# Patient Record
Sex: Female | Born: 1940 | Race: White | Hispanic: No | Marital: Married | Smoking: Current some day smoker
Health system: Southern US, Community
[De-identification: ages and names within clinical notes are randomized; demographics above are authoritative.]

## PROBLEM LIST (undated history)

## (undated) DIAGNOSIS — J309 Allergic rhinitis, unspecified: Secondary | ICD-10-CM

## (undated) DIAGNOSIS — T7840XA Allergy, unspecified, initial encounter: Secondary | ICD-10-CM

## (undated) DIAGNOSIS — K219 Gastro-esophageal reflux disease without esophagitis: Secondary | ICD-10-CM

## (undated) DIAGNOSIS — F419 Anxiety disorder, unspecified: Secondary | ICD-10-CM

## (undated) DIAGNOSIS — M1990 Unspecified osteoarthritis, unspecified site: Secondary | ICD-10-CM

## (undated) DIAGNOSIS — E785 Hyperlipidemia, unspecified: Secondary | ICD-10-CM

## (undated) DIAGNOSIS — B029 Zoster without complications: Secondary | ICD-10-CM

## (undated) DIAGNOSIS — I10 Essential (primary) hypertension: Secondary | ICD-10-CM

## (undated) HISTORY — DX: Hyperlipidemia, unspecified: E78.5

## (undated) HISTORY — PX: POLYPECTOMY: SHX149

## (undated) HISTORY — DX: Unspecified osteoarthritis, unspecified site: M19.90

## (undated) HISTORY — PX: CARDIAC CATHETERIZATION: SHX172

## (undated) HISTORY — DX: Zoster without complications: B02.9

## (undated) HISTORY — DX: Gastro-esophageal reflux disease without esophagitis: K21.9

## (undated) HISTORY — DX: Allergy, unspecified, initial encounter: T78.40XA

## (undated) HISTORY — DX: Essential (primary) hypertension: I10

## (undated) HISTORY — DX: Anxiety disorder, unspecified: F41.9

---

## 1985-10-26 HISTORY — PX: TOTAL ABDOMINAL HYSTERECTOMY: SHX209

## 1999-10-27 HISTORY — PX: BREAST LUMPECTOMY: SHX2

## 2004-10-26 HISTORY — PX: CORONARY ARTERY BYPASS GRAFT: SHX141

## 2005-07-22 ENCOUNTER — Emergency Department (HOSPITAL_COMMUNITY): Admission: EM | Admit: 2005-07-22 | Discharge: 2005-07-22 | Payer: Self-pay | Admitting: Emergency Medicine

## 2007-10-27 HISTORY — PX: BUNIONECTOMY: SHX129

## 2009-10-21 ENCOUNTER — Encounter: Admission: RE | Admit: 2009-10-21 | Discharge: 2009-10-21 | Payer: Self-pay | Admitting: General Surgery

## 2009-10-26 DIAGNOSIS — C801 Malignant (primary) neoplasm, unspecified: Secondary | ICD-10-CM

## 2009-10-26 HISTORY — DX: Malignant (primary) neoplasm, unspecified: C80.1

## 2009-10-30 ENCOUNTER — Encounter: Admission: RE | Admit: 2009-10-30 | Discharge: 2009-10-30 | Payer: Self-pay | Admitting: General Surgery

## 2009-11-07 ENCOUNTER — Encounter: Admission: RE | Admit: 2009-11-07 | Discharge: 2009-11-07 | Payer: Self-pay | Admitting: General Surgery

## 2009-11-07 ENCOUNTER — Ambulatory Visit (HOSPITAL_BASED_OUTPATIENT_CLINIC_OR_DEPARTMENT_OTHER): Admission: RE | Admit: 2009-11-07 | Discharge: 2009-11-07 | Payer: Self-pay | Admitting: General Surgery

## 2009-11-07 HISTORY — PX: BREAST LUMPECTOMY: SHX2

## 2009-11-19 ENCOUNTER — Ambulatory Visit: Payer: Self-pay | Admitting: Hematology and Oncology

## 2011-01-11 LAB — POCT I-STAT, CHEM 8
BUN: 16 mg/dL (ref 6–23)
Hemoglobin: 13.6 g/dL (ref 12.0–15.0)
TCO2: 25 mmol/L (ref 0–100)

## 2011-01-11 LAB — GLUCOSE, CAPILLARY: Glucose-Capillary: 86 mg/dL (ref 70–99)

## 2013-03-27 ENCOUNTER — Encounter: Payer: Self-pay | Admitting: Internal Medicine

## 2013-03-27 DIAGNOSIS — D0590 Unspecified type of carcinoma in situ of unspecified breast: Secondary | ICD-10-CM

## 2013-03-27 DIAGNOSIS — Z79811 Long term (current) use of aromatase inhibitors: Secondary | ICD-10-CM

## 2017-10-26 HISTORY — PX: COLONOSCOPY: SHX174

## 2019-02-08 ENCOUNTER — Telehealth: Payer: Self-pay | Admitting: Gastroenterology

## 2019-02-08 NOTE — Telephone Encounter (Signed)
Dr. Rush Landmark, pt requested you to perform her colonoscopy because her husband, Jenny Reichmann is also your patient.  Records from pt's previous colonoscopy in 2019 will be sent to you for review.

## 2019-02-09 NOTE — Telephone Encounter (Signed)
I have reviewed the patient's documentation/referral.  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  The cecum was identified by the appendiceal orifice and ileocecal valve.  The overall preparation of the colon was good.  The procedure was somewhat difficult due to sharp angulation in the sigmoid colon.  However with gentle pressure this area was able to be new initiated.  In the rectum there were internal hemorrhoids.  In addition to diminutive polyps removed with biopsy forceps were removed from the rectum.  2 similar polyps were removed from the distal sigmoid colon.  Slightly proximal to this area in the sigmoid colon at approximately 23 cm there was an area of irregularity and increased vascularity.  It was not clear if this was a polyp so the area proximal to this was injected with saline to raise the lesion.  It was still not clear that this was a polyp so biopsies were taken.  The descending/transverse/a sending colon were normal.  The cecum was normal.  12/18 Cologuard positive test  Based on the patient's findings it is not clear to me what the size of this area is however at 23 cm where the patient had biopsies obtained there was both hyperplastic and adenomatous polyp.  As such I would like to reperform a colonoscopy on this patient in the next 3 to 6 months to evaluate the region and potentially proceed with polypectomy.  Is not clear to me that she would need to have an advanced polyp resection in the hospital but we will plan to proceed with an [place] colonoscopy in this time.  For a 60-minute slot.  If it is determined that the patient needs to have a larger resection then I will schedule as an EMR for a 90-minute slot in the hospital thereafter but we will proceed  with her being done in the [facility].

## 2019-04-27 NOTE — Telephone Encounter (Signed)
Called pt twice to attempt to schedule--pt reported that her husband just had surgery and requested to schedule later in the year.  We will call pt back in August.

## 2019-05-26 NOTE — Telephone Encounter (Signed)
Patient does not wish to schedule at the moment.

## 2020-04-09 ENCOUNTER — Encounter: Payer: Self-pay | Admitting: Gastroenterology

## 2020-06-04 ENCOUNTER — Encounter: Payer: Self-pay | Admitting: Gastroenterology

## 2020-06-04 ENCOUNTER — Ambulatory Visit (AMBULATORY_SURGERY_CENTER): Payer: Self-pay

## 2020-06-04 ENCOUNTER — Other Ambulatory Visit: Payer: Self-pay

## 2020-06-04 VITALS — Ht 62.0 in | Wt 152.0 lb

## 2020-06-04 DIAGNOSIS — Z86010 Personal history of colonic polyps: Secondary | ICD-10-CM

## 2020-06-04 NOTE — Progress Notes (Signed)
No egg or soy allergy known to patient  No issues with past sedation with any surgeries or procedures no intubation problems in the past  No FH of Malignant Hyperthermia No diet pills per patient No home 02 use per patient  No blood thinners per patient  Pt denies issues with constipation  No A fib or A flutter  EMMI video to pt or via [place] 19 guidelines implemented in PV today with Pt and RN   Pt is vaccinated for covid.  Pt is a smoker, instructed to not smoke for 3 hours before procedure.    Pt has numerous questions about drinking "that much" liquid, strategies given, instructed pt to start clear liquid diet at 5 pm on 8/22 will also help.  Due to the COVID-19 pandemic we are asking patients to follow these guidelines. Please only bring one care partner. Please be aware that your care partner may wait in the car in the parking lot or if they feel like they will be too hot to wait in the car, they may wait in the lobby on the 4th floor. All care partners are required to wear a mask the entire time (we do not have any that we can provide them), they need to practice social distancing, and we will do a Covid check for all patient's and care partners when you arrive. Also we will check their temperature and your temperature. If the care partner waits in their car they need to stay in the parking lot the entire time and we will call them on their cell phone when the patient is ready for discharge so they can bring the car to the front of the building. Also all patient's will need to wear a mask into building.

## 2020-06-17 ENCOUNTER — Telehealth: Payer: Self-pay | Admitting: Gastroenterology

## 2020-06-17 NOTE — Telephone Encounter (Signed)
Patient having trouble drinking water post Miralax prep.  Recommended she take a break, re-attempt to sip it down in an hour.  Patient agreed.

## 2020-06-17 NOTE — Telephone Encounter (Signed)
Pt is requesting a call back from a nurse to discuss some complications regarding her prep prior to her colonoscopy scheduled for tomorrow am.

## 2020-06-18 ENCOUNTER — Encounter: Payer: Self-pay | Admitting: Gastroenterology

## 2020-06-18 ENCOUNTER — Other Ambulatory Visit: Payer: Self-pay

## 2020-06-18 ENCOUNTER — Ambulatory Visit (AMBULATORY_SURGERY_CENTER): Payer: Medicare Other | Admitting: Gastroenterology

## 2020-06-18 VITALS — BP 128/71 | HR 71 | Temp 96.6°F | Resp 15 | Ht 62.0 in | Wt 152.0 lb

## 2020-06-18 DIAGNOSIS — Z86010 Personal history of colonic polyps: Secondary | ICD-10-CM

## 2020-06-18 DIAGNOSIS — K635 Polyp of colon: Secondary | ICD-10-CM

## 2020-06-18 DIAGNOSIS — K621 Rectal polyp: Secondary | ICD-10-CM

## 2020-06-18 DIAGNOSIS — D128 Benign neoplasm of rectum: Secondary | ICD-10-CM

## 2020-06-18 DIAGNOSIS — D127 Benign neoplasm of rectosigmoid junction: Secondary | ICD-10-CM

## 2020-06-18 DIAGNOSIS — D124 Benign neoplasm of descending colon: Secondary | ICD-10-CM

## 2020-06-18 MED ORDER — SODIUM CHLORIDE 0.9 % IV SOLN: 500.0000 mL | Freq: Once | INTRAVENOUS | Status: DC

## 2020-06-18 NOTE — Progress Notes (Signed)
Called to room to assist during endoscopic procedure.  Patient ID and intended procedure confirmed with present staff. Received instructions for my participation in the procedure from the performing physician.  

## 2020-06-18 NOTE — Progress Notes (Signed)
Pt's states no medical or surgical changes since previsit or office visit. 

## 2020-06-18 NOTE — Op Note (Signed)
[place] Patient Name: Brandi Mcmahon Procedure Date: 06/18/2020 8:46 AM MRN: [id number] Endoscopist: Justice Britain , MD Age: 79 Referring MD:  Date of Birth: May 27, 1941 Gender: Female Account #: [id number] Procedure:                Colonoscopy Indications:              High risk colon cancer surveillance: Personal                            history of colonic polyps Medicines:                Monitored Anesthesia Care Procedure:                Pre-Anesthesia Assessment:                           - Prior to the procedure, a History and Physical                            was performed, and patient medications and                            allergies were reviewed. The patient's tolerance of                            previous anesthesia was also reviewed. The risks                            and benefits of the procedure and the sedation                            options and risks were discussed with the patient.                            All questions were answered, and informed consent                            was obtained. Prior Anticoagulants: The patient has                            taken no previous anticoagulant or antiplatelet                            agents except for aspirin. ASA Grade Assessment:                            III - A patient with severe systemic disease. After                            reviewing the risks and benefits, the patient was                            deemed in satisfactory condition to undergo the  procedure.                           After obtaining informed consent, the colonoscope                            was passed under direct vision. Throughout the                            procedure, the patient's blood pressure, pulse, and                            oxygen saturations were monitored continuously. The                            Colonoscope was introduced through the anus and                             advanced to the 5 cm into the ileum. The                            colonoscopy was performed without difficulty. The                            patient tolerated the procedure. The quality of the                            bowel preparation was adequate. The terminal ileum,                            ileocecal valve, appendiceal orifice, and rectum                            were photographed. Scope In: 9:05:54 AM Scope Out: 9:37:08 AM Scope Withdrawal Time: 0 hours 25 minutes 28 seconds  Total Procedure Duration: 0 hours 31 minutes 14 seconds  Findings:                 The digital rectal exam findings include                            hemorrhoids. Pertinent negatives include no                            palpable rectal lesions.                           The terminal ileum and ileocecal valve appeared                            normal.                           Two sessile polyps were found in the descending  colon. The polyps were 3 to 4 mm in size. These                            polyps were removed with a cold snare. Resection                            and retrieval were complete.                           Many sessile polyps were found in the rectum and                            recto-sigmoid colon - consistent with likely                            hyperplastic polyps. The polyps were 2 to 5 mm in                            size. Five of these polyps were removed with a cold                            snare for sampling purposes. Resection and                            retrieval were complete.                           A 30 mm polyp was found in the recto-sigmoid colon                            (approximately 20 cm from anal canal). The polyp                            was semi-sessile. Resection not attempted due to                            need for hospital-based EMR techniques/tools. Area                            distal to the  polyp was tattooed with an injection                            of Spot (carbon black) for demarcation purposes.                           Non-bleeding non-thrombosed internal hemorrhoids                            were found during retroflexion, during perianal                            exam and during digital exam. The hemorrhoids were  Grade II (internal hemorrhoids that prolapse but                            reduce spontaneously). Complications:            No immediate complications. Estimated Blood Loss:     Estimated blood loss was minimal. Impression:               - Hemorrhoids found on digital rectal exam.                           - The examined portion of the ileum was normal.                           - Two 3 to 4 mm polyps in the descending colon,                            removed with a cold snare. Resected and retrieved.                           - Multiple hyperplastic appearing, 2 to 5 mm polyps                            in the rectum and at the recto-sigmoid colon; five                            were removed with a cold snare. Resected and                            retrieved.                           - One 30 mm polyp at the recto-sigmoid colon needs                            hospital-based EMR. Tattooed distally.                           - Non-bleeding non-thrombosed internal hemorrhoids. Recommendation:           - The patient will be observed post-procedure,                            until all discharge criteria are met.                           - Discharge patient to home.                           - Patient has a contact number available for                            emergencies. The signs and symptoms of potential  delayed complications were discussed with the                            patient. Return to normal activities tomorrow.                            Written discharge instructions were provided  to the                            patient.                           - High fiber diet.                           - Continue present medications.                           - Await pathology results.                           - Proceed with scheduling Flexible sigmoidoscopy                            with EMR attempt in next 2-3 months in                            hospital-based setting for resection attempt.                           - The findings and recommendations were discussed                            with the patient.                           - The findings and recommendations were discussed                            with the patient's family. Justice Britain, MD 06/18/2020 9:49:35 AM

## 2020-06-18 NOTE — Progress Notes (Signed)
To PACU, VSS. Report to [email or web address] 

## 2020-06-18 NOTE — Patient Instructions (Signed)
Please read handout provided. Continue present medications. Await pathology results. High fiber diet.     YOU HAD AN ENDOSCOPIC PROCEDURE TODAY AT [facility]:   Refer to the procedure report that was given to you for any specific questions about what was found during the examination.  If the procedure report does not answer your questions, please call your gastroenterologist to clarify.  If you requested that your care partner not be given the details of your procedure findings, then the procedure report has been included in a sealed envelope for you to review at your convenience later.  YOU SHOULD EXPECT: Some feelings of bloating in the abdomen. Passage of more gas than usual.  Walking can help get rid of the air that was put into your GI tract during the procedure and reduce the bloating. If you had a lower endoscopy (such as a colonoscopy or flexible sigmoidoscopy) you may notice spotting of blood in your stool or on the toilet paper. If you underwent a bowel prep for your procedure, you may not have a normal bowel movement for a few days.  Please Note:  You might notice some irritation and congestion in your nose or some drainage.  This is from the oxygen used during your procedure.  There is no need for concern and it should clear up in a day or so.  SYMPTOMS TO REPORT IMMEDIATELY:   Following lower endoscopy (colonoscopy or flexible sigmoidoscopy):  Excessive amounts of blood in the stool  Significant tenderness or worsening of abdominal pains  Swelling of the abdomen that is new, acute  Fever of 100F or higher   For urgent or emergent issues, a gastroenterologist can be reached at any hour by calling [phone]. Do not use MyChart messaging for urgent concerns.    DIET:  We do recommend a small meal at first, but then you may proceed to your regular diet.  Drink plenty of fluids but you should avoid alcoholic beverages for 24 hours.  ACTIVITY:  You should  plan to take it easy for the rest of today and you should NOT DRIVE or use heavy machinery until tomorrow (because of the sedation medicines used during the test).    FOLLOW UP: Our staff will call the number listed on your records 48-72 hours following your procedure to check on you and address any questions or concerns that you may have regarding the information given to you following your procedure. If we do not reach you, we will leave a message.  We will attempt to reach you two times.  During this call, we will ask if you have developed any symptoms of COVID 19. If you develop any symptoms (ie: fever, flu-like symptoms, shortness of breath, cough etc.) before then, please call [phone].  If you test positive for Covid 19 in the 2 weeks post procedure, please call and report this information to [place].    If any biopsies were taken you will be contacted by phone or by letter within the next 1-3 weeks.  Please call us at [phone] if you have not heard about the biopsies in 3 weeks.    SIGNATURES/CONFIDENTIALITY: You and/or your care partner have signed paperwork which will be entered into your electronic medical record.  These signatures attest to the fact that that the information above on your After Visit Summary has been reviewed and is understood.  Full responsibility of the confidentiality of this discharge information lies with you and/or your care-partner.

## 2020-06-20 ENCOUNTER — Telehealth: Payer: Self-pay

## 2020-06-20 NOTE — Telephone Encounter (Signed)
  Follow up Call-  Call back number 06/18/2020  Post procedure Call Back phone  # [phone]  Permission to leave phone message Yes  Some recent data might be hidden     Left message

## 2020-06-20 NOTE — Telephone Encounter (Signed)
  Follow up Call-  Call back number 06/18/2020  Post procedure Call Back phone  # [phone]  Permission to leave phone message Yes  Some recent data might be hidden     Left Message

## 2020-07-04 ENCOUNTER — Encounter: Payer: Self-pay | Admitting: Gastroenterology

## 2020-09-03 ENCOUNTER — Telehealth: Payer: Self-pay

## 2020-09-03 NOTE — Telephone Encounter (Signed)
Per recall the pt needs to have Colon at the hospital for colon screen polyps with Dr Rush Landmark.  Let Dr Rush Landmark know the date.

## 2020-09-04 ENCOUNTER — Other Ambulatory Visit: Payer: Self-pay

## 2020-09-04 DIAGNOSIS — Z86010 Personal history of colonic polyps: Secondary | ICD-10-CM

## 2020-09-04 NOTE — Telephone Encounter (Signed)
Colon scheduled, pt instructed and medications reviewed.  Patient instructions mailed to home.  Patient to call with any questions or concerns.  

## 2020-09-04 NOTE — Telephone Encounter (Signed)
Left message on machine to call back  

## 2020-09-04 NOTE — Telephone Encounter (Signed)
Pt returning call, please call to sch procedure for hospital

## 2020-09-04 NOTE — Telephone Encounter (Signed)
Colon scheduled for 10/23/20 at 10 am at [facility] with Dr Rush Landmark  COVID test on 12/27

## 2020-10-14 ENCOUNTER — Other Ambulatory Visit: Payer: Self-pay

## 2020-10-14 ENCOUNTER — Encounter (HOSPITAL_COMMUNITY): Payer: Self-pay | Admitting: Gastroenterology

## 2020-10-21 ENCOUNTER — Other Ambulatory Visit (HOSPITAL_COMMUNITY)
Admission: RE | Admit: 2020-10-21 | Discharge: 2020-10-21 | Disposition: A | Discharge status: Home / Self Care | Payer: Medicare Other | Source: Ambulatory Visit | Attending: Gastroenterology | Admitting: Gastroenterology

## 2020-10-21 DIAGNOSIS — Z01812 Encounter for preprocedural laboratory examination: Secondary | ICD-10-CM | POA: Diagnosis present

## 2020-10-21 DIAGNOSIS — Z20822 Contact with and (suspected) exposure to covid-19: Secondary | ICD-10-CM | POA: Diagnosis not present

## 2020-10-21 LAB — SARS CORONAVIRUS 2 (TAT 6-24 HRS): SARS Coronavirus 2: NEGATIVE

## 2020-10-23 ENCOUNTER — Ambulatory Visit (HOSPITAL_COMMUNITY)
Admission: RE | Admit: 2020-10-23 | Discharge: 2020-10-23 | Disposition: A | Discharge status: Home / Self Care | Payer: Medicare Other | Attending: Gastroenterology | Admitting: Gastroenterology

## 2020-10-23 ENCOUNTER — Ambulatory Visit (HOSPITAL_COMMUNITY): Payer: Medicare Other | Admitting: Anesthesiology

## 2020-10-23 ENCOUNTER — Other Ambulatory Visit: Payer: Self-pay

## 2020-10-23 ENCOUNTER — Encounter (HOSPITAL_COMMUNITY)
Admission: RE | Disposition: A | Discharge status: Home / Self Care | Payer: Self-pay | Source: Home / Self Care | Attending: Gastroenterology

## 2020-10-23 ENCOUNTER — Encounter (HOSPITAL_COMMUNITY): Payer: Self-pay | Admitting: Gastroenterology

## 2020-10-23 DIAGNOSIS — Z86010 Personal history of colonic polyps: Secondary | ICD-10-CM

## 2020-10-23 DIAGNOSIS — K635 Polyp of colon: Secondary | ICD-10-CM | POA: Insufficient documentation

## 2020-10-23 DIAGNOSIS — Z853 Personal history of malignant neoplasm of breast: Secondary | ICD-10-CM | POA: Diagnosis not present

## 2020-10-23 DIAGNOSIS — Z79899 Other long term (current) drug therapy: Secondary | ICD-10-CM | POA: Diagnosis not present

## 2020-10-23 DIAGNOSIS — Q438 Other specified congenital malformations of intestine: Secondary | ICD-10-CM | POA: Diagnosis not present

## 2020-10-23 DIAGNOSIS — D127 Benign neoplasm of rectosigmoid junction: Secondary | ICD-10-CM | POA: Diagnosis not present

## 2020-10-23 DIAGNOSIS — K621 Rectal polyp: Secondary | ICD-10-CM | POA: Diagnosis not present

## 2020-10-23 DIAGNOSIS — K5730 Diverticulosis of large intestine without perforation or abscess without bleeding: Secondary | ICD-10-CM | POA: Diagnosis not present

## 2020-10-23 DIAGNOSIS — Z951 Presence of aortocoronary bypass graft: Secondary | ICD-10-CM | POA: Diagnosis not present

## 2020-10-23 DIAGNOSIS — K644 Residual hemorrhoidal skin tags: Secondary | ICD-10-CM | POA: Insufficient documentation

## 2020-10-23 DIAGNOSIS — F17210 Nicotine dependence, cigarettes, uncomplicated: Secondary | ICD-10-CM | POA: Insufficient documentation

## 2020-10-23 DIAGNOSIS — K641 Second degree hemorrhoids: Secondary | ICD-10-CM | POA: Diagnosis not present

## 2020-10-23 HISTORY — PX: SUBMUCOSAL LIFTING INJECTION: SHX6855

## 2020-10-23 HISTORY — PX: POLYPECTOMY: SHX5525

## 2020-10-23 HISTORY — PX: COLONOSCOPY WITH PROPOFOL: SHX5780

## 2020-10-23 HISTORY — PX: SUBMUCOSAL TATTOO INJECTION: SHX6856

## 2020-10-23 HISTORY — PX: HEMOSTASIS CLIP PLACEMENT: SHX6857

## 2020-10-23 SURGERY — COLONOSCOPY WITH PROPOFOL
Anesthesia: Monitor Anesthesia Care

## 2020-10-23 MED ORDER — SODIUM CHLORIDE 0.9 % IV SOLN: INTRAVENOUS | Status: DC

## 2020-10-23 MED ORDER — LACTATED RINGERS IV SOLN: INTRAVENOUS | Status: DC

## 2020-10-23 MED ORDER — SPOT INK MARKER SYRINGE KIT
PACK | SUBMUCOSAL | Status: DC | PRN
Start: 2020-10-23 — End: 2020-10-23
  Administered 2020-10-23: 2 mL via SUBMUCOSAL

## 2020-10-23 MED ORDER — PROPOFOL 500 MG/50ML IV EMUL
INTRAVENOUS | Status: DC | PRN
  Administered 2020-10-23: 50 ug/kg/min via INTRAVENOUS
  Administered 2020-10-23: 50 mg via INTRAVENOUS

## 2020-10-23 MED ORDER — SPOT INK MARKER SYRINGE KIT
PACK | SUBMUCOSAL | Status: AC
  Filled 2020-10-23: qty 5

## 2020-10-23 SURGICAL SUPPLY — 22 items

## 2020-10-23 NOTE — Anesthesia Preprocedure Evaluation (Addendum)
Anesthesia Evaluation  Patient identified by MRN, date of birth, ID band Patient awake    Reviewed: Allergy & Precautions, H&P , NPO status , Patient's Chart, lab work & pertinent test results, reviewed documented beta blocker date and time   Airway Mallampati: I  TM Distance: >3 FB Neck ROM: full    Dental no notable dental hx. (+) Edentulous Upper, Edentulous Lower, Upper Dentures   Pulmonary COPD,  COPD inhaler, Current SmokerPatient did not abstain from smoking.,    Pulmonary exam normal breath sounds clear to auscultation       Cardiovascular Exercise Tolerance: Good hypertension, Pt. on medications negative cardio ROS   Rhythm:regular Rate:Normal     Neuro/Psych PSYCHIATRIC DISORDERS Anxiety negative neurological ROS     GI/Hepatic Neg liver ROS, GERD  Medicated,  Endo/Other  negative endocrine ROS  Renal/GU negative Renal ROS  negative genitourinary   Musculoskeletal  (+) Arthritis , Osteoarthritis,    Abdominal   Peds  Hematology negative hematology ROS (+)   Anesthesia Other Findings   Reproductive/Obstetrics negative OB ROS                            Anesthesia Physical Anesthesia Plan  ASA: III  Anesthesia Plan: MAC   Post-op Pain Management:    Induction:   PONV Risk Score and Plan: 2 and Treatment may vary due to age or medical condition  Airway Management Planned: Nasal Cannula, Natural Airway, Simple Face Mask and Mask  Additional Equipment:   Intra-op Plan:   Post-operative Plan:   Informed Consent: I have reviewed the patients History and Physical, chart, labs and discussed the procedure including the risks, benefits and alternatives for the proposed anesthesia with the patient or authorized representative who has indicated his/her understanding and acceptance.     Dental Advisory Given  Plan Discussed with: CRNA and Anesthesiologist  Anesthesia Plan  Comments:         Anesthesia Quick Evaluation

## 2020-10-23 NOTE — H&P (Signed)
GASTROENTEROLOGY PROCEDURE H&P NOTE   Primary Care Physician: Joya Salm  HPI: Brandi Mcmahon is a 79 y.o. female who presents for Colonoscopy with attempt at polyp resection/mucosal resection.  Past Medical History:  Diagnosis Date  . Allergy    seasonal and environmental  . Anxiety   . Arthritis   . 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 GERD (gastroesophageal reflux disease)   . Hyperlipidemia   . Hypertension    Past Surgical History:  Procedure Laterality Date  . BREAST LUMPECTOMY Left 2001  . BUNIONECTOMY  2009  . CARDIAC CATHETERIZATION    . COLONOSCOPY  2019  . CORONARY ARTERY BYPASS GRAFT  2006  . POLYPECTOMY    . TOTAL ABDOMINAL HYSTERECTOMY  1987   Current Facility-Administered Medications  Medication Dose Route Frequency Provider Last Rate Last Admin  . 0.9 %  sodium chloride infusion   Intravenous Continuous Mansouraty, Netty Starring., MD       Allergies  Allergen Reactions  . Amoxicillin-Pot Clavulanate Other (See Comments)    Unknown ABDONIMAL PAIN   . Aspirin Other (See Comments)    Unknown Other Reaction: Other reaction   . Clavulanic Acid Hives  . Codeine Hives and Other (See Comments)    Unknown   . Alprazolam Other (See Comments)    Unknown OVER SEDATES   . Bee Venom Hives  . Moxifloxacin Hcl In Nacl Rash  . Sulfa Antibiotics Other (See Comments) and Rash    Unknown   . Hydrocodone-Acetaminophen Anxiety and Other (See Comments)    Unknown    Family History  Problem Relation Age of Onset  . Colon cancer Neg Hx   . Colon polyps Neg Hx   . Esophageal cancer Neg Hx   . Rectal cancer Neg Hx   . Stomach cancer Neg Hx    Social History   Socioeconomic History  . Marital status: Married    Spouse name: Not on file  . Number of children: Not on file  . Years of education: Not on file  . Highest education level: Not on file  Occupational History  . Not on file  Tobacco Use  . Smoking  status: Current Some Day Smoker    Types: Cigarettes  . Smokeless tobacco: Never Used  . Tobacco comment: 1-2 per day, trying to quit  Vaping Use  . Vaping Use: Never used  Substance and Sexual Activity  . Alcohol use: Not Currently  . Drug use: Never  . Sexual activity: Not on file  Other Topics Concern  . Not on file  Social History Narrative  . Not on file   Social Determinants of Health   Financial Resource Strain: Not on file  Food Insecurity: Not on file  Transportation Needs: Not on file  Physical Activity: Not on file  Stress: Not on file  Social Connections: Not on file  Intimate Partner Violence: Not on file    Physical Exam: Vital signs in last 24 hours: Temp:  [97.9 F (36.6 C)] 97.9 F (36.6 C) (12/29 0738) Pulse Rate:  [75] 75 (12/29 0738) Resp:  [20] 20 (12/29 0738) BP: (121)/(79) 121/79 (12/29 0738) SpO2:  [98 %] 98 % (12/29 0738) Weight:  [68.9 kg] 68.9 kg (12/29 0738)   GEN: NAD EYE: Sclerae anicteric ENT: MMM CV: Non-tachycardic GI: Soft, NT/ND NEURO:  Alert & Oriented x 3  Lab Results: No results for input(s): WBC, HGB, HCT, PLT in the last 72  hours. BMET No results for input(s): NA, K, CL, CO2, GLUCOSE, BUN, CREATININE, CALCIUM in the last 72 hours. LFT No results for input(s): PROT, ALBUMIN, AST, ALT, ALKPHOS, BILITOT, BILIDIR, IBILI in the last 72 hours. PT/INR No results for input(s): LABPROT, INR in the last 72 hours.   Impression / Plan: This is a 79 y.o.female  who presents for Colonoscopy with attempt at polyp resection/mucosal resection.  Based upon the description and endoscopic pictures I do feel that it is reasonable to pursue an Advanced Polypectomy attempt of the polyp/lesion.  We discussed some of the techniques of advanced polypectomy which include Endoscopic Mucosal Resection, OVESCO Full-Thickness Resection, Endorotor Morcellation, and Tissue Ablation via Fulguration.  We also reviewed images of typical techniques as noted  above.  The risks and benefits of endoscopic evaluation were discussed with the patient; these include but are not limited to the risk of perforation, infection, bleeding, missed lesions, lack of diagnosis, severe illness requiring hospitalization, as well as anesthesia and sedation related illnesses.  During attempts at advanced resection, the risks of bleeding and perforation/leak are increased as opposed to diagnostic and screening procedures, and that was discussed with the patient as well.   In addition, I explained that with the possible need for piecemeal resection, subsequent short-interval endoscopic evaluation for follow up and potential retreatment of the lesion/area may be necessary.  I did offer, a referral to surgery in order for patient to have opportunity to discuss surgical management/intervention prior to finalizing decision for attempt at endoscopic removal, however, the patient deferred on this.  If, after attempt at removal of the polyp/lesion, it is found that the patient has a complication or that an invasive lesion or malignant lesion is found, or that the polyp/lesion continues to recur, the patient is aware and understands that surgery may still be indicated/required.  All patient questions were answered, to the best of my ability, and the patient agrees to the aforementioned plan of action with follow-up as indicated.  The risks and benefits of endoscopic evaluation were discussed with the patient; these include but are not limited to the risk of perforation, infection, bleeding, missed lesions, lack of diagnosis, severe illness requiring hospitalization, as well as anesthesia and sedation related illnesses.  The patient is agreeable to proceed.    Justice Britain, MD [facility] Advanced Endoscopy Office # [phone]

## 2020-10-23 NOTE — Op Note (Signed)
[facility] Patient Name: Brandi Mcmahon Procedure Date: 10/23/2020 MRN: [id number] Attending MD: Justice Britain , MD Date of Birth: [birth year] CSN: [id number] Age: 79 Admit Type: Inpatient Procedure:                Colonoscopy Indications:              Excision of colonic polyp Providers:                Justice Britain, MD, Jeanella Cara, RN,                            Ladona Ridgel, Technician, Eliberto Ivory CRNA Referring MD:             Elyn Aquas MD, MD Medicines:                Monitored Anesthesia Care Complications:            No immediate complications. Estimated Blood Loss:     Estimated blood loss was minimal. Procedure:                Pre-Anesthesia Assessment:                           - Prior to the procedure, a History and Physical                            was performed, and patient medications and                            allergies were reviewed. The patient's tolerance of                            previous anesthesia was also reviewed. The risks                            and benefits of the procedure and the sedation                            options and risks were discussed with the patient.                            All questions were answered, and informed consent                            was obtained. Prior Anticoagulants: The patient has                            taken no previous anticoagulant or antiplatelet                            agents except for aspirin. ASA Grade Assessment:                            III - A patient with severe systemic disease. After  reviewing the risks and benefits, the patient was                            deemed in satisfactory condition to undergo the                            procedure.                           After obtaining informed consent, the colonoscope                            was passed under direct vision. Throughout the                             procedure, the patient's blood pressure, pulse, and                            oxygen saturations were monitored continuously. The                            PCF-H190DL ([id number] Olympus pediatric colonscope                            was introduced through the anus and advanced to the                            5 cm into the ileum. The colonoscopy was somewhat                            difficult due to a tortuous colon. Successful                            completion of the procedure was aided by changing                            the patient's position, using manual pressure,                            withdrawing and reinserting the scope,                            straightening and shortening the scope to obtain                            bowel loop reduction and using scope torsion. The                            patient tolerated the procedure. The quality of the                            bowel preparation was adequate. The terminal ileum,  ileocecal valve, appendiceal orifice, and rectum                            were photographed. Scope In: 9:13:31 AM Scope Out: 9:49:16 AM Scope Withdrawal Time: 0 hours 29 minutes 7 seconds  Total Procedure Duration: 0 hours 35 minutes 45 seconds  Findings:      The digital rectal exam findings include hemorrhoids. Pertinent       negatives include no palpable rectal lesions.      The terminal ileum and ileocecal valve appeared normal.      Multiple sessile polyps were found in the rectum, recto-sigmoid colon,       sigmoid colon and descending colon - consistent with hyperplastic polyps       endoscopically. The polyps were 2 to 5 mm in size. A few of these polyps       were removed with a cold snare for sampling purposes.      A 28 mm polyp was found in the recto-sigmoid colon (between 15-20 cm       from anal verge). The polyp was semi-sessile. Preparations were made for       mucosal resection. NBI imaging  and White-light endoscopy was done to       demarcate the borders of the lesion. Orise gel was injected to raise the       lesion. Piecemeal mucosal resection using a cold snare was performed.       Resection and retrieval were complete. To prevent bleeding after mucosal       resection, five hemostatic clips were successfully placed (MR       conditional). There was no bleeding at the end of the procedure. I could       not visualize the previously placed tattoo, so for marking purposes, the       area just distal to resection was tattooed with an injection of Spot       (carbon black).      Normal mucosa was found in the entire colon otherwise.      Multiple small-mouthed diverticula were found in the recto-sigmoid       colon, sigmoid colon and descending colon.      The right colon was significantly tortuous as a result of diverticular       disease.      Non-bleeding non-thrombosed external and internal hemorrhoids were found       during retroflexion, during perianal exam and during digital exam. The       hemorrhoids were Grade II (internal hemorrhoids that prolapse but reduce       spontaneously). Impression:               - Hemorrhoids found on digital rectal exam.                           - The examined portion of the ileum was normal.                           - Multiple 2 to 5 mm polyps in the rectum, at the                            recto-sigmoid colon, in the sigmoid colon and in  the descending colon - consistent with hyperplastic                            polyps endoscopically, a few were sampled with a                            cold snare.                           - One 28 mm polyp at the recto-sigmoid colon,                            removed with piecemeal cold mucosal resection.                            Resected and retrieved. Clips (MR conditional) were                            placed. Tattooed distally.                           -  Normal mucosa in the entire examined colon                            otherwise.                           - Diverticulosis in the recto-sigmoid colon, in the                            sigmoid colon and in the descending colon leading                            to tortuosity in the right colon.                           - Non-bleeding non-thrombosed external and internal                            hemorrhoids. Moderate Sedation:      Not Applicable - Patient had care per Anesthesia. Recommendation:           - The patient will be observed post-procedure,                            until all discharge criteria are met.                           - Discharge patient to home.                           - Patient has a contact number available for                            emergencies. The signs and symptoms of potential  delayed complications were discussed with the                            patient. Return to normal activities tomorrow.                            Written discharge instructions were provided to the                            patient.                           - High fiber diet.                           - Use FiberCon 1-2 tablets PO daily.                           - No aspirin, ibuprofen, naproxen, or other                            non-steroidal anti-inflammatory drugs for 2 weeks                            after polyp removal to decrease risk of                            post-interventional bleeding.                           - Await pathology results.                           - Repeat colonoscopy in 1 year for surveillance                            after piecemeal polypectomy. If this polyp returns                            as hyperplastic, then we can have discussion as to                            whether repeat evaluation is required (when I                            sampled this polyp the first time it returned as a                             hyperplastic polyp though pit pattern was more                            suggestive of an adenomatous polyp).                           - The findings and recommendations were discussed  with the patient.                           - The findings and recommendations were discussed                            with the patient's family. Procedure Code(s):        --- Professional ---                           [phone], Colonoscopy, flexible; with endoscopic                            mucosal resection                           45385, 26, Colonoscopy, flexible; with removal of                            tumor(s), polyp(s), or other lesion(s) by snare                            technique Diagnosis Code(s):        --- Professional ---                           K64.1, Second degree hemorrhoids                           K62.1, Rectal polyp                           K63.5, Polyp of colon                           K57.30, Diverticulosis of large intestine without                            perforation or abscess without bleeding                           Q43.8, Other specified congenital malformations of                            intestine CPT copyright 2019 [employer]. All rights reserved. The codes documented in this report are preliminary and upon coder review may  be revised to meet current compliance requirements. Justice Britain, MD 10/23/2020 10:09:05 AM Number of Addenda: 0

## 2020-10-23 NOTE — Transfer of Care (Signed)
Immediate Anesthesia Transfer of Care Note  Patient: Brandi Mcmahon  Procedure(s) Performed: Procedure(s): COLONOSCOPY WITH PROPOFOL (N/A) POLYPECTOMY SUBMUCOSAL LIFTING INJECTION HEMOSTASIS CLIP PLACEMENT SUBMUCOSAL TATTOO INJECTION  Patient Location: PACU and Endoscopy Unit  Anesthesia Type:MAC  Level of Consciousness: awake, alert  and oriented  Airway & Oxygen Therapy: Patient Spontanous Breathing and Patient connected to nasal cannula oxygen  Post-op Assessment: Report given to RN and Post -op Vital signs reviewed and stable  Post vital signs: Reviewed and stable  Last Vitals:  Vitals:   10/23/20 0738  BP: 121/79  Pulse: 75  Resp: 20  Temp: 36.6 C  SpO2: 05%    Complications: No apparent anesthesia complications

## 2020-10-23 NOTE — Discharge Instructions (Signed)
YOU HAD AN ENDOSCOPIC PROCEDURE TODAY: Refer to the procedure report and other information in the discharge instructions given to you for any specific questions about what was found during the examination. If this information does not answer your questions, please call [place] office at [phone] to clarify.  ° °YOU SHOULD EXPECT: Some feelings of bloating in the abdomen. Passage of more gas than usual. Walking can help get rid of the air that was put into your GI tract during the procedure and reduce the bloating. If you had a lower endoscopy (such as a colonoscopy or flexible sigmoidoscopy) you may notice spotting of blood in your stool or on the toilet paper. Some abdominal soreness may be present for a day or two, also. ° °DIET: Your first meal following the procedure should be a light meal and then it is ok to progress to your normal diet. A half-sandwich or bowl of soup is an example of a good first meal. Heavy or fried foods are harder to digest and may make you feel nauseous or bloated. Drink plenty of fluids but you should avoid alcoholic beverages for 24 hours. If you had a esophageal dilation, please see attached instructions for diet.   ° °ACTIVITY: Your care partner should take you home directly after the procedure. You should plan to take it easy, moving slowly for the rest of the day. You can resume normal activity the day after the procedure however YOU SHOULD NOT DRIVE, use power tools, machinery or perform tasks that involve climbing or major physical exertion for 24 hours (because of the sedation medicines used during the test).  ° °SYMPTOMS TO REPORT IMMEDIATELY: °A gastroenterologist can be reached at any hour. Please call [phone]  for any of the following symptoms:  °Following lower endoscopy (colonoscopy, flexible sigmoidoscopy) °Excessive amounts of blood in the stool  °Significant tenderness, worsening of abdominal pains  °Swelling of the abdomen that is new, acute  °Fever of 100° or  higher  ° °Black, tarry-looking or red, bloody stools ° °FOLLOW UP:  °If any biopsies were taken you will be contacted by phone or by letter within the next 1-3 weeks. Call [phone]  if you have not heard about the biopsies in 3 weeks.  °Please also call with any specific questions about appointments or follow up tests.  °

## 2020-10-23 NOTE — Anesthesia Postprocedure Evaluation (Signed)
Anesthesia Post Note  Patient: Brandi Mcmahon  Procedure(s) Performed: COLONOSCOPY WITH PROPOFOL (N/A ) POLYPECTOMY SUBMUCOSAL LIFTING INJECTION HEMOSTASIS CLIP PLACEMENT SUBMUCOSAL TATTOO INJECTION     Patient location during evaluation: PACU Anesthesia Type: MAC Level of consciousness: awake and alert Pain management: pain level controlled Vital Signs Assessment: post-procedure vital signs reviewed and stable Respiratory status: spontaneous breathing, nonlabored ventilation, respiratory function stable and patient connected to nasal cannula oxygen Cardiovascular status: stable and blood pressure returned to baseline Postop Assessment: no apparent nausea or vomiting Anesthetic complications: no   No complications documented.  Last Vitals:  Vitals:   10/23/20 0957 10/23/20 1000  BP: (!) 133/45 (!) 128/50  Pulse: 75 72  Resp: 19 20  Temp: 36.4 C   SpO2: 98% 99%    Last Pain:  Vitals:   10/23/20 1000  TempSrc:   PainSc: 0-No pain                 [name]

## 2020-10-24 LAB — SURGICAL PATHOLOGY

## 2020-10-25 ENCOUNTER — Encounter (HOSPITAL_COMMUNITY): Payer: Self-pay | Admitting: Gastroenterology

## 2020-10-27 ENCOUNTER — Encounter: Payer: Self-pay | Admitting: Gastroenterology

## 2021-05-16 ENCOUNTER — Other Ambulatory Visit: Payer: Self-pay | Admitting: Neurosurgery

## 2021-06-05 ENCOUNTER — Other Ambulatory Visit (HOSPITAL_COMMUNITY): Payer: Medicare Other

## 2021-06-12 ENCOUNTER — Other Ambulatory Visit: Payer: Self-pay | Admitting: Neurosurgery

## 2021-06-12 ENCOUNTER — Encounter (HOSPITAL_COMMUNITY): Payer: Self-pay

## 2021-06-12 ENCOUNTER — Encounter (HOSPITAL_COMMUNITY)
Admission: RE | Admit: 2021-06-12 | Discharge: 2021-06-12 | Disposition: A | Discharge status: Home / Self Care | Payer: Medicare Other | Source: Ambulatory Visit | Attending: Neurosurgery | Admitting: Neurosurgery

## 2021-06-12 ENCOUNTER — Other Ambulatory Visit: Payer: Self-pay

## 2021-06-12 DIAGNOSIS — Z01818 Encounter for other preprocedural examination: Secondary | ICD-10-CM | POA: Diagnosis not present

## 2021-06-12 HISTORY — DX: Allergic rhinitis, unspecified: J30.9

## 2021-06-12 LAB — BASIC METABOLIC PANEL
Anion gap: 11 (ref 5–15)
BUN: 21 mg/dL (ref 8–23)
CO2: 23 mmol/L (ref 22–32)
Calcium: 9.7 mg/dL (ref 8.9–10.3)
Chloride: 100 mmol/L (ref 98–111)
Creatinine, Ser: 0.84 mg/dL (ref 0.44–1.00)
GFR, Estimated: >60 mL/min (ref >60)
Glucose, Bld: 97 mg/dL (ref 70–99)
Potassium: 4.5 mmol/L (ref 3.5–5.1)
Sodium: 134 mmol/L — ABNORMAL LOW (ref 135–145)

## 2021-06-12 LAB — SURGICAL PCR SCREEN
MRSA, PCR: NEGATIVE
Staphylococcus aureus: NEGATIVE

## 2021-06-12 LAB — CBC
HCT: 43.2 % (ref 36.0–46.0)
Hemoglobin: 14.6 g/dL (ref 12.0–15.0)
MCH: 31.9 pg (ref 26.0–34.0)
MCHC: 33.8 g/dL (ref 30.0–36.0)
MCV: 94.3 fL (ref 80.0–100.0)
Platelets: 236 10*3/uL (ref 150–400)
RBC: 4.58 MIL/uL (ref 3.87–5.11)
RDW: 13.8 % (ref 11.5–15.5)
WBC: 9.7 10*3/uL (ref 4.0–10.5)
nRBC: 0 % (ref 0.0–0.2)

## 2021-06-12 NOTE — Progress Notes (Signed)
PCP - Dr. Julieta Gutting Cardiologist - denies  PPM/ICD - n/a Device Orders - n/a Rep Notified - n/a  Chest x-ray -  EKG - 06/12/21 Stress Test - denies ECHO - denies Cardiac Cath - 2004 per patient at [place] - denies CPAP - n/a  Fasting Blood Sugar - n/a Checks Blood Sugar n/a times a day  Blood Thinner Instructions: n/a Aspirin Instructions: n/a  ERAS Protcol - No PRE-SURGERY Ensure or G2- n/a  COVID TEST- Day of surgery. Ok per L. Granville Lewis, Therapist, sports. Patient states she was told to go for a COVID test on 06/12/21 prior to her PAT appointment. Patient made aware that test needs to be 4 days prior to surgery.    Anesthesia review: Yes. Records Requested from PCP Dr. Salli Real  Patient denies shortness of breath, fever, cough and chest pain at PAT appointment   All instructions explained to the patient, with a verbal understanding of the material. Patient agrees to go over the instructions while at home for a better understanding. Patient also instructed to self quarantine after being tested for COVID-19. The opportunity to ask questions was provided.

## 2021-06-12 NOTE — Pre-Procedure Instructions (Addendum)
Surgical Instructions    Your procedure is scheduled on Wednesday 06/18/21.   Report to [facility] Main Entrance "A" at 07:35 A.M., then check in with the Admitting office.  Call this number if you have problems the morning of surgery:  [phone]   If you have any questions prior to your surgery date call [phone]: Open Monday-Friday 8am-4pm    Remember:  Do not eat or drink after midnight the night before your surgery     Take these medicines the morning of surgery with A SIP OF WATER   mometasone (NASONEX)   pantoprazole (PROTONIX)  MYRBETRIQ     Take these medicines if needed:   acetaminophen (TYLENOL)  albuterol (VENTOLIN HFA) 108 (90 Base)  carboxymethylcellulose (REFRESH PLUS)   diazepam (VALIUM)  methocarbamol (ROBAXIN)     As of today, STOP taking any Aspirin (unless otherwise instructed by your surgeon) Aleve, Naproxen, Ibuprofen, Motrin, Advil, Goody's, BC's, all herbal medications, fish oil, and all vitamins.                     Do NOT Smoke (Tobacco/Vaping) or drink Alcohol 24 hours prior to your procedure.  If you use a CPAP at night, you may bring all equipment for your overnight stay.   Contacts, glasses, piercing's, hearing aid's, dentures or partials may not be worn into surgery, please bring cases for these belongings.    For patients admitted to the hospital, discharge time will be determined by your treatment team.   Patients discharged the day of surgery will not be allowed to drive home, and someone needs to stay with them for 24 hours.  ONLY 1 SUPPORT PERSON MAY BE PRESENT WHILE YOU ARE IN SURGERY. IF YOU ARE TO BE ADMITTED ONCE YOU ARE IN YOUR ROOM YOU WILL BE ALLOWED TWO (2) VISITORS.  Minor children may have two parents present. Special consideration for safety and communication needs will be reviewed on a case by case basis.   Special instructions:   - Preparing For Surgery  Before surgery, you can play an important role.  Because skin is not sterile, your skin needs to be as free of germs as possible. You can reduce the number of germs on your skin by washing with CHG (chlorahexidine gluconate) Soap before surgery.  CHG is an antiseptic cleaner which kills germs and bonds with the skin to continue killing germs even after washing.    Oral Hygiene is also important to reduce your risk of infection.  Remember - BRUSH YOUR TEETH THE MORNING OF SURGERY WITH YOUR REGULAR TOOTHPASTE  Please do not use if you have an allergy to CHG or antibacterial soaps. If your skin becomes reddened/irritated stop using the CHG.  Do not shave (including legs and underarms) for at least 48 hours prior to first CHG shower. It is OK to shave your face.  Please follow these instructions carefully.   Shower the NIGHT BEFORE SURGERY and the MORNING OF SURGERY  If you chose to wash your hair, wash your hair first as usual with your normal shampoo.  After you shampoo, rinse your hair and body thoroughly to remove the shampoo.  Use CHG Soap as you would any other liquid soap. You can apply CHG directly to the skin and wash gently with a scrungie or a clean washcloth.   Apply the CHG Soap to your body ONLY FROM THE NECK DOWN.  Do not use on open wounds or open sores. Avoid contact with your  eyes, ears, mouth and genitals (private parts). Wash Face and genitals (private parts)  with your normal soap.   Wash thoroughly, paying special attention to the area where your surgery will be performed.  Thoroughly rinse your body with warm water from the neck down.  DO NOT shower/wash with your normal soap after using and rinsing off the CHG Soap.  Pat yourself dry with a CLEAN TOWEL.  Wear CLEAN PAJAMAS to bed the night before surgery  Place CLEAN SHEETS on your bed the night before your surgery  DO NOT SLEEP WITH PETS.   Day of Surgery: Shower with CHG soap. Do not wear jewelry, make up, nail polish, gel polish, artificial nails, or any  other type of covering on natural nails including finger and toenails. If patients have artificial nails, gel coating, etc. that need to be removed by a nail salon please have this removed prior to surgery. Surgery may need to be canceled/delayed if the surgeon/ anesthesia feels like the patient is unable to be adequately monitored. Do not wear lotions, powders, perfumes/colognes, or deodorant. Do not shave 48 hours prior to surgery.  Men may shave face and neck. Do not bring valuables to the hospital. [facility] is not responsible for any belongings or valuables. Wear Clean/Comfortable clothing the morning of surgery Remember to brush your teeth WITH YOUR REGULAR TOOTHPASTE.   Please read over the following fact sheets that you were given.

## 2021-06-13 ENCOUNTER — Encounter (HOSPITAL_COMMUNITY): Payer: Self-pay

## 2021-06-13 LAB — SARS CORONAVIRUS 2 (TAT 6-24 HRS): SARS Coronavirus 2: NEGATIVE

## 2021-06-13 NOTE — Anesthesia Preprocedure Evaluation (Addendum)
Anesthesia Evaluation  Patient identified by MRN, date of birth, ID band Patient awake    Reviewed: Allergy & Precautions, NPO status , Patient's Chart, lab work & pertinent test results  History of Anesthesia Complications Negative for: history of anesthetic complications  Airway Mallampati: I  TM Distance: >3 FB Neck ROM: Full    Dental  (+) Edentulous Upper, Edentulous Lower   Pulmonary COPD,  COPD inhaler, Current Smoker and Patient abstained from smoking.,  06/16/2021 SARS coronavirus NEG   breath sounds clear to auscultation       Cardiovascular hypertension, Pt. on medications (-) angina Rhythm:Regular Rate:Normal     Neuro/Psych Anxiety Chronic back pain : narcotics    GI/Hepatic Neg liver ROS, GERD  Medicated and Controlled,  Endo/Other  negative endocrine ROS  Renal/GU negative Renal ROS     Musculoskeletal  (+) Arthritis ,   Abdominal   Peds  Hematology negative hematology ROS (+)   Anesthesia Other Findings Breast cancer  Reproductive/Obstetrics                           Anesthesia Physical Anesthesia Plan  ASA: 3  Anesthesia Plan: General   Post-op Pain Management:    Induction: Intravenous  PONV Risk Score and Plan: 2 and Dexamethasone and Ondansetron  Airway Management Planned: Oral ETT  Additional Equipment: None  Intra-op Plan:   Post-operative Plan: Extubation in OR  Informed Consent: I have reviewed the patients History and Physical, chart, labs and discussed the procedure including the risks, benefits and alternatives for the proposed anesthesia with the patient or authorized representative who has indicated his/her understanding and acceptance.     Dental advisory given  Plan Discussed with: CRNA and Surgeon  Anesthesia Plan Comments: (PAT note written 06/13/2021 by Myra Gianotti, PA-C. )      Anesthesia Quick Evaluation
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
## 2021-06-16 ENCOUNTER — Other Ambulatory Visit: Payer: Self-pay | Admitting: Neurosurgery

## 2021-06-16 NOTE — Progress Notes (Signed)
Pt called and stated she had her preop COVID test done today so she will be arriving at 0830 on Wednesday for surgery instead of 0730.

## 2021-06-17 LAB — SARS CORONAVIRUS 2 (TAT 6-24 HRS): SARS Coronavirus 2: NEGATIVE

## 2021-06-18 ENCOUNTER — Ambulatory Visit (HOSPITAL_COMMUNITY): Payer: Medicare Other | Admitting: Vascular Surgery

## 2021-06-18 ENCOUNTER — Other Ambulatory Visit: Payer: Self-pay

## 2021-06-18 ENCOUNTER — Ambulatory Visit (HOSPITAL_COMMUNITY): Payer: Medicare Other

## 2021-06-18 ENCOUNTER — Ambulatory Visit (HOSPITAL_COMMUNITY): Payer: Medicare Other | Admitting: Certified Registered Nurse Anesthetist

## 2021-06-18 ENCOUNTER — Encounter (HOSPITAL_COMMUNITY)
Admission: RE | Disposition: A | Discharge status: Home / Self Care | Payer: Self-pay | Source: Home / Self Care | Attending: Neurosurgery

## 2021-06-18 ENCOUNTER — Encounter (HOSPITAL_COMMUNITY): Payer: Self-pay | Admitting: Neurosurgery

## 2021-06-18 ENCOUNTER — Ambulatory Visit (HOSPITAL_COMMUNITY)
Admission: RE | Admit: 2021-06-18 | Discharge: 2021-06-19 | Disposition: A | Discharge status: Home / Self Care | Payer: Medicare Other | Attending: Neurosurgery | Admitting: Neurosurgery

## 2021-06-18 DIAGNOSIS — Z886 Allergy status to analgesic agent status: Secondary | ICD-10-CM | POA: Insufficient documentation

## 2021-06-18 DIAGNOSIS — Z853 Personal history of malignant neoplasm of breast: Secondary | ICD-10-CM | POA: Diagnosis not present

## 2021-06-18 DIAGNOSIS — F17210 Nicotine dependence, cigarettes, uncomplicated: Secondary | ICD-10-CM | POA: Diagnosis not present

## 2021-06-18 DIAGNOSIS — Z885 Allergy status to narcotic agent status: Secondary | ICD-10-CM | POA: Insufficient documentation

## 2021-06-18 DIAGNOSIS — Z880 Allergy status to penicillin: Secondary | ICD-10-CM | POA: Diagnosis not present

## 2021-06-18 DIAGNOSIS — M7138 Other bursal cyst, other site: Secondary | ICD-10-CM | POA: Diagnosis present

## 2021-06-18 DIAGNOSIS — Z801 Family history of malignant neoplasm of trachea, bronchus and lung: Secondary | ICD-10-CM | POA: Insufficient documentation

## 2021-06-18 DIAGNOSIS — Z803 Family history of malignant neoplasm of breast: Secondary | ICD-10-CM | POA: Diagnosis not present

## 2021-06-18 DIAGNOSIS — E785 Hyperlipidemia, unspecified: Secondary | ICD-10-CM | POA: Diagnosis not present

## 2021-06-18 DIAGNOSIS — I10 Essential (primary) hypertension: Secondary | ICD-10-CM | POA: Insufficient documentation

## 2021-06-18 DIAGNOSIS — Z90710 Acquired absence of both cervix and uterus: Secondary | ICD-10-CM | POA: Diagnosis not present

## 2021-06-18 DIAGNOSIS — Z419 Encounter for procedure for purposes other than remedying health state, unspecified: Secondary | ICD-10-CM

## 2021-06-18 DIAGNOSIS — Z888 Allergy status to other drugs, medicaments and biological substances status: Secondary | ICD-10-CM | POA: Diagnosis not present

## 2021-06-18 DIAGNOSIS — Z79899 Other long term (current) drug therapy: Secondary | ICD-10-CM | POA: Diagnosis not present

## 2021-06-18 DIAGNOSIS — Z882 Allergy status to sulfonamides status: Secondary | ICD-10-CM | POA: Insufficient documentation

## 2021-06-18 HISTORY — PX: LUMBAR LAMINECTOMY/DECOMPRESSION MICRODISCECTOMY: SHX5026

## 2021-06-18 SURGERY — LUMBAR LAMINECTOMY/DECOMPRESSION MICRODISCECTOMY 1 LEVEL
Anesthesia: General | Site: Back | Laterality: Right

## 2021-06-18 MED ORDER — LIDOCAINE-EPINEPHRINE 1 %-1:100000 IJ SOLN
INTRAMUSCULAR | Status: DC | PRN
  Administered 2021-06-18: 5 mL

## 2021-06-18 MED ORDER — MENTHOL 3 MG MT LOZG: 1.0000 | LOZENGE | OROMUCOSAL | Status: DC | PRN

## 2021-06-18 MED ORDER — PANTOPRAZOLE SODIUM 20 MG PO TBEC: 20.0000 mg | DELAYED_RELEASE_TABLET | Freq: Every day | ORAL | Status: DC

## 2021-06-18 MED ORDER — BUPIVACAINE HCL (PF) 0.25 % IJ SOLN
INTRAMUSCULAR | Status: AC
  Filled 2021-06-18: qty 30

## 2021-06-18 MED ORDER — MEPERIDINE HCL 25 MG/ML IJ SOLN: 6.2500 mg | INTRAMUSCULAR | Status: DC | PRN

## 2021-06-18 MED ORDER — ALUM & MAG HYDROXIDE-SIMETH 200-200-20 MG/5ML PO SUSP: 30.0000 mL | Freq: Four times a day (QID) | ORAL | Status: DC | PRN

## 2021-06-18 MED ORDER — ONDANSETRON HCL 4 MG/2ML IJ SOLN
INTRAMUSCULAR | Status: AC
  Filled 2021-06-18: qty 2

## 2021-06-18 MED ORDER — PHENOL 1.4 % MT LIQD: 1.0000 | OROMUCOSAL | Status: DC | PRN

## 2021-06-18 MED ORDER — CAPSAICIN-MENTHOL 0.025-1.25 % EX PTCH: MEDICATED_PATCH | Freq: Every day | CUTANEOUS | Status: DC | PRN

## 2021-06-18 MED ORDER — LORATADINE 10 MG PO TABS
10.0000 mg | ORAL_TABLET | Freq: Every day | ORAL | Status: DC
  Administered 2021-06-18 – 2021-06-19 (×2): 10 mg via ORAL
  Filled 2021-06-18 (×2): qty 1

## 2021-06-18 MED ORDER — ONDANSETRON HCL 4 MG/2ML IJ SOLN: 4.0000 mg | Freq: Four times a day (QID) | INTRAMUSCULAR | Status: DC | PRN

## 2021-06-18 MED ORDER — OXYCODONE HCL 5 MG PO TABS
10.0000 mg | ORAL_TABLET | ORAL | Status: DC | PRN
  Administered 2021-06-18 – 2021-06-19 (×2): 10 mg via ORAL
  Filled 2021-06-18 (×4): qty 2

## 2021-06-18 MED ORDER — FISH OIL ULTRA 1400 MG PO CAPS: 1400.0000 mg | ORAL_CAPSULE | Freq: Every day | ORAL | Status: DC

## 2021-06-18 MED ORDER — LACTATED RINGERS IV SOLN
INTRAVENOUS | Status: DC
Start: 2021-06-18 — End: 2021-06-18

## 2021-06-18 MED ORDER — HEMOSTATIC AGENTS (NO CHARGE) OPTIME
TOPICAL | Status: DC | PRN
  Administered 2021-06-18: 1 via TOPICAL

## 2021-06-18 MED ORDER — PROPOFOL 10 MG/ML IV BOLUS
INTRAVENOUS | Status: AC
  Filled 2021-06-18: qty 20

## 2021-06-18 MED ORDER — ONDANSETRON HCL 4 MG PO TABS: 4.0000 mg | ORAL_TABLET | Freq: Four times a day (QID) | ORAL | Status: DC | PRN

## 2021-06-18 MED ORDER — CHLORHEXIDINE GLUCONATE CLOTH 2 % EX PADS: 6.0000 | MEDICATED_PAD | Freq: Once | CUTANEOUS | Status: DC

## 2021-06-18 MED ORDER — VANCOMYCIN HCL IN DEXTROSE 1-5 GM/200ML-% IV SOLN: 1000.0000 mg | INTRAVENOUS | Status: AC

## 2021-06-18 MED ORDER — POLYVINYL ALCOHOL 1.4 % OP SOLN
1.0000 [drp] | Freq: Two times a day (BID) | OPHTHALMIC | Status: DC | PRN
  Filled 2021-06-18: qty 15

## 2021-06-18 MED ORDER — CARBOXYMETHYLCELLULOSE SODIUM 0.5 % OP SOLN: 1.0000 [drp] | Freq: Two times a day (BID) | OPHTHALMIC | Status: DC | PRN

## 2021-06-18 MED ORDER — SUGAMMADEX SODIUM 200 MG/2ML IV SOLN
INTRAVENOUS | Status: DC | PRN
  Administered 2021-06-18: 200 mg via INTRAVENOUS

## 2021-06-18 MED ORDER — OXYCODONE HCL 5 MG/5ML PO SOLN: 5.0000 mg | Freq: Once | ORAL | Status: DC | PRN

## 2021-06-18 MED ORDER — MIDAZOLAM HCL 2 MG/2ML IJ SOLN: 0.5000 mg | Freq: Once | INTRAMUSCULAR | Status: DC | PRN

## 2021-06-18 MED ORDER — ROCURONIUM BROMIDE 10 MG/ML (PF) SYRINGE
PREFILLED_SYRINGE | INTRAVENOUS | Status: DC | PRN
  Administered 2021-06-18: 50 mg via INTRAVENOUS

## 2021-06-18 MED ORDER — FLUTICASONE PROPIONATE 50 MCG/ACT NA SUSP
1.0000 | Freq: Every day | NASAL | Status: DC
  Filled 2021-06-18: qty 16

## 2021-06-18 MED ORDER — PROMETHAZINE HCL 25 MG/ML IJ SOLN: 6.2500 mg | INTRAMUSCULAR | Status: DC | PRN

## 2021-06-18 MED ORDER — CYCLOBENZAPRINE HCL 10 MG PO TABS
ORAL_TABLET | ORAL | Status: AC
  Filled 2021-06-18: qty 1

## 2021-06-18 MED ORDER — SIMVASTATIN 20 MG PO TABS
20.0000 mg | ORAL_TABLET | Freq: Every day | ORAL | Status: DC
  Administered 2021-06-18: 20 mg via ORAL
  Filled 2021-06-18: qty 1

## 2021-06-18 MED ORDER — CHLORHEXIDINE GLUCONATE 0.12 % MT SOLN
OROMUCOSAL | Status: AC
  Administered 2021-06-18: 15 mL via OROMUCOSAL
  Filled 2021-06-18: qty 15

## 2021-06-18 MED ORDER — METHOCARBAMOL 500 MG PO TABS: 250.0000 mg | ORAL_TABLET | ORAL | Status: DC | PRN

## 2021-06-18 MED ORDER — FENTANYL CITRATE (PF) 250 MCG/5ML IJ SOLN
INTRAMUSCULAR | Status: DC | PRN
  Administered 2021-06-18: 50 ug via INTRAVENOUS
  Administered 2021-06-18 (×2): 100 ug via INTRAVENOUS

## 2021-06-18 MED ORDER — LIDOCAINE 2% (20 MG/ML) 5 ML SYRINGE
INTRAMUSCULAR | Status: DC | PRN
  Administered 2021-06-18: 20 mg via INTRAVENOUS

## 2021-06-18 MED ORDER — VITAMIN D 25 MCG (1000 UNIT) PO TABS
1000.0000 [IU] | ORAL_TABLET | Freq: Every day | ORAL | Status: DC
  Administered 2021-06-18 – 2021-06-19 (×2): 1000 [IU] via ORAL
  Filled 2021-06-18 (×2): qty 1

## 2021-06-18 MED ORDER — ALBUTEROL SULFATE HFA 108 (90 BASE) MCG/ACT IN AERS: 1.0000 | INHALATION_SPRAY | Freq: Four times a day (QID) | RESPIRATORY_TRACT | Status: DC | PRN

## 2021-06-18 MED ORDER — LABETALOL HCL 5 MG/ML IV SOLN
INTRAVENOUS | Status: AC
  Administered 2021-06-18: 5 mg
  Filled 2021-06-18: qty 4

## 2021-06-18 MED ORDER — LIDOCAINE 2% (20 MG/ML) 5 ML SYRINGE
INTRAMUSCULAR | Status: AC
  Filled 2021-06-18: qty 5

## 2021-06-18 MED ORDER — DIAZEPAM 5 MG PO TABS
5.0000 mg | ORAL_TABLET | Freq: Three times a day (TID) | ORAL | Status: DC | PRN
  Administered 2021-06-19: 5 mg via ORAL
  Filled 2021-06-18: qty 1

## 2021-06-18 MED ORDER — ACETAMINOPHEN 650 MG RE SUPP: 650.0000 mg | RECTAL | Status: DC | PRN

## 2021-06-18 MED ORDER — SINUS RINSE NA PACK: PACK | Freq: Every day | NASAL | Status: DC | PRN

## 2021-06-18 MED ORDER — SODIUM CHLORIDE 0.9% FLUSH: 3.0000 mL | INTRAVENOUS | Status: DC | PRN

## 2021-06-18 MED ORDER — 0.9 % SODIUM CHLORIDE (POUR BTL) OPTIME
TOPICAL | Status: DC | PRN
  Administered 2021-06-18: 1000 mL

## 2021-06-18 MED ORDER — MENTHOL (TOPICAL ANALGESIC) 4 % EX GEL: Freq: Every day | CUTANEOUS | Status: DC | PRN

## 2021-06-18 MED ORDER — ORAL CARE MOUTH RINSE: 15.0000 mL | Freq: Once | OROMUCOSAL | Status: AC

## 2021-06-18 MED ORDER — DEXAMETHASONE SODIUM PHOSPHATE 10 MG/ML IJ SOLN
10.0000 mg | Freq: Once | INTRAMUSCULAR | Status: AC
  Administered 2021-06-18: 10 mg via INTRAVENOUS

## 2021-06-18 MED ORDER — ROCURONIUM BROMIDE 10 MG/ML (PF) SYRINGE
PREFILLED_SYRINGE | INTRAVENOUS | Status: AC
  Filled 2021-06-18: qty 10

## 2021-06-18 MED ORDER — CYCLOBENZAPRINE HCL 10 MG PO TABS
10.0000 mg | ORAL_TABLET | Freq: Three times a day (TID) | ORAL | Status: DC | PRN
  Administered 2021-06-18 (×2): 10 mg via ORAL
  Filled 2021-06-18: qty 1

## 2021-06-18 MED ORDER — ACETAMINOPHEN 500 MG PO TABS: 1000.0000 mg | ORAL_TABLET | Freq: Once | ORAL | Status: DC

## 2021-06-18 MED ORDER — MENTHOL (TOPICAL ANALGESIC) 4 % EX GEL
Freq: Every day | CUTANEOUS | Status: DC | PRN
Start: 2021-06-18 — End: 2021-06-19

## 2021-06-18 MED ORDER — ALBUTEROL SULFATE (2.5 MG/3ML) 0.083% IN NEBU: 2.5000 mg | INHALATION_SOLUTION | Freq: Four times a day (QID) | RESPIRATORY_TRACT | Status: DC | PRN

## 2021-06-18 MED ORDER — HYDROMORPHONE HCL 1 MG/ML IJ SOLN: 0.5000 mg | INTRAMUSCULAR | Status: DC | PRN

## 2021-06-18 MED ORDER — SODIUM CHLORIDE 0.9% FLUSH: 3.0000 mL | Freq: Two times a day (BID) | INTRAVENOUS | Status: DC

## 2021-06-18 MED ORDER — SODIUM CHLORIDE 0.9 % IV SOLN: 250.0000 mL | INTRAVENOUS | Status: DC

## 2021-06-18 MED ORDER — SALINE SPRAY 0.65 % NA SOLN
1.0000 | Freq: Every day | NASAL | Status: DC | PRN
  Filled 2021-06-18: qty 44

## 2021-06-18 MED ORDER — BUPIVACAINE HCL (PF) 0.25 % IJ SOLN
INTRAMUSCULAR | Status: DC | PRN
  Administered 2021-06-18: 10 mL
  Administered 2021-06-18: 5 mL

## 2021-06-18 MED ORDER — FENTANYL CITRATE (PF) 250 MCG/5ML IJ SOLN
INTRAMUSCULAR | Status: AC
  Filled 2021-06-18: qty 5

## 2021-06-18 MED ORDER — THROMBIN 5000 UNITS EX SOLR
CUTANEOUS | Status: AC
  Filled 2021-06-18: qty 10000

## 2021-06-18 MED ORDER — PHENYLEPHRINE HCL-NACL 20-0.9 MG/250ML-% IV SOLN
INTRAVENOUS | Status: DC | PRN
  Administered 2021-06-18: 25 ug/min via INTRAVENOUS

## 2021-06-18 MED ORDER — BIOTIN 2.5 MG PO TABS: ORAL_TABLET | Freq: Every day | ORAL | Status: DC

## 2021-06-18 MED ORDER — MIRABEGRON ER 50 MG PO TB24
50.0000 mg | ORAL_TABLET | Freq: Every day | ORAL | Status: DC
  Administered 2021-06-19: 50 mg via ORAL
  Filled 2021-06-18: qty 1

## 2021-06-18 MED ORDER — CAPSAICIN 0.025 % EX CREA
1.0000 "application " | TOPICAL_CREAM | Freq: Every day | CUTANEOUS | Status: DC | PRN
  Filled 2021-06-18: qty 60

## 2021-06-18 MED ORDER — HYDROMORPHONE HCL 1 MG/ML IJ SOLN: 0.2500 mg | INTRAMUSCULAR | Status: DC | PRN

## 2021-06-18 MED ORDER — VANCOMYCIN HCL IN DEXTROSE 1-5 GM/200ML-% IV SOLN
INTRAVENOUS | Status: AC
  Administered 2021-06-18: 1000 mg via INTRAVENOUS
  Filled 2021-06-18: qty 200

## 2021-06-18 MED ORDER — PROPOFOL 10 MG/ML IV BOLUS
INTRAVENOUS | Status: DC | PRN
  Administered 2021-06-18: 100 mg via INTRAVENOUS

## 2021-06-18 MED ORDER — CHLORHEXIDINE GLUCONATE 0.12 % MT SOLN: 15.0000 mL | Freq: Once | OROMUCOSAL | Status: AC

## 2021-06-18 MED ORDER — CALCIUM POLYCARBOPHIL 625 MG PO TABS
625.0000 mg | ORAL_TABLET | Freq: Two times a day (BID) | ORAL | Status: DC
Start: 2021-06-18 — End: 2021-06-19
  Administered 2021-06-19: 625 mg via ORAL
  Filled 2021-06-18 (×3): qty 1

## 2021-06-18 MED ORDER — MONTELUKAST SODIUM 10 MG PO TABS
10.0000 mg | ORAL_TABLET | Freq: Every day | ORAL | Status: DC
  Administered 2021-06-18: 10 mg via ORAL
  Filled 2021-06-18: qty 1

## 2021-06-18 MED ORDER — LABETALOL HCL 5 MG/ML IV SOLN
5.0000 mg | INTRAVENOUS | Status: AC | PRN
  Administered 2021-06-18 (×2): 5 mg via INTRAVENOUS

## 2021-06-18 MED ORDER — PANTOPRAZOLE SODIUM 40 MG IV SOLR
40.0000 mg | Freq: Every day | INTRAVENOUS | Status: DC
  Administered 2021-06-18: 40 mg via INTRAVENOUS
  Filled 2021-06-18: qty 40

## 2021-06-18 MED ORDER — LIDOCAINE-EPINEPHRINE 1 %-1:100000 IJ SOLN
INTRAMUSCULAR | Status: AC
  Filled 2021-06-18: qty 1

## 2021-06-18 MED ORDER — CEFAZOLIN SODIUM-DEXTROSE 2-4 GM/100ML-% IV SOLN
2.0000 g | Freq: Three times a day (TID) | INTRAVENOUS | Status: AC
  Administered 2021-06-18 – 2021-06-19 (×2): 2 g via INTRAVENOUS
  Filled 2021-06-18 (×2): qty 100

## 2021-06-18 MED ORDER — ONDANSETRON HCL 4 MG/2ML IJ SOLN
INTRAMUSCULAR | Status: DC | PRN
Start: 2021-06-18 — End: 2021-06-18
  Administered 2021-06-18: 4 mg via INTRAVENOUS

## 2021-06-18 MED ORDER — ASCORBIC ACID 500 MG PO TABS
1000.0000 mg | ORAL_TABLET | Freq: Every day | ORAL | Status: DC
  Administered 2021-06-18 – 2021-06-19 (×2): 1000 mg via ORAL
  Filled 2021-06-18 (×2): qty 2

## 2021-06-18 MED ORDER — ACETAMINOPHEN 500 MG PO TABS: 1000.0000 mg | ORAL_TABLET | Freq: Four times a day (QID) | ORAL | Status: DC | PRN

## 2021-06-18 MED ORDER — THROMBIN 5000 UNITS EX SOLR
CUTANEOUS | Status: DC | PRN
  Administered 2021-06-18 (×2): 5000 [IU] via TOPICAL

## 2021-06-18 MED ORDER — ADULT MULTIVITAMIN W/MINERALS CH
1.0000 | ORAL_TABLET | Freq: Every day | ORAL | Status: DC
  Administered 2021-06-18: 1 via ORAL
  Filled 2021-06-18: qty 1

## 2021-06-18 MED ORDER — OXYCODONE HCL 5 MG PO TABS: 5.0000 mg | ORAL_TABLET | Freq: Once | ORAL | Status: DC | PRN

## 2021-06-18 MED ORDER — ACETAMINOPHEN 325 MG PO TABS
650.0000 mg | ORAL_TABLET | ORAL | Status: DC | PRN
  Administered 2021-06-18 – 2021-06-19 (×3): 650 mg via ORAL
  Filled 2021-06-18 (×3): qty 2

## 2021-06-18 MED ORDER — OMEGA-3-ACID ETHYL ESTERS 1 G PO CAPS
1.0000 g | ORAL_CAPSULE | Freq: Every day | ORAL | Status: DC
  Administered 2021-06-18 – 2021-06-19 (×2): 1 g via ORAL
  Filled 2021-06-18 (×2): qty 1

## 2021-06-18 MED ORDER — DEXAMETHASONE SODIUM PHOSPHATE 10 MG/ML IJ SOLN
INTRAMUSCULAR | Status: AC
  Filled 2021-06-18: qty 1

## 2021-06-18 MED ORDER — LISINOPRIL 20 MG PO TABS
20.0000 mg | ORAL_TABLET | Freq: Every day | ORAL | Status: DC
  Administered 2021-06-19: 20 mg via ORAL
  Filled 2021-06-18: qty 1

## 2021-06-18 SURGICAL SUPPLY — 58 items
ADH SKN CLS APL DERMABOND .7 (GAUZE/BANDAGES/DRESSINGS) ×1
APL SKNCLS STERI-STRIP NONHPOA (GAUZE/BANDAGES/DRESSINGS) ×1
BAG COUNTER SPONGE SURGICOUNT (BAG) ×3 IMPLANT
BAG SPNG CNTER NS LX DISP (BAG) ×2
BAG SURGICOUNT SPONGE COUNTING (BAG) ×2
BAND INSRT 18 STRL LF DISP RB (MISCELLANEOUS) ×2
BAND RUBBER #18 3X1/16 STRL (MISCELLANEOUS) ×6 IMPLANT
BENZOIN TINCTURE PRP APPL 2/3 (GAUZE/BANDAGES/DRESSINGS) ×3 IMPLANT
BLADE CLIPPER SURG (BLADE) IMPLANT
BLADE SURG 11 STRL SS (BLADE) ×3 IMPLANT
BUR CUTTER 7.0 ROUND (BURR) ×3 IMPLANT
BUR MATCHSTICK NEURO 3.0 LAGG (BURR) ×3 IMPLANT
CANISTER SUCT 3000ML PPV (MISCELLANEOUS) ×3 IMPLANT
CARTRIDGE OIL MAESTRO DRILL (MISCELLANEOUS) ×1 IMPLANT
CLOSURE WOUND 1/2 X4 (GAUZE/BANDAGES/DRESSINGS) ×1
DECANTER SPIKE VIAL GLASS SM (MISCELLANEOUS) ×3 IMPLANT
DERMABOND ADVANCED (GAUZE/BANDAGES/DRESSINGS) ×2
DERMABOND ADVANCED .7 DNX12 (GAUZE/BANDAGES/DRESSINGS) ×1 IMPLANT
DIFFUSER DRILL AIR PNEUMATIC (MISCELLANEOUS) ×3 IMPLANT
DRAPE HALF SHEET 40X57 (DRAPES) IMPLANT
DRAPE LAPAROTOMY 100X72X124 (DRAPES) ×3 IMPLANT
DRAPE MICROSCOPE LEICA (MISCELLANEOUS) ×3 IMPLANT
DRAPE SURG 17X23 STRL (DRAPES) ×3 IMPLANT
DRSG OPSITE POSTOP 4X6 (GAUZE/BANDAGES/DRESSINGS) ×3 IMPLANT
DURAPREP 26ML APPLICATOR (WOUND CARE) ×3 IMPLANT
ELECT REM PT RETURN 9FT ADLT (ELECTROSURGICAL) ×3
ELECTRODE REM PT RTRN 9FT ADLT (ELECTROSURGICAL) ×1 IMPLANT
GAUZE 4X4 16PLY ~~LOC~~+RFID DBL (SPONGE) ×2 IMPLANT
GAUZE SPONGE 4X4 12PLY STRL (GAUZE/BANDAGES/DRESSINGS) ×3 IMPLANT
GLOVE EXAM NITRILE XL STR (GLOVE) IMPLANT
GLOVE SURG ENC MOIS LTX SZ7 (GLOVE) ×2 IMPLANT
GLOVE SURG ENC MOIS LTX SZ8 (GLOVE) ×3 IMPLANT
GLOVE SURG POLYISO LF SZ7 (GLOVE) ×4 IMPLANT
GLOVE SURG UNDER LTX SZ8.5 (GLOVE) ×3 IMPLANT
GLOVE SURG UNDER POLY LF SZ7 (GLOVE) ×2 IMPLANT
GOWN STRL REUS W/ TWL LRG LVL3 (GOWN DISPOSABLE) ×1 IMPLANT
GOWN STRL REUS W/ TWL XL LVL3 (GOWN DISPOSABLE) ×2 IMPLANT
GOWN STRL REUS W/TWL 2XL LVL3 (GOWN DISPOSABLE) IMPLANT
GOWN STRL REUS W/TWL LRG LVL3 (GOWN DISPOSABLE) ×6
GOWN STRL REUS W/TWL XL LVL3 (GOWN DISPOSABLE) ×6
KIT BASIN OR (CUSTOM PROCEDURE TRAY) ×3 IMPLANT
KIT TURNOVER KIT B (KITS) ×3 IMPLANT
NDL SPNL 22GX3.5 QUINCKE BK (NEEDLE) ×1 IMPLANT
NEEDLE HYPO 22GX1.5 SAFETY (NEEDLE) ×3 IMPLANT
NEEDLE SPNL 22GX3.5 QUINCKE BK (NEEDLE) ×3 IMPLANT
NS IRRIG 1000ML POUR BTL (IV SOLUTION) ×3 IMPLANT
OIL CARTRIDGE MAESTRO DRILL (MISCELLANEOUS) ×3
PACK LAMINECTOMY NEURO (CUSTOM PROCEDURE TRAY) ×3 IMPLANT
SPONGE SURGIFOAM ABS GEL SZ50 (HEMOSTASIS) ×3 IMPLANT
SPONGE T-LAP 4X18 ~~LOC~~+RFID (SPONGE) ×2 IMPLANT
STRIP CLOSURE SKIN 1/2X4 (GAUZE/BANDAGES/DRESSINGS) ×2 IMPLANT
SUT VIC AB 0 CT1 18XCR BRD8 (SUTURE) ×1 IMPLANT
SUT VIC AB 0 CT1 8-18 (SUTURE) ×3
SUT VIC AB 2-0 CT1 18 (SUTURE) ×3 IMPLANT
SUT VICRYL 4-0 PS2 18IN ABS (SUTURE) ×3 IMPLANT
TOWEL GREEN STERILE (TOWEL DISPOSABLE) ×3 IMPLANT
TOWEL GREEN STERILE FF (TOWEL DISPOSABLE) ×3 IMPLANT
WATER STERILE IRR 1000ML POUR (IV SOLUTION) ×3 IMPLANT

## 2021-06-18 NOTE — Evaluation (Signed)
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  Pt with hx of arthritis, L breast CA, HTN  Clinical Impression  Pt admitted with above diagnosis. At baseline pt is independent.  She lives with spouse who is physically disabled but will have children/grandchildren/friends to assist at d/c.  At evaluation, pt was able to ambulate 65' but needed standing rest breaks and reports legs feel weak , so did not attempt stairs. Pt educated on back precautions and able to recall with increased time but needed frequent cues not to twist with activities.  Will benefit from further acute PT. Pt currently with functional limitations due to the deficits listed below (see PT Problem List). Pt will benefit from skilled PT to increase their independence and safety with mobility to allow discharge to the venue listed below.       Follow Up Recommendations No PT follow up;Supervision/Assistance - 24 hour    Equipment Recommendations  Rolling walker with 5" wheels    Recommendations for Other Services       Precautions / Restrictions Precautions Precautions: Fall;Back Precaution Booklet Issued: Yes (comment) (back handout) Precaution Comments: No brace required Restrictions Weight Bearing Restrictions: No      Mobility  Bed Mobility Overal bed mobility: Needs Assistance Bed Mobility: Rolling;Sidelying to Sit;Sit to Sidelying Rolling: Min guard Sidelying to sit: Min guard     Sit to sidelying: Min guard General bed mobility comments: Cues for log roll with min guard to facilitate log roll    Transfers Overall transfer level: Needs  assistance Equipment used: Rolling walker (2 wheeled) Transfers: Sit to/from Stand Sit to Stand: Min guard         General transfer comment: from bed and toilet  Ambulation/Gait Ambulation/Gait assistance: Min guard Gait Distance (Feet): 80 Feet Assistive device: Rolling walker (2 wheeled) Gait Pattern/deviations: Step-through pattern;Decreased stride length Gait velocity: decreased   General Gait Details: Pt required 2 standing rest breaks reporting that legs feel weak.  Min cues for RW use with turns  Financial trader Rankin (Stroke Patients Only)       Balance Overall balance assessment: Needs assistance Sitting-balance support: No upper extremity supported Sitting balance-Leahy Scale: Good     Standing balance support: No upper extremity supported Standing balance-Leahy Scale: Fair Standing balance comment: Could take a few steps in bathroom and performed ADLs without UE support but otherwise using RW                             Pertinent Vitals/Pain Pain Assessment: 0-10 Pain Score: 5  Pain Location: back (no radiating pain like before sx) Pain Descriptors / Indicators: Discomfort Pain Intervention(s): Limited activity within patient's tolerance;Monitored during session    Home Living Family/patient expects to be discharged to:: Private residence Living Arrangements: Spouse/significant other Available Help at Discharge: Family;Friend(s);Available 24 hours/day (spouse is physically unable to assist but has children and grandchildren that will assist) Type of Home: House Home Access: Stairs to enter Entrance Stairs-Rails: Right;Left (cannot reach both) Entrance Stairs-Number of Steps: 4  Home Layout: One level Home Equipment: Cane - single point;Bedside commode;Shower seat;Grab bars - tub/shower      Prior Function Level of Independence: Independent         Comments: Independent with ADLs, IADLs,  driving, and community ambulation     Hand Dominance        Extremity/Trunk Assessment   Upper Extremity Assessment Upper Extremity Assessment: Overall WFL for tasks assessed    Lower Extremity Assessment Lower Extremity Assessment: Overall WFL for tasks assessed (At least 3/5 but did not MMT due to back sx)    Cervical / Trunk Assessment Cervical / Trunk Assessment: Normal  Communication   Communication: No difficulties  Cognition Arousal/Alertness: Awake/alert Behavior During Therapy: WFL for tasks assessed/performed Overall Cognitive Status: Within Functional Limits for tasks assessed                                        General Comments General comments (skin integrity, edema, etc.): Spent increased time educating and providing teach back of back precautions and how to perform ADLS/IADLs safely.  Recommended 24 hr support for at least first 1-2 weeks.  Family present.    Exercises     Assessment/Plan    PT Assessment Patient needs continued PT services  PT Problem List Decreased strength;Decreased mobility;Decreased range of motion;Decreased balance;Decreased knowledge of use of DME;Decreased knowledge of precautions;Decreased activity tolerance       PT Treatment Interventions DME instruction;Therapeutic activities;Modalities;Gait training;Therapeutic exercise;Patient/family education;Stair training;Balance training;Functional mobility training    PT Goals (Current goals can be found in the Care Plan section)  Acute Rehab PT Goals Patient Stated Goal: return home tomorrow PT Goal Formulation: With patient/family Time For Goal Achievement: 07/02/21 Potential to Achieve Goals: Good    Frequency Min 5X/week   Barriers to discharge        Co-evaluation               AM-PAC PT "6 Clicks" Mobility  Outcome Measure Help needed turning from your back to your side while in a flat bed without using bedrails?: A Little Help needed moving  from lying on your back to sitting on the side of a flat bed without using bedrails?: A Little Help needed moving to and from a bed to a chair (including a wheelchair)?: A Little Help needed standing up from a chair using your arms (e.g., wheelchair or bedside chair)?: A Little Help needed to walk in hospital room?: A Little Help needed climbing 3-5 steps with a railing? : A Little 6 Click Score: 18    End of Session Equipment Utilized During Treatment: Gait belt Activity Tolerance: Patient tolerated treatment well Patient left: in bed;with call bell/phone within reach;with bed alarm set Nurse Communication: Mobility status PT Visit Diagnosis: Other abnormalities of gait and mobility (R26.89)    Time: [id number] PT Time Calculation (min) (ACUTE ONLY): 35 min   Charges:   PT Evaluation $PT Eval Low Complexity: 1 Low PT Treatments $Gait Training: 8-22 mins        Abran Richard, PT Acute Rehab Services Pager [phone] [facility] [place] 06/18/2021, 5:47 PM

## 2021-06-18 NOTE — Anesthesia Procedure Notes (Signed)
Procedure Name: Intubation Date/Time: 06/18/2021 10:49 AM Performed by: Lowella Dell, CRNA Pre-anesthesia Checklist: Patient identified, Emergency Drugs available, Suction available and Patient being monitored Patient Re-evaluated:Patient Re-evaluated prior to induction Oxygen Delivery Method: Circle System Utilized Preoxygenation: Pre-oxygenation with 100% oxygen Induction Type: IV induction Ventilation: Mask ventilation without difficulty and Oral airway inserted - appropriate to patient size Laryngoscope Size: Mac and 3 Grade View: Grade I Tube type: Oral Number of attempts: 1 Airway Equipment and Method: Stylet Placement Confirmation: ETT inserted through vocal cords under direct vision, positive ETCO2 and breath sounds checked- equal and bilateral Secured at: 22 cm Tube secured with: Tape Dental Injury: Teeth and Oropharynx as per pre-operative assessment

## 2021-06-18 NOTE — Op Note (Signed)
Operative diagnosis: Right-sided synovial cyst L4-5  Postoperative diagnosis: Same  Procedure: Right-sided decompressive lumbar laminectomy L4-5 for resection of synovial cyst with microdissection and microscopic foraminotomy the right L5 nerve root and microscopic resection of synovial cyst  Surgeon: Dominica Severin Jay Kempe  Assistant: Nash Shearer  Anesthesia: General  EBL: Minimal  HPI: 80 year old female presents with progressive worsening back and right leg radiculopathy rating down L5 nerve root pattern work-up revealed a large synovial cyst at L4-5 on the right.  Due to patient's progression of clinical syndrome imaging findings and failed conservative treatment I recommended decompressive laminectomy on the right for resection of synovial cyst.  I extensively went over the risks and benefits of the operation with her as well as perioperative course expectations of outcome and alternatives of surgery and she understood and agreed to proceed forward.  Operative procedure: Patient brought into the OR was [place] general esthesia positioned prone the Wilson frame her back was prepped and draped in routine sterile fashion.  Preoperative x-ray localized the appropriate level.  After infiltration of 10 cc lidocaine with epi midline incision was made and Bovie electrocautery was used take down subcutaneous tissue and subperiosteal dissection was carried out on the lamina of L4 and L5 on the right.  Intraoperative x-ray confirmed defecation appropriate level so then the inferior two thirds of the lamina for medial facet complex and superior one third of L5 was drilled out with a high-speed drill laminotomy was begun with a 3 mm Kerrison punch ligament there is immediately identified and removed in piecemeal fashion synovial cyst was immediately visualized I extended laminotomy superiorly laterally and inferiorly to get around it and then utilizing 4 Penfield a nerve hook I dissected the cyst off of the thecal sac  for the most part.  There was a membrane that was densely stuck to the disc space under microscopic lamination further under bit the medial facet complex to get around the membrane of the synovial cyst and I tracked it down to the level of the L5 pedicle dissected the L5 nerve off the pedicle and then working superiorly resected as much of the lateral membrane as I could there was a partial component of the medial membrane that I could not remove in the thecal sac for risk of creating a dural tear.  But extended superiorly removed all the superior cyst and ensured adequate decompression both the L4 and L5 nerve roots on the right.  At the end the cystectomy there is no further stenosis on thecal sac or either foramen the wound was copiously irrigated meticulous hemostasis was maintained Gelfoam was ON top of the dura and the muscle fascia approximate layers with active Vicryl skin was closed with a running 4 subcuticular Dermabond benzoin Steri-Strips and a sterile dressing was applied patient recovery in stable condition.  At the end the case all needle counts and sponge counts were correct.

## 2021-06-18 NOTE — H&P (Signed)
Brandi Mcmahon is an 80 y.o. female.   Chief Complaint: Back and right hip and leg pain HPI: 80 year old female with longstanding back and right leg pain gotten acutely worse over the last several weeks to months.  Straight reports the pain radiates to her hip down the side of her leg and the top of the foot.  Work-up has revealed a large synovial cyst on the right at L4-5 causing severe thecal sac compression and right L4 and L5 nerve root compression.  Due to patient's progression of clinical syndrome imaging findings and failed conservative treatment I recommended decompressive laminectomy on the right and resection of right-sided synovial cyst at L4-5.  I have extensively gone over the risks and benefits of the operation with her as well as perioperative course expectations of outcome and alternatives of surgery and she understands and agrees to proceed forward.  Past Medical History:  Diagnosis Date   Allergic rhinitis    Allergy    seasonal and environmental   Anxiety    Arthritis    Cancer ([place]) 2011   Left breast for DCIS   GERD (gastroesophageal reflux disease)    Hyperlipidemia    Hypertension     Past Surgical History:  Procedure Laterality Date   BREAST LUMPECTOMY Left 11/07/2009   for left DCIS   BUNIONECTOMY  2009   CARDIAC CATHETERIZATION     ~ 2004, reported results unremarkable and shoulder symptoms due to bursitis   COLONOSCOPY  2019   COLONOSCOPY WITH PROPOFOL N/A 10/23/2020   Procedure: COLONOSCOPY WITH PROPOFOL;  Surgeon: Irving Copas., MD;  Location: [facility];  Service: Gastroenterology;  Laterality: N/A;   HEMOSTASIS CLIP PLACEMENT  10/23/2020   Procedure: HEMOSTASIS CLIP PLACEMENT;  Surgeon: Irving Copas., MD;  Location: [facility];  Service: Gastroenterology;;   POLYPECTOMY     POLYPECTOMY  10/23/2020   Procedure: POLYPECTOMY;  Surgeon: Irving Copas., MD;  Location: [facility];  Service: Gastroenterology;;   SUBMUCOSAL  LIFTING INJECTION  10/23/2020   Procedure: SUBMUCOSAL LIFTING INJECTION;  Surgeon: Irving Copas., MD;  Location: [facility];  Service: Gastroenterology;;   SUBMUCOSAL TATTOO INJECTION  10/23/2020   Procedure: SUBMUCOSAL TATTOO INJECTION;  Surgeon: Irving Copas., MD;  Location: [facility];  Service: Gastroenterology;;   TOTAL ABDOMINAL HYSTERECTOMY  1987    Family History  Problem Relation Age of Onset   Breast cancer Mother    Lung cancer Father    Colon cancer Neg Hx    Colon polyps Neg Hx    Esophageal cancer Neg Hx    Rectal cancer Neg Hx    Stomach cancer Neg Hx    Social History:  reports that she has been smoking cigarettes. She has been smoking an average of .25 packs per day. She has never used smokeless tobacco. She reports that she does not currently use alcohol. She reports that she does not use drugs.  Allergies:  Allergies  Allergen Reactions   Amoxicillin-Pot Clavulanate Other (See Comments)    ABDONIMAL PAIN    Aspirin Swelling        Clavulanic Acid Hives   Codeine Hives and Other (See Comments)   Alprazolam Other (See Comments)    OVER SEDATES    Bee Venom Hives   Moxifloxacin Hcl In Nacl Rash   Sulfa Antibiotics Rash and Other (See Comments)   Tramadol     Insomnia    Hydrocodone-Acetaminophen Other (See Comments)    Insomnia     Medications  Prior to Admission  Medication Sig Dispense Refill   acetaminophen (TYLENOL) 500 MG tablet Take 1,000 mg by mouth every 6 (six) hours as needed for moderate pain.     albuterol (VENTOLIN HFA) 108 (90 Base) MCG/ACT inhaler Inhale 1-2 puffs into the lungs 4 (four) times daily as needed for shortness of breath or wheezing.     Ascorbic Acid (VITAMIN C) 1000 MG tablet Take 1,000 mg by mouth daily.     BIOTIN PO Take 1 tablet by mouth daily.     Calcium Polycarbophil (FIBERCON PO) Take 1 capsule by mouth 2 (two) times daily.     Capsaicin-Menthol (SALONPAS GEL-PATCH HOT EX) Place 1 patch onto  the skin daily as needed (pain.).     carboxymethylcellulose (REFRESH PLUS) 0.5 % SOLN Place 1 drop into both eyes 2 (two) times daily as needed (dry/irritated eyes.).     cetirizine (ZYRTEC) 10 MG tablet Take 10 mg by mouth at bedtime.     diazepam (VALIUM) 5 MG tablet Take 5 mg by mouth 3 (three) times daily as needed for anxiety.     Hypertonic Nasal Wash (SINUS RINSE NA) Place 1 packet into the nose daily as needed (allergies/sinus issues.).     lisinopril (ZESTRIL) 20 MG tablet Take 20 mg by mouth daily.     Menthol, Topical Analgesic, (BIOFREEZE EX) Apply 1 application topically daily as needed (pain).     methocarbamol (ROBAXIN) 500 MG tablet Take 250 mg by mouth every 2 (two) hours as needed for muscle spasms.     mometasone (NASONEX) 50 MCG/ACT nasal spray Place 2 sprays into the nose daily.     montelukast (SINGULAIR) 10 MG tablet Take 10 mg by mouth at bedtime.     Multiple Vitamin (MULTIVITAMIN WITH MINERALS) TABS tablet Take 1 tablet by mouth at bedtime.     MYRBETRIQ 50 MG TB24 tablet Take 50 mg by mouth daily.     Omega-3 Fatty Acids (FISH OIL ULTRA) 1400 MG CAPS Take 1,400 mg by mouth daily.     pantoprazole (PROTONIX) 20 MG tablet Take 20 mg by mouth daily before breakfast.     simvastatin (ZOCOR) 20 MG tablet Take 20 mg by mouth at bedtime.     VITAMIN D PO Take 1 capsule by mouth daily.      No results found for this or any previous visit (from the past 48 hour(s)). No results found.  Review of Systems  Musculoskeletal:  Positive for back pain.  Neurological:  Positive for numbness.   Blood pressure (!) 167/65, pulse 78, temperature 97.8 F (36.6 C), temperature source Oral, resp. rate 20, height '5\' 2"'$  (1.575 m), weight 66.7 kg, SpO2 100 %. Physical Exam HENT:     Head: Normocephalic.     Right Ear: Tympanic membrane normal.     Nose: Nose normal.     Mouth/Throat:     Mouth: Mucous membranes are moist.  Eyes:     Pupils: Pupils are equal, round, and reactive to  light.  Cardiovascular:     Rate and Rhythm: Normal rate.  Pulmonary:     Effort: Pulmonary effort is normal.  Abdominal:     General: Abdomen is flat.  Musculoskeletal:        General: Normal range of motion.     Cervical back: Normal range of motion.  Skin:    General: Skin is warm.  Neurological:     Mental Status: She is alert.     Comments: This  5-5 iliopsoas, quads, hamstrings, gastroc, tibialis, and EHL.     Assessment/Plan 80 year old presents for right-sided laminectomy decompressive for resection of synovial cyst  Elaina Hoops, MD 06/18/2021, 10:10 AM

## 2021-06-18 NOTE — Progress Notes (Signed)

## 2021-06-18 NOTE — Anesthesia Postprocedure Evaluation (Signed)
Anesthesia Post Note  Patient: Brandi Mcmahon  Procedure(s) Performed: Laminectomy and Foraminotomy - right - Lumbar four-Lumbar five for resection of synovial cyst (Right: Back)     Patient location during evaluation: PACU Anesthesia Type: General Level of consciousness: awake and alert, patient cooperative and oriented Pain management: pain level controlled Vital Signs Assessment: post-procedure vital signs reviewed and stable Respiratory status: spontaneous breathing, nonlabored ventilation and respiratory function stable Cardiovascular status: blood pressure returned to baseline and stable Postop Assessment: no apparent nausea or vomiting Anesthetic complications: no   No notable events documented.  Last Vitals:  Vitals:   06/18/21 1255 06/18/21 1258  BP: (!) 190/103 (!) 206/78  Pulse: 72 73  Resp: 12 14  Temp:    SpO2: 100% 100%    Last Pain:  Vitals:   06/18/21 1240  TempSrc:   PainSc: 0-No pain                 Maleaha Hughett,E. Kharma Sampsel

## 2021-06-18 NOTE — Transfer of Care (Signed)
Immediate Anesthesia Transfer of Care Note  Patient: Brandi Mcmahon  Procedure(s) Performed: Laminectomy and Foraminotomy - right - Lumbar four-Lumbar five for resection of synovial cyst (Right: Back)  Patient Location: PACU  Anesthesia Type:General  Level of Consciousness: awake, alert  and patient cooperative  Airway & Oxygen Therapy: Patient Spontanous Breathing and Patient connected to nasal cannula oxygen  Post-op Assessment: Report given to RN, Post -op Vital signs reviewed and stable and Patient moving all extremities  Post vital signs: Reviewed and stable  Last Vitals:  Vitals Value Taken Time  BP 157/57 06/18/21 1241  Temp    Pulse 75 06/18/21 1242  Resp 15 06/18/21 1242  SpO2 100 % 06/18/21 1242  Vitals shown include unvalidated device data.  Last Pain:  Vitals:   06/18/21 0921  TempSrc:   PainSc: 0-No pain         Complications: No notable events documented.

## 2021-06-19 ENCOUNTER — Encounter (HOSPITAL_COMMUNITY): Payer: Self-pay | Admitting: Neurosurgery

## 2021-06-19 DIAGNOSIS — M7138 Other bursal cyst, other site: Secondary | ICD-10-CM | POA: Diagnosis not present

## 2021-06-19 MED ORDER — OXYCODONE-ACETAMINOPHEN 5-325 MG PO TABS: 1.0000 | ORAL_TABLET | ORAL | 0 refills | Status: DC | PRN

## 2021-06-19 MED ORDER — METHOCARBAMOL 500 MG PO TABS: 500.0000 mg | ORAL_TABLET | Freq: Four times a day (QID) | ORAL | 0 refills | Status: DC

## 2021-06-19 NOTE — TOC Transition Note (Signed)
Transition of Care [facility]) - CM/SW Discharge Note   Patient Details  Name: BRANDICE HUGIE MRN: [id number] Date of Birth: 08/11/1941  [facility]) CM/SW Contact:  Joanne Chars, LCSW Phone Number: 06/19/2021, 10:41 AM   Clinical Narrative:   Pt DC with [facility].  See previous note.        Barriers to Discharge: No Barriers Identified   Patient Goals and CMS Choice Patient states their goals for this hospitalization and ongoing recovery are:: "back to normal" CMS Medicare.gov Compare Post Acute Care list provided to::  (see note)    Discharge Placement                       Discharge Plan and Services     Post Acute Care Choice: Home Health          DME Arranged: N/A (DME through [facility] staff)         [place] Arranged: PT [place]: [place] Date [place]: 06/19/21 Time [place]: 1025 Representative spoke with at [place]: [place] ([place]) Interventions     Readmission Risk Interventions No flowsheet data found.

## 2021-06-19 NOTE — TOC Initial Note (Signed)
Transition of Care [facility]) - Initial/Assessment Note    Patient Details  Name: Brandi Mcmahon MRN: [id number] Date of Birth: 1941/07/27  [facility]) CM/SW Contact:    Joanne Chars, LCSW Phone Number: 06/19/2021, 10:37 AM  Clinical Narrative: CSW met with pt and daughter regarding recommendation for [facility] services.  Permission given to speak with daughter present.  Pt reports her husband is currently receiving [place] services through [facility] in [place], [phone].  She would like to also receive services through this agency.  CSW called [facility], they are providing DME to pt husband but they are not providing HH.  HH is handled by a different company.  CSW called wife back and updated her.  She then said she did not have any preference and would be willing to work with any [facility] agency who could provide services.  Cory at [place] accepts referral.  Pt informed and agreeable to this plan.                    Expected Discharge Plan: [place] Barriers to Discharge: No Barriers Identified   Patient Goals and CMS Choice Patient states their goals for this hospitalization and ongoing recovery are:: "back to normal" CMS Medicare.gov Compare Post Acute Care list provided to::  (see note)    Expected Discharge Plan and Services Expected Discharge Plan: [place] Choice: [place] arrangements for the past 2 months: Single Family Home Expected Discharge Date: 06/19/21               DME Arranged: N/A (DME through [facility] staff)         [place] Arranged: PT [place]: [place] Date [place]: 06/19/21 Time [place]: 61 Representative spoke with at [place]: Tommi Rumps  Prior Living Arrangements/Services Living arrangements for the past 2 months: [place] Lives with:: Spouse Patient language and need for interpreter reviewed:: Yes Do you feel safe going back to  the place where you live?: Yes      Need for Family Participation in Patient Care: No (Comment) Care giver support system in place?: Yes (comment) Current home services: Other (comment) (none) Criminal Activity/Legal Involvement Pertinent to Current Situation/Hospitalization: No - Comment as needed  Activities of Daily Living      Permission Sought/Granted Permission sought to share information with : Family Supports Permission granted to share information with : Yes, Verbal Permission Granted  Share Information with NAME: daughter           Emotional Assessment Appearance:: Appears stated age Attitude/Demeanor/Rapport: Engaged Affect (typically observed): Appropriate, Pleasant Orientation: : Oriented to Self, Oriented to Place, Oriented to  Time, Oriented to Situation Alcohol / Substance Use: Not Applicable Psych Involvement: No (comment)  Admission diagnosis:  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

## 2021-06-19 NOTE — Progress Notes (Signed)
Physical Therapy Treatment Patient Details Name: Brandi Mcmahon MRN: [id number] DOB: November 28, 1940 Today's Date: 06/19/2021    History of Present Illness Pt is 80 yo female admitted with R sided synovial cyst L4-5 and is s/p decompressive lumbar laminectomy L4-5 for resection of synovial cyst with microdissection and microscopic foraminotomy the right L5 nerve root and microscopic resection of synovial cyst on 06/18/21.  Pt with hx of arthritis, L breast CA, HTN    PT Comments    Pt progressing well with post-op mobility. She was able to demonstrate transfers and ambulation with gross supervision for safety and RW for support. Pt was educated on precautions, positioning recommendations, appropriate activity progression, and car transfer. Will continue to follow.      Follow Up Recommendations  Home health PT;Supervision/Assistance - 24 hour     Equipment Recommendations  Rolling walker with 5" wheels    Recommendations for Other Services       Precautions / Restrictions Precautions Precautions: Fall;Back Precaution Booklet Issued: Yes (comment) Precaution Comments: No brace required; issued by PT and reviewed. Restrictions Weight Bearing Restrictions: No    Mobility  Bed Mobility Overal bed mobility: Modified Independent Bed Mobility: Rolling;Sidelying to Sit Rolling: Modified independent (Device/Increase time) Sidelying to sit: Modified independent (Device/Increase time)       General bed mobility comments: up in recliner    Transfers Overall transfer level: Needs assistance Equipment used: Rolling walker (2 wheeled) Transfers: Sit to/from [facility] Sit to Stand: Modified independent (Device/Increase time) Stand pivot transfers: Modified independent (Device/Increase time)       General transfer comment: Pt demonstrated proper hand placement on seated surface for safety. No assist required.  Ambulation/Gait Ambulation/Gait assistance:  Supervision Gait Distance (Feet): 250 Feet Assistive device: Rolling walker (2 wheeled) Gait Pattern/deviations: Step-through pattern;Decreased stride length Gait velocity: decreased Gait velocity interpretation: 1.31 - 2.62 ft/sec, indicative of limited community ambulator General Gait Details: No assist required. Pt required a few standing rest breaks due to fatigue and mild SOB.   Stairs Stairs: Yes Stairs assistance: Min guard Stair Management: One rail Left;Step to pattern;Forwards Number of Stairs: 4 General stair comments: VC's for sequencing and general safety.   Wheelchair Mobility    Modified Rankin (Stroke Patients Only)       Balance Overall balance assessment: Needs assistance Sitting-balance support: No upper extremity supported Sitting balance-Leahy Scale: Normal     Standing balance support: No upper extremity supported Standing balance-Leahy Scale: Fair Standing balance comment: not using RW in the room                            Cognition Arousal/Alertness: Awake/alert Behavior During Therapy: WFL for tasks assessed/performed Overall Cognitive Status: Within Functional Limits for tasks assessed                                        Exercises      General Comments        Pertinent Vitals/Pain Pain Assessment: Faces Faces Pain Scale: Hurts little more Pain Location: back (no radiating pain like before sx) Pain Descriptors / Indicators: Discomfort;Tender Pain Intervention(s): Monitored during session    Home Living Family/patient expects to be discharged to:: Private residence Living Arrangements: Spouse/significant other Available Help at Discharge: Family;Friend(s);Available 24 hours/day Type of Home: House Home Access: Stairs to enter Entrance Stairs-Rails: Right;Left Home Layout:  One level Home Equipment: Cane - single point;Bedside commode;Shower seat;Grab bars - tub/shower      Prior Function Level of  Independence: Independent      Comments: Per patient:  Independent with ADLs, IADLs, driving, and community ambulation.   PT Goals (current goals can now be found in the care plan section) Acute Rehab PT Goals Patient Stated Goal: return home to recover PT Goal Formulation: With patient/family Time For Goal Achievement: 07/02/21 Potential to Achieve Goals: Good    Frequency    Min 5X/week      PT Plan      Co-evaluation              AM-PAC PT "6 Clicks" Mobility   Outcome Measure  Help needed turning from your back to your side while in a flat bed without using bedrails?: A Little Help needed moving from lying on your back to sitting on the side of a flat bed without using bedrails?: A Little Help needed moving to and from a bed to a chair (including a wheelchair)?: A Little Help needed standing up from a chair using your arms (e.g., wheelchair or bedside chair)?: A Little Help needed to walk in hospital room?: A Little Help needed climbing 3-5 steps with a railing? : A Little 6 Click Score: 18    End of Session Equipment Utilized During Treatment: Gait belt Activity Tolerance: Patient tolerated treatment well Patient left: in bed;with call bell/phone within reach;with bed alarm set Nurse Communication: Mobility status PT Visit Diagnosis: Other abnormalities of gait and mobility (R26.89)     Time: [id number] PT Time Calculation (min) (ACUTE ONLY): 26 min  Charges:  $Gait Training: 23-37 mins                     Rolinda Roan, PT, DPT Acute Rehabilitation Services Pager: [phone] Office: [phone]    Thelma Comp 06/19/2021, 9:29 AM

## 2021-06-19 NOTE — Discharge Summary (Signed)
Physician Discharge Summary  Patient ID: Brandi Mcmahon MRN: [id number] DOB/AGE: 1941/07/15 80 y.o.  Admit date: 06/18/2021 Discharge date: 06/19/2021  Admission Diagnoses:  Right-sided synovial cyst L4-5    Discharge Diagnoses: same   Discharged Condition: good  Hospital Course: The patient was admitted on 06/18/2021 and taken to the operating room where the patient underwent right laminectomy for resection of synovial cyst L4-5. The patient tolerated the procedure well and was taken to the recovery room and then to the floor in stable condition. The hospital course was routine. There were no complications. The wound remained clean dry and intact. Pt had appropriate back soreness. No complaints of leg pain or new N/T/W. The patient remained afebrile with stable vital signs, and tolerated a regular diet. The patient continued to increase activities, and pain was well controlled with oral pain medications.   Consults: None  Significant Diagnostic Studies:  Results for orders placed or performed in visit on 06/16/21  SARS Coronavirus 2 (TAT 6-24 hrs)  Result Value Ref Range   SARS Coronavirus 2 RESULT: NEGATIVE     DG Lumbar Spine 2-3 Views  Result Date: 06/18/2021 CLINICAL DATA:  Intraoperative localization EXAM: LUMBAR SPINE - 2-3 VIEW COMPARISON:  07/22/2005 FINDINGS: Posterior needle is overlying the L4 spinous process, directed toward the L4-5 disc space. IMPRESSION: Intraoperative localization as above. Electronically Signed   By: Rolm Baptise M.D.   On: 06/18/2021 15:13    Antibiotics:  Anti-infectives (From admission, onward)    Start     Dose/Rate Route Frequency Ordered Stop   06/18/21 1700  ceFAZolin (ANCEF) IVPB 2g/100 mL premix        2 g 200 mL/hr over 30 Minutes Intravenous Every 8 hours 06/18/21 1507 06/19/21 0103   06/18/21 0845  vancomycin (VANCOCIN) IVPB 1000 mg/200 mL premix        1,000 mg 200 mL/hr over 60 Minutes Intravenous On call to O.R. 06/18/21 0834  06/18/21 1037       Discharge Exam: Blood pressure (!) 147/53, pulse 90, temperature 97.7 F (36.5 C), temperature source Oral, resp. rate 16, height '5\' 2"'$  (1.575 m), weight 66.7 kg, SpO2 93 %. Neurologic: Grossly normal Ambulating and voiding well, incision cdi   Discharge Medications:   Allergies as of 06/19/2021       Reactions   Amoxicillin-pot Clavulanate Other (See Comments)   ABDONIMAL PAIN   Aspirin Swelling      Clavulanic Acid Hives   Codeine Hives, Other (See Comments)   Alprazolam Other (See Comments)   OVER SEDATES   Bee Venom Hives   Moxifloxacin Hcl In Nacl Rash   Sulfa Antibiotics Rash, Other (See Comments)   Tramadol    Insomnia    Hydrocodone-acetaminophen Other (See Comments)   Insomnia         Medication List     TAKE these medications    acetaminophen 500 MG tablet Commonly known as: TYLENOL Take 1,000 mg by mouth every 6 (six) hours as needed for moderate pain.   albuterol 108 (90 Base) MCG/ACT inhaler Commonly known as: VENTOLIN HFA Inhale 1-2 puffs into the lungs 4 (four) times daily as needed for shortness of breath or wheezing.   BIOFREEZE EX Apply 1 application topically daily as needed (pain).   BIOTIN PO Take 1 tablet by mouth daily.   carboxymethylcellulose 0.5 % Soln Commonly known as: REFRESH PLUS Place 1 drop into both eyes 2 (two) times daily as needed (dry/irritated eyes.).   cetirizine 10 MG  tablet Commonly known as: ZYRTEC Take 10 mg by mouth at bedtime.   diazepam 5 MG tablet Commonly known as: VALIUM Take 5 mg by mouth 3 (three) times daily as needed for anxiety.   FIBERCON PO Take 1 capsule by mouth 2 (two) times daily.   Fish Oil Ultra 1400 MG Caps Take 1,400 mg by mouth daily.   lisinopril 20 MG tablet Commonly known as: ZESTRIL Take 20 mg by mouth daily.   methocarbamol 500 MG tablet Commonly known as: ROBAXIN Take 250 mg by mouth every 2 (two) hours as needed for muscle spasms. What changed:  Another medication with the same name was added. Make sure you understand how and when to take each.   methocarbamol 500 MG tablet Commonly known as: Robaxin Take 1 tablet (500 mg total) by mouth 4 (four) times daily. What changed: You were already taking a medication with the same name, and this prescription was added. Make sure you understand how and when to take each.   mometasone 50 MCG/ACT nasal spray Commonly known as: NASONEX Place 2 sprays into the nose daily.   montelukast 10 MG tablet Commonly known as: SINGULAIR Take 10 mg by mouth at bedtime.   multivitamin with minerals Tabs tablet Take 1 tablet by mouth at bedtime.   Myrbetriq 50 MG Tb24 tablet Generic drug: mirabegron ER Take 50 mg by mouth daily.   oxyCODONE-acetaminophen 5-325 MG tablet Commonly known as: Percocet Take 1 tablet by mouth every 4 (four) hours as needed for severe pain.   pantoprazole 20 MG tablet Commonly known as: PROTONIX Take 20 mg by mouth daily before breakfast.   SALONPAS GEL-PATCH HOT EX Place 1 patch onto the skin daily as needed (pain.).   simvastatin 20 MG tablet Commonly known as: ZOCOR Take 20 mg by mouth at bedtime.   SINUS RINSE NA Place 1 packet into the nose daily as needed (allergies/sinus issues.).   vitamin C 1000 MG tablet Take 1,000 mg by mouth daily.   VITAMIN D PO Take 1 capsule by mouth daily.        Disposition: home   Final Dx: right laminectomy L4-5 for resection of synovial cyst.  Discharge Instructions     Call MD for:  difficulty breathing, headache or visual disturbances   Complete by: As directed    Call MD for:  hives   Complete by: As directed    Call MD for:  persistant dizziness or light-headedness   Complete by: As directed    Call MD for:  persistant nausea and vomiting   Complete by: As directed    Call MD for:  redness, tenderness, or signs of infection (pain, swelling, redness, odor or green/yellow discharge around incision site)    Complete by: As directed    Call MD for:  severe uncontrolled pain   Complete by: As directed    Call MD for:  temperature >100.4   Complete by: As directed    Diet - low sodium heart healthy   Complete by: As directed    Driving Restrictions   Complete by: As directed    No driving for 2 weeks, no riding in the car for 1 week   Increase activity slowly   Complete by: As directed    Lifting restrictions   Complete by: As directed    No lifting more than 8 lbs   Remove dressing in 48 hours   Complete by: As directed  Signed: Ocie Cornfield Damari Suastegui 06/19/2021, 7:52 AM

## 2021-06-19 NOTE — Evaluation (Signed)
Occupational Therapy Evaluation Patient Details Name: Brandi Mcmahon MRN: [id number] DOB: 1941-05-22 Today's Date: 06/19/2021    History of Present Illness Pt is 80 yo female admitted with R sided synovial cyst L4-5 and is s/p decompressive lumbar laminectomy L4-5 for resection of synovial cyst with microdissection and microscopic foraminotomy the right L5 nerve root and microscopic resection of synovial cyst on 06/18/21.  Pt with hx of arthritis, L breast CA, HTN   Clinical Impression   Patient admitted for the above diagnosis and procedure.  Patient is very close to her baseline, she continues to have residual back discomfort post surgery.  Good understanding of precautions, hip kit usage.  All questions answered, and no further OT needs identified.      Follow Up Recommendations  No OT follow up    Equipment Recommendations  Other (comment) (Reacher and long handled sponge)    Recommendations for Other Services       Precautions / Restrictions Precautions Precautions: Fall;Back Precaution Comments: No brace required; issued by PT and reviewed. Restrictions Weight Bearing Restrictions: No      Mobility Bed Mobility               General bed mobility comments: up in recliner Patient Response: Cooperative  Transfers Overall transfer level: Needs assistance Equipment used: Rolling walker (2 wheeled) Transfers: Sit to/from [facility] Sit to Stand: Modified independent (Device/Increase time) Stand pivot transfers: Modified independent (Device/Increase time)            Balance Overall balance assessment: Needs assistance Sitting-balance support: No upper extremity supported Sitting balance-Leahy Scale: Normal     Standing balance support: No upper extremity supported Standing balance-Leahy Scale: Fair Standing balance comment: not using RW in the room                           ADL either performed or assessed with clinical  judgement   ADL Overall ADL's : At baseline                                       General ADL Comments: able to complete ADL with good follow through of back precautions - sit stand level.     Vision Baseline Vision/History: 1 Wears glasses Ability to See in Adequate Light: 0 Adequate Patient Visual Report: No change from baseline       Perception     Praxis      Pertinent Vitals/Pain Pain Assessment: Faces Faces Pain Scale: Hurts little more Pain Descriptors / Indicators: Discomfort;Tender Pain Intervention(s): Monitored during session     Hand Dominance Right   Extremity/Trunk Assessment Upper Extremity Assessment Upper Extremity Assessment: Overall WFL for tasks assessed   Lower Extremity Assessment Lower Extremity Assessment: Defer to PT evaluation   Cervical / Trunk Assessment Cervical / Trunk Assessment: Other exceptions Cervical / Trunk Exceptions: decompressive surgery   Communication Communication Communication: No difficulties   Cognition Arousal/Alertness: Awake/alert Behavior During Therapy: WFL for tasks assessed/performed Overall Cognitive Status: Within Functional Limits for tasks assessed                                                      Home Living Family/patient expects to  be discharged to:: Private residence Living Arrangements: Spouse/significant other Available Help at Discharge: Family;Friend(s);Available 24 hours/day Type of Home: House Home Access: Stairs to enter [employer] of Steps: 4 Entrance Stairs-Rails: Right;Left Home Layout: One level     Bathroom Shower/Tub: Occupational psychologist: Handicapped height Bathroom Accessibility: Yes   Home Equipment: [place] - single point;Bedside commode;Shower seat;Grab bars - tub/shower          Prior Functioning/Environment Level of Independence: Independent        Comments: Per patient:  Independent with ADLs, IADLs,  driving, and community ambulation.        OT Problem List: Pain      OT Treatment/Interventions:      OT Goals(Current goals can be found in the care plan section) Acute Rehab OT Goals Patient Stated Goal: return home to recover OT Goal Formulation: With patient Time For Goal Achievement: 06/19/21 Potential to Achieve Goals: Good  OT Frequency:     Barriers to D/C:  None noted          Co-evaluation              AM-PAC OT "6 Clicks" Daily Activity     Outcome Measure Help from another person eating meals?: None Help from another person taking care of personal grooming?: None Help from another person toileting, which includes using toliet, bedpan, or urinal?: None Help from another person bathing (including washing, rinsing, drying)?: None Help from another person to put on and taking off regular upper body clothing?: None Help from another person to put on and taking off regular lower body clothing?: None 6 Click Score: 24   End of Session    Activity Tolerance: Patient tolerated treatment well Patient left: in chair;with call bell/phone within reach  OT Visit Diagnosis: Pain                Time: 0012-3935 OT Time Calculation (min): 16 min Charges:  OT General Charges $OT Visit: 1 Visit OT Evaluation $OT Eval Moderate Complexity: 1 Mod  06/19/2021  RP, OTR/L  Acute Rehabilitation Services  Office:  [phone]   Metta Clines 06/19/2021, 9:25 AM

## 2021-06-19 NOTE — Plan of Care (Signed)
Pt doing well. Pt given D/C instructions with verbal understanding. Pt's incision is clean and dry with no sign of infection. Pt's IV was removed prior to D/C. Home Health was arranged by [facility] per MD order. Pt received RW and 3-n-1 per MD order. Pt D/C'd home via wheelchair per MD order. Pt is stable @ D/C and has no other needs at this time. Holli Humbles, RN

## 2021-08-22 ENCOUNTER — Other Ambulatory Visit: Payer: Self-pay | Admitting: Neurosurgery

## 2021-09-10 NOTE — Pre-Procedure Instructions (Signed)
Surgical Instructions    Your procedure is scheduled on Monday 09/15/21.   Report to [facility] Main Entrance "A" at 05:30 A.M., then check in with the Admitting office.  Call this number if you have problems the morning of surgery:  [phone]   If you have any questions prior to your surgery date call [phone]: Open Monday-Friday 8am-4pm    Remember:  Do not eat or drink after midnight the night before your surgery    Take these medicines the morning of surgery with A SIP OF WATER  pantoprazole (PROTONIX)   MYRBETRIQ  mometasone (NASONEX)   Take these medicines if needed:    acetaminophen (TYLENOL)   albuterol (VENTOLIN HFA)   carboxymethylcellulose (REFRESH PLUS)  diazepam (VALIUM)   Hypertonic Nasal Wash   As of today, STOP taking any Aspirin (unless otherwise instructed by your surgeon) Aleve, Naproxen, Ibuprofen, Motrin, Advil, Goody's, BC's, all herbal medications, fish oil, and all vitamins.     After your COVID test   You are not required to quarantine however you are required to wear a well-fitting mask when you are out and around people not in your household.  If your mask becomes wet or soiled, replace with a new one.  Wash your hands often with soap and water for 20 seconds or clean your hands with an alcohol-based hand sanitizer that contains at least 60% alcohol.  Do not share personal items.  Notify your provider: if you are in close contact with someone who has COVID  or if you develop a fever of 100.4 or greater, sneezing, cough, sore throat, shortness of breath or body aches.             Do not wear jewelry or makeup Do not wear lotions, powders, perfumes/colognes, or deodorant. Do not shave 48 hours prior to surgery.  Men may shave face and neck. Do not bring valuables to the hospital. DO Not wear nail polish, gel polish, artificial nails, or any other type of covering on natural nails including finger and toenails. If patients have  artificial nails, gel coating, etc. that need to be removed by a nail salon, please have this removed prior to surgery or surgery may need to be canceled/delayed if the surgeon/ anesthesia feels like the patient is unable to be adequately monitored.             [facility] is not responsible for any belongings or valuables.  Do NOT Smoke (Tobacco/Vaping)  24 hours prior to your procedure  If you use a CPAP at night, you may bring your mask for your overnight stay.   Contacts, glasses, hearing aids, dentures or partials may not be worn into surgery, please bring cases for these belongings   For patients admitted to the hospital, discharge time will be determined by your treatment team.   Patients discharged the day of surgery will not be allowed to drive home, and someone needs to stay with them for 24 hours.  NO VISITORS WILL BE ALLOWED IN PRE-OP WHERE PATIENTS ARE PREPPED FOR SURGERY.  ONLY 1 SUPPORT PERSON MAY BE PRESENT IN THE WAITING ROOM WHILE YOU ARE IN SURGERY.  IF YOU ARE TO BE ADMITTED, ONCE YOU ARE IN YOUR ROOM YOU WILL BE ALLOWED TWO (2) VISITORS. 1 (ONE) VISITOR MAY STAY OVERNIGHT BUT MUST ARRIVE TO THE ROOM BY 8pm.  Minor children may have two parents present. Special consideration for safety and communication needs will be reviewed on a case by case basis.  Special instructions:    Oral Hygiene is also important to reduce your risk of infection.  Remember - BRUSH YOUR TEETH THE MORNING OF SURGERY WITH YOUR REGULAR TOOTHPASTE   [place]- Preparing For Surgery  Before surgery, you can play an important role. Because skin is not sterile, your skin needs to be as free of germs as possible. You can reduce the number of germs on your skin by washing with CHG (chlorahexidine gluconate) Soap before surgery.  CHG is an antiseptic cleaner which kills germs and bonds with the skin to continue killing germs even after washing.     Please do not use if you have an allergy to CHG or  antibacterial soaps. If your skin becomes reddened/irritated stop using the CHG.  Do not shave (including legs and underarms) for at least 48 hours prior to first CHG shower. It is OK to shave your face.  Please follow these instructions carefully.     Shower the NIGHT BEFORE SURGERY and the MORNING OF SURGERY with CHG Soap.   If you chose to wash your hair, wash your hair first as usual with your normal shampoo. After you shampoo, rinse your hair and body thoroughly to remove the shampoo.  Then [employer] and genitals (private parts) with your normal soap and rinse thoroughly to remove soap.  After that Use CHG Soap as you would any other liquid soap. You can apply CHG directly to the skin and wash gently with a scrungie or a clean washcloth.   Apply the CHG Soap to your body ONLY FROM THE NECK DOWN.  Do not use on open wounds or open sores. Avoid contact with your eyes, ears, mouth and genitals (private parts). Wash Face and genitals (private parts)  with your normal soap.   Wash thoroughly, paying special attention to the area where your surgery will be performed.  Thoroughly rinse your body with warm water from the neck down.  DO NOT shower/wash with your normal soap after using and rinsing off the CHG Soap.  Pat yourself dry with a CLEAN TOWEL.  Wear CLEAN PAJAMAS to bed the night before surgery  Place CLEAN SHEETS on your bed the night before your surgery  DO NOT SLEEP WITH PETS.   Day of Surgery:  Take a shower with CHG soap. Wear Clean/Comfortable clothing the morning of surgery Do not apply any deodorants/lotions.   Remember to brush your teeth WITH YOUR REGULAR TOOTHPASTE.   Please read over the following fact sheets that you were given.

## 2021-09-11 ENCOUNTER — Encounter (HOSPITAL_COMMUNITY)
Admission: RE | Admit: 2021-09-11 | Discharge: 2021-09-11 | Disposition: A | Discharge status: Home / Self Care | Payer: Medicare Other | Source: Ambulatory Visit | Attending: Neurosurgery | Admitting: Neurosurgery

## 2021-09-11 ENCOUNTER — Other Ambulatory Visit: Payer: Self-pay

## 2021-09-11 ENCOUNTER — Encounter (HOSPITAL_COMMUNITY): Payer: Self-pay

## 2021-09-11 VITALS — BP 140/66 | HR 81 | Temp 97.6°F | Resp 17 | Ht 62.0 in | Wt 140.0 lb

## 2021-09-11 DIAGNOSIS — I10 Essential (primary) hypertension: Secondary | ICD-10-CM | POA: Insufficient documentation

## 2021-09-11 DIAGNOSIS — E785 Hyperlipidemia, unspecified: Secondary | ICD-10-CM | POA: Insufficient documentation

## 2021-09-11 DIAGNOSIS — Z01818 Encounter for other preprocedural examination: Secondary | ICD-10-CM

## 2021-09-11 DIAGNOSIS — F17210 Nicotine dependence, cigarettes, uncomplicated: Secondary | ICD-10-CM | POA: Diagnosis not present

## 2021-09-11 DIAGNOSIS — K219 Gastro-esophageal reflux disease without esophagitis: Secondary | ICD-10-CM | POA: Insufficient documentation

## 2021-09-11 DIAGNOSIS — E871 Hypo-osmolality and hyponatremia: Secondary | ICD-10-CM | POA: Diagnosis not present

## 2021-09-11 DIAGNOSIS — Z01812 Encounter for preprocedural laboratory examination: Secondary | ICD-10-CM | POA: Insufficient documentation

## 2021-09-11 DIAGNOSIS — J309 Allergic rhinitis, unspecified: Secondary | ICD-10-CM | POA: Diagnosis not present

## 2021-09-11 DIAGNOSIS — Z20822 Contact with and (suspected) exposure to covid-19: Secondary | ICD-10-CM | POA: Insufficient documentation

## 2021-09-11 DIAGNOSIS — Z853 Personal history of malignant neoplasm of breast: Secondary | ICD-10-CM | POA: Diagnosis not present

## 2021-09-11 DIAGNOSIS — M48061 Spinal stenosis, lumbar region without neurogenic claudication: Secondary | ICD-10-CM | POA: Insufficient documentation

## 2021-09-11 DIAGNOSIS — M5416 Radiculopathy, lumbar region: Secondary | ICD-10-CM | POA: Insufficient documentation

## 2021-09-11 LAB — BASIC METABOLIC PANEL
Anion gap: 10 (ref 5–15)
BUN: 15 mg/dL (ref 8–23)
CO2: 24 mmol/L (ref 22–32)
Calcium: 9.4 mg/dL (ref 8.9–10.3)
Chloride: 95 mmol/L — ABNORMAL LOW (ref 98–111)
Creatinine, Ser: 1 mg/dL (ref 0.44–1.00)
GFR, Estimated: 57 mL/min — ABNORMAL LOW (ref >60)
Glucose, Bld: 97 mg/dL (ref 70–99)
Potassium: 4.3 mmol/L (ref 3.5–5.1)
Sodium: 129 mmol/L — ABNORMAL LOW (ref 135–145)

## 2021-09-11 LAB — TYPE AND SCREEN
ABO/RH(D): O POS
Antibody Screen: NEGATIVE

## 2021-09-11 LAB — CBC
HCT: 42.9 % (ref 36.0–46.0)
Hemoglobin: 14.4 g/dL (ref 12.0–15.0)
MCH: 32.2 pg (ref 26.0–34.0)
MCHC: 33.6 g/dL (ref 30.0–36.0)
MCV: 96 fL (ref 80.0–100.0)
Platelets: 231 10*3/uL (ref 150–400)
RBC: 4.47 MIL/uL (ref 3.87–5.11)
RDW: 13.2 % (ref 11.5–15.5)
WBC: 8.6 10*3/uL (ref 4.0–10.5)
nRBC: 0 % (ref 0.0–0.2)

## 2021-09-11 LAB — SARS CORONAVIRUS 2 (TAT 6-24 HRS): SARS Coronavirus 2: NEGATIVE

## 2021-09-11 LAB — SURGICAL PCR SCREEN
MRSA, PCR: NEGATIVE
Staphylococcus aureus: NEGATIVE

## 2021-09-11 NOTE — Progress Notes (Signed)
PCP - Dr. Elyn Aquas [place], [place]) Cardiologist - denies  PPM/ICD - denies   Chest x-ray - 11/07/09 EKG - 06/12/21 Stress Test - pt states she had one over 10 years ago, no abnormalities per pt ECHO - denies Cardiac Cath - 2004  Sleep Study - denies   DM- denies  Blood Thinner Instructions: n/a Aspirin Instructions: pt states she hasn't taken ASA in 3 weeks  ERAS Protcol - no, NPO   COVID TEST- 09/11/21 at PAT   Anesthesia review: no  Patient denies shortness of breath, fever, cough and chest pain at PAT appointment   All instructions explained to the patient, with a verbal understanding of the material. Patient agrees to go over the instructions while at home for a better understanding. Patient also instructed to wear a mask in public after being tested for COVID-19. The opportunity to ask questions was provided.

## 2021-09-12 NOTE — Anesthesia Preprocedure Evaluation (Addendum)
Anesthesia Evaluation  Patient identified by MRN, date of birth, ID band Patient awake    Reviewed: Allergy & Precautions, H&P , NPO status , Patient's Chart, lab work & pertinent test results  Airway Mallampati: II  TM Distance: >3 FB Neck ROM: Full    Dental no notable dental hx. (+) Edentulous Upper, Edentulous Lower, Dental Advisory Given   Pulmonary Current Smoker and Patient abstained from smoking.,    Pulmonary exam normal breath sounds clear to auscultation       Cardiovascular Exercise Tolerance: Good hypertension, Pt. on medications  Rhythm:Regular Rate:Normal     Neuro/Psych Anxiety negative neurological ROS     GI/Hepatic Neg liver ROS, GERD  Medicated,  Endo/Other  negative endocrine ROS  Renal/GU negative Renal ROS  negative genitourinary   Musculoskeletal  (+) Arthritis , Osteoarthritis,    Abdominal   Peds  Hematology negative hematology ROS (+)   Anesthesia Other Findings   Reproductive/Obstetrics negative OB ROS                           Anesthesia Physical Anesthesia Plan  ASA: 3  Anesthesia Plan: General   Post-op Pain Management: Tylenol PO (pre-op)   Induction: Intravenous  PONV Risk Score and Plan: 3 and Ondansetron, Dexamethasone and Treatment may vary due to age or medical condition  Airway Management Planned: Oral ETT  Additional Equipment:   Intra-op Plan:   Post-operative Plan: Extubation in OR  Informed Consent: I have reviewed the patients History and Physical, chart, labs and discussed the procedure including the risks, benefits and alternatives for the proposed anesthesia with the patient or authorized representative who has indicated his/her understanding and acceptance.     Dental advisory given  Plan Discussed with: CRNA  Anesthesia Plan Comments: (PAT note written 09/12/2021 by Myra Gianotti, PA-C. Na 129 09/11/21, ISTAT ordered for  day of surgery.   )      Anesthesia Quick Evaluation

## 2021-09-12 NOTE — Progress Notes (Signed)
Anesthesia Chart Review:  Case: [id number] Date/Time: 09/15/21 0715   Procedure: PLIF L45, L5S1 (Back)   Anesthesia type: General   Pre-op diagnosis: LUMBAR RADICULOPATHY   Location: [facility] OR ROOM 21 / [place] OR   Surgeons: Kary Kos, MD       DISCUSSION: Patient is an 80 year old female scheduled for the above procedure. She is s/p lumbar laminectomy 06/18/21.  She was discharged home the following day.  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   I spoke with her prior to 06/18/21 surgery to get more details about history of cardiac cath. "She reported that she was having shoulder pain many years ago (~ 2004 or 2006) and physician was concerned it was anginal equivalent. She said her heart cath results were unremarkable and found that her shoulder symptoms were related to bursitis." She had been physically active until ~ 03/2021 when she began to have worsening, severe BLE pains. She does suffer from allergic rhinitis and reportedly allergic to most trees. She said her nose is "stopped up" most of the time. She takes Zyrtec, Nasonex nasal spray, Singulair, and as needed hypertonic nasal washes. At one point she was followed by an allergist or pulmonologist on allergy injections, but since he left town her allergies are managed by primary care.   Preoperative labs showed sodium 129, previously 134 on 06/12/21. Cr 1.00. CBC normal. Glucose 97. CBC and BMET results routed to Dr. Saintclair Halsted for review. Will order ISTAT for day of surgery to evaluate for any acute worsening for hyponatremia. Marland Kitchen   09/11/2021 presurgical COVID-19 test negative.  Anesthesia team to evaluate on the day of surgery.    VS: BP 140/66   Pulse 81   Temp 36.4 C   Resp 17   Ht 5\' 2"  (1.575 m)   Wt 63.5 kg   SpO2 97%   BMI 25.61 kg/m    PROVIDERS: Elyn Aquas, MD is PCP is [place], [place]    LABS: Preoperative labs noted. See DISCUSSION.  (all labs ordered are listed, but only abnormal results are displayed)  Labs Reviewed  BASIC METABOLIC PANEL - Abnormal; Notable for the following components:      Result Value   Sodium 129 (*)    Chloride 95 (*)    GFR, Estimated 57 (*)    All other components within normal limits  SARS CORONAVIRUS 2 (TAT 6-24 HRS)  SURGICAL PCR SCREEN  CBC  TYPE AND SCREEN    IMAGES: MRI L-spine 07/17/21 (Canopy/PACS): IMPRESSION: 1. Decompressed synovial cyst with significantly improved thecal sac patency at L4-5. 2. No adverse postoperative finding. 3. L5-S1 chronic moderate spinal stenosis with bilateral S1 impingement at the subarticular recesses.   EKG: 06/12/21: Normal sinus rhythm Possible Left atrial enlargement Borderline ECG No significant change since last tracing Confirmed by Quay Burow [phone]) on 06/13/2021 6:48:53 AM    CV: Heart cath ~ 2004-2006. She says it was done as a precaution as she was having shoulder pain. Reportedly results unremarkable and pain later attributed to bursitis.    Past Medical History:  Diagnosis Date   Allergic rhinitis    Allergy    seasonal and environmental   Anxiety    Arthritis    Cancer ([place]) 2011   Left breast for DCIS   GERD (gastroesophageal reflux disease)    Hyperlipidemia    Hypertension  Past Surgical History:  Procedure Laterality Date   BREAST LUMPECTOMY Left 11/07/2009   for left DCIS   BUNIONECTOMY  2009   CARDIAC CATHETERIZATION     ~ 2004, reported results unremarkable and shoulder symptoms due to bursitis   COLONOSCOPY  2019   COLONOSCOPY WITH PROPOFOL N/A 10/23/2020   Procedure: COLONOSCOPY WITH PROPOFOL;  Surgeon: Rush Landmark Telford Nab., MD;  Location: [facility];  Service: Gastroenterology;  Laterality: N/A;   HEMOSTASIS CLIP PLACEMENT  10/23/2020   Procedure: HEMOSTASIS CLIP PLACEMENT;  Surgeon: Irving Copas., MD;  Location: [facility];  Service: Gastroenterology;;   LUMBAR LAMINECTOMY/DECOMPRESSION MICRODISCECTOMY Right 06/18/2021   Procedure: Laminectomy and Foraminotomy - right - Lumbar four-Lumbar five for resection of synovial cyst;  Surgeon: Kary Kos, MD;  Location: [facility];  Service: Neurosurgery;  Laterality: Right;   POLYPECTOMY     POLYPECTOMY  10/23/2020   Procedure: POLYPECTOMY;  Surgeon: Mansouraty, Telford Nab., MD;  Location: [facility];  Service: Gastroenterology;;   Lia [place] LIFTING INJECTION  10/23/2020   Procedure: SUBMUCOSAL LIFTING INJECTION;  Surgeon: Irving Copas., MD;  Location: [facility];  Service: Gastroenterology;;   SUBMUCOSAL TATTOO INJECTION  10/23/2020   Procedure: SUBMUCOSAL TATTOO INJECTION;  Surgeon: Irving Copas., MD;  Location: [facility];  Service: Gastroenterology;;   TOTAL ABDOMINAL HYSTERECTOMY  1987    MEDICATIONS:  acetaminophen (TYLENOL) 500 MG tablet   albuterol (VENTOLIN HFA) 108 (90 Base) MCG/ACT inhaler   Ascorbic Acid (VITAMIN C) 1000 MG tablet   aspirin EC 81 MG tablet   CALCIUM PO   Calcium Polycarbophil (FIBERCON PO)   carboxymethylcellulose (REFRESH PLUS) 0.5 % SOLN   cetirizine (ZYRTEC) 10 MG tablet   diazepam (VALIUM) 5 MG tablet   docusate sodium (COLACE) 100 MG capsule   gabapentin (NEURONTIN) 100 MG capsule   Glucosamine HCl-MSM (GLUCOSAMINE-MSM PO)   Hypertonic Nasal Wash (SINUS RINSE NA)   Ibuprofen-diphenhydrAMINE HCl (ADVIL PM) 200-25 MG CAPS   lisinopril (ZESTRIL) 20 MG tablet   melatonin 3 MG TABS tablet   methocarbamol (ROBAXIN) 500 MG tablet   mometasone (NASONEX) 50 MCG/ACT nasal spray   montelukast (SINGULAIR) 10 MG tablet   Multiple Vitamin (MULTIVITAMIN WITH MINERALS) TABS tablet   MYRBETRIQ 50 MG TB24 tablet   Omega-3 Fatty Acids (FISH OIL ULTRA) 1400 MG CAPS   oxyCODONE-acetaminophen (PERCOCET) 5-325 MG tablet   pantoprazole (PROTONIX) 20 MG tablet   simvastatin (ZOCOR) 20  MG tablet   VITAMIN D PO   No current facility-administered medications for this encounter.    Myra Gianotti, PA-C Surgical Short Stay/Anesthesiology [facility] Phone [phone] [facility] Phone [phone] 09/12/2021 11:01 AM

## 2021-09-15 ENCOUNTER — Encounter (HOSPITAL_COMMUNITY)
Admission: RE | Disposition: A | Discharge status: Home Health | Payer: Self-pay | Source: Home / Self Care | Attending: Neurosurgery

## 2021-09-15 ENCOUNTER — Inpatient Hospital Stay (HOSPITAL_COMMUNITY): Payer: Medicare Other | Admitting: Vascular Surgery

## 2021-09-15 ENCOUNTER — Encounter (HOSPITAL_COMMUNITY): Payer: Self-pay | Admitting: Neurosurgery

## 2021-09-15 ENCOUNTER — Inpatient Hospital Stay (HOSPITAL_COMMUNITY): Payer: Medicare Other

## 2021-09-15 ENCOUNTER — Inpatient Hospital Stay (HOSPITAL_COMMUNITY)
Admission: RE | Admit: 2021-09-15 | Discharge: 2021-09-16 | DRG: 460 | Disposition: A | Discharge status: Home Health | Payer: Medicare Other | Attending: Neurosurgery | Admitting: Neurosurgery

## 2021-09-15 ENCOUNTER — Other Ambulatory Visit: Payer: Self-pay

## 2021-09-15 DIAGNOSIS — M48061 Spinal stenosis, lumbar region without neurogenic claudication: Principal | ICD-10-CM | POA: Diagnosis present

## 2021-09-15 DIAGNOSIS — Z419 Encounter for procedure for purposes other than remedying health state, unspecified: Secondary | ICD-10-CM

## 2021-09-15 DIAGNOSIS — Z7982 Long term (current) use of aspirin: Secondary | ICD-10-CM

## 2021-09-15 DIAGNOSIS — Z801 Family history of malignant neoplasm of trachea, bronchus and lung: Secondary | ICD-10-CM

## 2021-09-15 DIAGNOSIS — Z803 Family history of malignant neoplasm of breast: Secondary | ICD-10-CM

## 2021-09-15 DIAGNOSIS — M7138 Other bursal cyst, other site: Secondary | ICD-10-CM

## 2021-09-15 DIAGNOSIS — J309 Allergic rhinitis, unspecified: Secondary | ICD-10-CM | POA: Diagnosis present

## 2021-09-15 DIAGNOSIS — F17210 Nicotine dependence, cigarettes, uncomplicated: Secondary | ICD-10-CM | POA: Diagnosis not present

## 2021-09-15 DIAGNOSIS — M4316 Spondylolisthesis, lumbar region: Secondary | ICD-10-CM | POA: Diagnosis not present

## 2021-09-15 DIAGNOSIS — M47816 Spondylosis without myelopathy or radiculopathy, lumbar region: Secondary | ICD-10-CM | POA: Diagnosis not present

## 2021-09-15 DIAGNOSIS — K219 Gastro-esophageal reflux disease without esophagitis: Secondary | ICD-10-CM | POA: Diagnosis not present

## 2021-09-15 DIAGNOSIS — Z79899 Other long term (current) drug therapy: Secondary | ICD-10-CM

## 2021-09-15 DIAGNOSIS — M4807 Spinal stenosis, lumbosacral region: Secondary | ICD-10-CM | POA: Diagnosis present

## 2021-09-15 DIAGNOSIS — M47817 Spondylosis without myelopathy or radiculopathy, lumbosacral region: Secondary | ICD-10-CM | POA: Diagnosis present

## 2021-09-15 DIAGNOSIS — I10 Essential (primary) hypertension: Secondary | ICD-10-CM | POA: Diagnosis present

## 2021-09-15 LAB — POCT I-STAT, CHEM 8
BUN: 23 mg/dL (ref 8–23)
BUN: 20 mg/dL (ref 8–23)
Calcium, Ion: 1.1 mmol/L — ABNORMAL LOW (ref 1.15–1.40)
Calcium, Ion: 1.16 mmol/L (ref 1.15–1.40)
Chloride: 102 mmol/L (ref 98–111)
Chloride: 101 mmol/L (ref 98–111)
Creatinine, Ser: 0.9 mg/dL (ref 0.44–1.00)
Creatinine, Ser: 0.9 mg/dL (ref 0.44–1.00)
Glucose, Bld: 97 mg/dL (ref 70–99)
Glucose, Bld: 103 mg/dL — ABNORMAL HIGH (ref 70–99)
HCT: 42 % (ref 36.0–46.0)
HCT: 42 % (ref 36.0–46.0)
Hemoglobin: 14.3 g/dL (ref 12.0–15.0)
Hemoglobin: 14.3 g/dL (ref 12.0–15.0)
Potassium: 5.5 mmol/L — ABNORMAL HIGH (ref 3.5–5.1)
Potassium: 4.2 mmol/L (ref 3.5–5.1)
Sodium: 134 mmol/L — ABNORMAL LOW (ref 135–145)
Sodium: 135 mmol/L (ref 135–145)
TCO2: 27 mmol/L (ref 22–32)
TCO2: 26 mmol/L (ref 22–32)

## 2021-09-15 LAB — ABO/RH: ABO/RH(D): O POS

## 2021-09-15 SURGERY — POSTERIOR LUMBAR FUSION 2 LEVEL
Anesthesia: General | Site: Back

## 2021-09-15 MED ORDER — MONTELUKAST SODIUM 10 MG PO TABS
10.0000 mg | ORAL_TABLET | Freq: Every day | ORAL | Status: DC
  Administered 2021-09-15: 10 mg via ORAL
  Filled 2021-09-15: qty 1

## 2021-09-15 MED ORDER — THROMBIN 20000 UNITS EX SOLR
CUTANEOUS | Status: AC
  Filled 2021-09-15: qty 20000

## 2021-09-15 MED ORDER — CHLORHEXIDINE GLUCONATE 0.12 % MT SOLN
OROMUCOSAL | Status: AC
  Administered 2021-09-15: 15 mL via OROMUCOSAL
  Filled 2021-09-15: qty 15

## 2021-09-15 MED ORDER — PANTOPRAZOLE SODIUM 40 MG IV SOLR: 40.0000 mg | Freq: Every day | INTRAVENOUS | Status: DC

## 2021-09-15 MED ORDER — ROCURONIUM BROMIDE 10 MG/ML (PF) SYRINGE
PREFILLED_SYRINGE | INTRAVENOUS | Status: DC | PRN
  Administered 2021-09-15: 40 mg via INTRAVENOUS
  Administered 2021-09-15: 60 mg via INTRAVENOUS

## 2021-09-15 MED ORDER — ONDANSETRON HCL 4 MG PO TABS: 4.0000 mg | ORAL_TABLET | Freq: Four times a day (QID) | ORAL | Status: DC | PRN

## 2021-09-15 MED ORDER — THROMBIN 20000 UNITS EX SOLR
CUTANEOUS | Status: DC | PRN
  Administered 2021-09-15: 20 mL via TOPICAL

## 2021-09-15 MED ORDER — ONDANSETRON HCL 4 MG/2ML IJ SOLN: 4.0000 mg | Freq: Four times a day (QID) | INTRAMUSCULAR | Status: DC | PRN

## 2021-09-15 MED ORDER — VITAMIN D 25 MCG (1000 UNIT) PO TABS
1000.0000 [IU] | ORAL_TABLET | Freq: Every day | ORAL | Status: DC
  Administered 2021-09-16: 1000 [IU] via ORAL
  Filled 2021-09-15: qty 1

## 2021-09-15 MED ORDER — LIDOCAINE-EPINEPHRINE 1 %-1:100000 IJ SOLN
INTRAMUSCULAR | Status: AC
  Filled 2021-09-15: qty 1

## 2021-09-15 MED ORDER — SUGAMMADEX SODIUM 200 MG/2ML IV SOLN
INTRAVENOUS | Status: DC | PRN
  Administered 2021-09-15: 200 mg via INTRAVENOUS

## 2021-09-15 MED ORDER — PHENYLEPHRINE 40 MCG/ML (10ML) SYRINGE FOR IV PUSH (FOR BLOOD PRESSURE SUPPORT)
PREFILLED_SYRINGE | INTRAVENOUS | Status: DC | PRN
  Administered 2021-09-15: 120 ug via INTRAVENOUS
  Administered 2021-09-15: 80 ug via INTRAVENOUS
  Administered 2021-09-15: 120 ug via INTRAVENOUS
  Administered 2021-09-15: 80 ug via INTRAVENOUS

## 2021-09-15 MED ORDER — GABAPENTIN 100 MG PO CAPS
100.0000 mg | ORAL_CAPSULE | Freq: Three times a day (TID) | ORAL | Status: DC
  Administered 2021-09-15 – 2021-09-16 (×3): 100 mg via ORAL
  Filled 2021-09-15 (×3): qty 1

## 2021-09-15 MED ORDER — FENTANYL CITRATE (PF) 250 MCG/5ML IJ SOLN
INTRAMUSCULAR | Status: DC | PRN
  Administered 2021-09-15 (×2): 50 ug via INTRAVENOUS
  Administered 2021-09-15 (×3): 25 ug via INTRAVENOUS
  Administered 2021-09-15: 50 ug via INTRAVENOUS

## 2021-09-15 MED ORDER — ACETAMINOPHEN 500 MG PO TABS
ORAL_TABLET | ORAL | Status: AC
  Administered 2021-09-15: 1000 mg via ORAL
  Filled 2021-09-15: qty 2

## 2021-09-15 MED ORDER — BUPIVACAINE LIPOSOME 1.3 % IJ SUSP
INTRAMUSCULAR | Status: DC | PRN
  Administered 2021-09-15: 20 mL

## 2021-09-15 MED ORDER — CALCIUM 500 MG PO TABS: ORAL_TABLET | Freq: Every day | ORAL | Status: DC

## 2021-09-15 MED ORDER — DIAZEPAM 5 MG PO TABS
5.0000 mg | ORAL_TABLET | Freq: Three times a day (TID) | ORAL | Status: DC | PRN
  Administered 2021-09-15 – 2021-09-16 (×3): 5 mg via ORAL
  Filled 2021-09-15 (×3): qty 1

## 2021-09-15 MED ORDER — DOCUSATE SODIUM 100 MG PO CAPS
200.0000 mg | ORAL_CAPSULE | Freq: Every evening | ORAL | Status: DC | PRN
Start: 2021-09-15 — End: 2021-09-16
  Administered 2021-09-15: 200 mg via ORAL
  Filled 2021-09-15: qty 2

## 2021-09-15 MED ORDER — GLUCOSAMINE-MSM 1500-500 MG/30ML PO LIQD: Freq: Every day | ORAL | Status: DC

## 2021-09-15 MED ORDER — SODIUM CHLORIDE 0.9 % IV SOLN
250.0000 mL | INTRAVENOUS | Status: DC
  Administered 2021-09-15: 250 mL via INTRAVENOUS

## 2021-09-15 MED ORDER — VANCOMYCIN HCL IN DEXTROSE 1-5 GM/200ML-% IV SOLN: 1000.0000 mg | INTRAVENOUS | Status: AC

## 2021-09-15 MED ORDER — ASPIRIN EC 81 MG PO TBEC
81.0000 mg | DELAYED_RELEASE_TABLET | Freq: Every day | ORAL | Status: DC
  Administered 2021-09-16: 81 mg via ORAL
  Filled 2021-09-15: qty 1

## 2021-09-15 MED ORDER — LACTATED RINGERS IV SOLN: INTRAVENOUS | Status: DC | PRN

## 2021-09-15 MED ORDER — ACETAMINOPHEN 650 MG RE SUPP: 650.0000 mg | RECTAL | Status: DC | PRN

## 2021-09-15 MED ORDER — FISH OIL ULTRA 1400 MG PO CAPS: 1400.0000 mg | ORAL_CAPSULE | Freq: Every day | ORAL | Status: DC

## 2021-09-15 MED ORDER — POLYVINYL ALCOHOL 1.4 % OP SOLN
1.0000 [drp] | Freq: Two times a day (BID) | OPHTHALMIC | Status: DC | PRN
  Filled 2021-09-15: qty 15

## 2021-09-15 MED ORDER — ACETAMINOPHEN 325 MG PO TABS
650.0000 mg | ORAL_TABLET | ORAL | Status: DC | PRN
  Administered 2021-09-16: 650 mg via ORAL
  Filled 2021-09-15: qty 2

## 2021-09-15 MED ORDER — OMEGA-3-ACID ETHYL ESTERS 1 G PO CAPS: 1.0000 g | ORAL_CAPSULE | Freq: Every day | ORAL | Status: DC

## 2021-09-15 MED ORDER — BUPIVACAINE HCL (PF) 0.25 % IJ SOLN
INTRAMUSCULAR | Status: AC
  Filled 2021-09-15: qty 30

## 2021-09-15 MED ORDER — CHLORHEXIDINE GLUCONATE CLOTH 2 % EX PADS: 6.0000 | MEDICATED_PAD | Freq: Once | CUTANEOUS | Status: DC

## 2021-09-15 MED ORDER — ASCORBIC ACID 500 MG PO TABS
1000.0000 mg | ORAL_TABLET | Freq: Every day | ORAL | Status: DC
  Administered 2021-09-16: 1000 mg via ORAL
  Filled 2021-09-15: qty 2

## 2021-09-15 MED ORDER — PROPOFOL 10 MG/ML IV BOLUS
INTRAVENOUS | Status: AC
  Filled 2021-09-15: qty 20

## 2021-09-15 MED ORDER — CEFAZOLIN SODIUM-DEXTROSE 2-4 GM/100ML-% IV SOLN
2.0000 g | Freq: Three times a day (TID) | INTRAVENOUS | Status: AC
  Administered 2021-09-15 (×2): 2 g via INTRAVENOUS
  Filled 2021-09-15 (×2): qty 100

## 2021-09-15 MED ORDER — ORAL CARE MOUTH RINSE: 15.0000 mL | Freq: Once | OROMUCOSAL | Status: AC

## 2021-09-15 MED ORDER — FENTANYL CITRATE (PF) 250 MCG/5ML IJ SOLN
INTRAMUSCULAR | Status: AC
  Filled 2021-09-15: qty 5

## 2021-09-15 MED ORDER — CALCIUM POLYCARBOPHIL 625 MG PO TABS
625.0000 mg | ORAL_TABLET | Freq: Two times a day (BID) | ORAL | Status: DC
  Administered 2021-09-15 – 2021-09-16 (×2): 625 mg via ORAL
  Filled 2021-09-15 (×3): qty 1

## 2021-09-15 MED ORDER — ALUM & MAG HYDROXIDE-SIMETH 200-200-20 MG/5ML PO SUSP: 30.0000 mL | Freq: Four times a day (QID) | ORAL | Status: DC | PRN

## 2021-09-15 MED ORDER — MIRABEGRON ER 50 MG PO TB24
50.0000 mg | ORAL_TABLET | Freq: Every day | ORAL | Status: DC
  Administered 2021-09-16: 50 mg via ORAL
  Filled 2021-09-15: qty 1

## 2021-09-15 MED ORDER — CEFAZOLIN SODIUM 1 G IJ SOLR
INTRAMUSCULAR | Status: AC
  Filled 2021-09-15: qty 20

## 2021-09-15 MED ORDER — VANCOMYCIN HCL IN DEXTROSE 1-5 GM/200ML-% IV SOLN
INTRAVENOUS | Status: AC
  Administered 2021-09-15: 1000 mg via INTRAVENOUS
  Filled 2021-09-15: qty 200

## 2021-09-15 MED ORDER — HYDROMORPHONE HCL 1 MG/ML IJ SOLN: 0.5000 mg | INTRAMUSCULAR | Status: DC | PRN

## 2021-09-15 MED ORDER — HYDROMORPHONE HCL 1 MG/ML IJ SOLN: 0.2500 mg | INTRAMUSCULAR | Status: DC | PRN

## 2021-09-15 MED ORDER — MELATONIN 3 MG PO TABS
3.0000 mg | ORAL_TABLET | Freq: Every evening | ORAL | Status: DC | PRN
Start: 2021-09-15 — End: 2021-09-16

## 2021-09-15 MED ORDER — CHLORHEXIDINE GLUCONATE 0.12 % MT SOLN: 15.0000 mL | Freq: Once | OROMUCOSAL | Status: AC

## 2021-09-15 MED ORDER — CEFAZOLIN SODIUM-DEXTROSE 2-3 GM-%(50ML) IV SOLR
INTRAVENOUS | Status: DC | PRN
  Administered 2021-09-15: 2 g via INTRAVENOUS

## 2021-09-15 MED ORDER — THROMBIN 5000 UNITS EX SOLR
CUTANEOUS | Status: AC
  Filled 2021-09-15: qty 5000

## 2021-09-15 MED ORDER — SODIUM CHLORIDE 0.9% FLUSH
3.0000 mL | Freq: Two times a day (BID) | INTRAVENOUS | Status: DC
  Administered 2021-09-15 (×2): 3 mL via INTRAVENOUS

## 2021-09-15 MED ORDER — ACETAMINOPHEN 500 MG PO TABS
500.0000 mg | ORAL_TABLET | Freq: Four times a day (QID) | ORAL | Status: DC | PRN
Start: 2021-09-15 — End: 2021-09-15

## 2021-09-15 MED ORDER — ONDANSETRON HCL 4 MG/2ML IJ SOLN
INTRAMUSCULAR | Status: DC | PRN
  Administered 2021-09-15: 4 mg via INTRAVENOUS

## 2021-09-15 MED ORDER — SIMVASTATIN 20 MG PO TABS
20.0000 mg | ORAL_TABLET | Freq: Every day | ORAL | Status: DC
  Administered 2021-09-15: 20 mg via ORAL
  Filled 2021-09-15: qty 1

## 2021-09-15 MED ORDER — TRAMADOL HCL 50 MG PO TABS
50.0000 mg | ORAL_TABLET | Freq: Four times a day (QID) | ORAL | Status: DC | PRN
  Administered 2021-09-15 – 2021-09-16 (×4): 50 mg via ORAL
  Filled 2021-09-15 (×4): qty 1

## 2021-09-15 MED ORDER — CARBOXYMETHYLCELLULOSE SODIUM 0.5 % OP SOLN: 1.0000 [drp] | Freq: Two times a day (BID) | OPHTHALMIC | Status: DC | PRN

## 2021-09-15 MED ORDER — PHENYLEPHRINE HCL-NACL 20-0.9 MG/250ML-% IV SOLN
INTRAVENOUS | Status: DC | PRN
  Administered 2021-09-15: 30 ug/min via INTRAVENOUS

## 2021-09-15 MED ORDER — DEXAMETHASONE SODIUM PHOSPHATE 10 MG/ML IJ SOLN
INTRAMUSCULAR | Status: DC | PRN
Start: 2021-09-15 — End: 2021-09-15
  Administered 2021-09-15: 10 mg via INTRAVENOUS

## 2021-09-15 MED ORDER — BUPIVACAINE LIPOSOME 1.3 % IJ SUSP
INTRAMUSCULAR | Status: AC
  Filled 2021-09-15: qty 20

## 2021-09-15 MED ORDER — CYCLOBENZAPRINE HCL 10 MG PO TABS: 10.0000 mg | ORAL_TABLET | Freq: Three times a day (TID) | ORAL | Status: DC | PRN

## 2021-09-15 MED ORDER — SODIUM CHLORIDE 0.9% FLUSH: 3.0000 mL | INTRAVENOUS | Status: DC | PRN

## 2021-09-15 MED ORDER — LISINOPRIL 20 MG PO TABS
20.0000 mg | ORAL_TABLET | Freq: Every day | ORAL | Status: DC
  Administered 2021-09-16: 20 mg via ORAL
  Filled 2021-09-15: qty 1

## 2021-09-15 MED ORDER — ADULT MULTIVITAMIN W/MINERALS CH
1.0000 | ORAL_TABLET | Freq: Every day | ORAL | Status: DC
  Administered 2021-09-15: 1 via ORAL
  Filled 2021-09-15: qty 1

## 2021-09-15 MED ORDER — LACTATED RINGERS IV SOLN: INTRAVENOUS | Status: DC

## 2021-09-15 MED ORDER — LIDOCAINE-EPINEPHRINE 1 %-1:100000 IJ SOLN
INTRAMUSCULAR | Status: DC | PRN
  Administered 2021-09-15: 9 mL

## 2021-09-15 MED ORDER — 0.9 % SODIUM CHLORIDE (POUR BTL) OPTIME
TOPICAL | Status: DC | PRN
  Administered 2021-09-15 (×2): 1000 mL

## 2021-09-15 MED ORDER — ACETAMINOPHEN 500 MG PO TABS: 1000.0000 mg | ORAL_TABLET | Freq: Once | ORAL | Status: AC

## 2021-09-15 MED ORDER — LIDOCAINE 2% (20 MG/ML) 5 ML SYRINGE
INTRAMUSCULAR | Status: DC | PRN
  Administered 2021-09-15: 60 mg via INTRAVENOUS

## 2021-09-15 MED ORDER — SINUS RINSE NA PACK: PACK | Freq: Every day | NASAL | Status: DC | PRN

## 2021-09-15 MED ORDER — MENTHOL 3 MG MT LOZG: 1.0000 | LOZENGE | OROMUCOSAL | Status: DC | PRN

## 2021-09-15 MED ORDER — CALCIUM CARBONATE 1250 (500 CA) MG PO TABS
1.0000 | ORAL_TABLET | Freq: Every day | ORAL | Status: DC
  Filled 2021-09-15: qty 1

## 2021-09-15 MED ORDER — SODIUM CHLORIDE (PF) 0.9 % IJ SOLN
INTRAMUSCULAR | Status: AC
  Filled 2021-09-15: qty 20

## 2021-09-15 MED ORDER — LORATADINE 10 MG PO TABS
10.0000 mg | ORAL_TABLET | Freq: Every day | ORAL | Status: DC
  Administered 2021-09-16: 10 mg via ORAL
  Filled 2021-09-15: qty 1

## 2021-09-15 MED ORDER — PHENYLEPHRINE HCL (PRESSORS) 10 MG/ML IV SOLN
INTRAVENOUS | Status: AC
  Filled 2021-09-15: qty 2

## 2021-09-15 MED ORDER — PROPOFOL 10 MG/ML IV BOLUS
INTRAVENOUS | Status: DC | PRN
  Administered 2021-09-15: 80 mg via INTRAVENOUS

## 2021-09-15 MED ORDER — PANTOPRAZOLE SODIUM 20 MG PO TBEC
20.0000 mg | DELAYED_RELEASE_TABLET | Freq: Every day | ORAL | Status: DC
  Administered 2021-09-16: 20 mg via ORAL
  Filled 2021-09-15: qty 1

## 2021-09-15 MED ORDER — FLUTICASONE PROPIONATE 50 MCG/ACT NA SUSP
2.0000 | Freq: Every day | NASAL | Status: DC
  Filled 2021-09-15: qty 16

## 2021-09-15 MED ORDER — PHENOL 1.4 % MT LIQD: 1.0000 | OROMUCOSAL | Status: DC | PRN

## 2021-09-15 MED ORDER — IBUPROFEN-DIPHENHYDRAMINE HCL 200-25 MG PO CAPS: 2.0000 | ORAL_CAPSULE | Freq: Every evening | ORAL | Status: DC | PRN

## 2021-09-15 MED ORDER — ALBUTEROL SULFATE HFA 108 (90 BASE) MCG/ACT IN AERS
1.0000 | INHALATION_SPRAY | Freq: Four times a day (QID) | RESPIRATORY_TRACT | Status: DC | PRN
Start: 2021-09-15 — End: 2021-09-16

## 2021-09-15 SURGICAL SUPPLY — 83 items
ADH SKN CLS APL DERMABOND .7 (GAUZE/BANDAGES/DRESSINGS) ×1
APL SKNCLS STERI-STRIP NONHPOA (GAUZE/BANDAGES/DRESSINGS) ×1
BAG COUNTER SPONGE SURGICOUNT (BAG) ×2 IMPLANT
BAG SPNG CNTER NS LX DISP (BAG) ×1
BAG SURGICOUNT SPONGE COUNTING (BAG) ×1
BASKET BONE COLLECTION (BASKET) ×3 IMPLANT
BENZOIN TINCTURE PRP APPL 2/3 (GAUZE/BANDAGES/DRESSINGS) ×3 IMPLANT
BLADE CLIPPER SURG (BLADE) IMPLANT
BLADE SURG 11 STRL SS (BLADE) ×3 IMPLANT
BONE VIVIGEN FORMABLE 5.4CC (Bone Implant) ×3 IMPLANT
BUR CUTTER 7.0 ROUND (BURR) ×3 IMPLANT
BUR MATCHSTICK NEURO 3.0 LAGG (BURR) ×3 IMPLANT
CANISTER SUCT 3000ML PPV (MISCELLANEOUS) ×3 IMPLANT
CAP LOCKING THREADED (Cap) ×18 IMPLANT
CARTRIDGE OIL MAESTRO DRILL (MISCELLANEOUS) ×1 IMPLANT
CLOSURE STERI-STRIP 1/2X4 (GAUZE/BANDAGES/DRESSINGS) ×1
CLOSURE WOUND 1/2 X4 (GAUZE/BANDAGES/DRESSINGS) ×2
CLSR STERI-STRIP ANTIMIC 1/2X4 (GAUZE/BANDAGES/DRESSINGS) ×2 IMPLANT
CNTNR URN SCR LID CUP LEK RST (MISCELLANEOUS) ×1 IMPLANT
CONT SPEC 4OZ STRL OR WHT (MISCELLANEOUS) ×3
COVER BACK TABLE 60X90IN (DRAPES) ×3 IMPLANT
DECANTER SPIKE VIAL GLASS SM (MISCELLANEOUS) ×3 IMPLANT
DERMABOND ADVANCED (GAUZE/BANDAGES/DRESSINGS) ×2
DERMABOND ADVANCED .7 DNX12 (GAUZE/BANDAGES/DRESSINGS) ×1 IMPLANT
DIFFUSER DRILL AIR PNEUMATIC (MISCELLANEOUS) ×3 IMPLANT
DRAPE C-ARM 42X72 X-RAY (DRAPES) ×3 IMPLANT
DRAPE C-ARMOR (DRAPES) IMPLANT
DRAPE HALF SHEET 40X57 (DRAPES) ×2 IMPLANT
DRAPE LAPAROTOMY 100X72X124 (DRAPES) ×3 IMPLANT
DRAPE SURG 17X23 STRL (DRAPES) ×1 IMPLANT
DRSG OPSITE 4X5.5 SM (GAUZE/BANDAGES/DRESSINGS) ×3 IMPLANT
DRSG OPSITE POSTOP 4X6 (GAUZE/BANDAGES/DRESSINGS) ×3 IMPLANT
DURAPREP 26ML APPLICATOR (WOUND CARE) ×3 IMPLANT
ELECT REM PT RETURN 9FT ADLT (ELECTROSURGICAL) ×3
ELECTRODE REM PT RTRN 9FT ADLT (ELECTROSURGICAL) ×1 IMPLANT
EVACUATOR 3/16  PVC DRAIN (DRAIN) ×3
EVACUATOR 3/16 PVC DRAIN (DRAIN) ×1 IMPLANT
GAUZE 4X4 16PLY ~~LOC~~+RFID DBL (SPONGE) ×2 IMPLANT
GAUZE SPONGE 4X4 12PLY STRL (GAUZE/BANDAGES/DRESSINGS) IMPLANT
GLOVE EXAM NITRILE XL STR (GLOVE) IMPLANT
GLOVE SURG ENC MOIS LTX SZ7 (GLOVE) ×8 IMPLANT
GLOVE SURG ENC MOIS LTX SZ8 (GLOVE) ×6 IMPLANT
GLOVE SURG UNDER LTX SZ8.5 (GLOVE) ×6 IMPLANT
GLOVE SURG UNDER POLY LF SZ7 (GLOVE) ×10 IMPLANT
GOWN STRL REUS W/ TWL LRG LVL3 (GOWN DISPOSABLE) ×4 IMPLANT
GOWN STRL REUS W/ TWL XL LVL3 (GOWN DISPOSABLE) ×2 IMPLANT
GOWN STRL REUS W/TWL 2XL LVL3 (GOWN DISPOSABLE) IMPLANT
GOWN STRL REUS W/TWL LRG LVL3 (GOWN DISPOSABLE) ×12
GOWN STRL REUS W/TWL XL LVL3 (GOWN DISPOSABLE) ×6
GRAFT BNE MATRIX VG FRMBL MD 5 (Bone Implant) IMPLANT
GRAFT BONE PROTEIOS LRG 5CC (Orthopedic Implant) ×2 IMPLANT
KIT BASIN OR (CUSTOM PROCEDURE TRAY) ×3 IMPLANT
KIT GRAFTMAG DEL NEURO DISP (NEUROSURGERY SUPPLIES) IMPLANT
KIT POSITION SURG JACKSON T1 (MISCELLANEOUS) ×3 IMPLANT
KIT TURNOVER KIT B (KITS) ×3 IMPLANT
MILL MEDIUM DISP (BLADE) ×3 IMPLANT
NDL HYPO 21X1.5 SAFETY (NEEDLE) ×1 IMPLANT
NDL HYPO 25X1 1.5 SAFETY (NEEDLE) ×1 IMPLANT
NEEDLE HYPO 21X1.5 SAFETY (NEEDLE) ×3 IMPLANT
NEEDLE HYPO 25X1 1.5 SAFETY (NEEDLE) ×3 IMPLANT
NS IRRIG 1000ML POUR BTL (IV SOLUTION) ×6 IMPLANT
OIL CARTRIDGE MAESTRO DRILL (MISCELLANEOUS) ×3
PACK LAMINECTOMY NEURO (CUSTOM PROCEDURE TRAY) ×3 IMPLANT
PAD ARMBOARD 7.5X6 YLW CONV (MISCELLANEOUS) ×9 IMPLANT
ROD CREO 60MM (Rod) ×6 IMPLANT
SCREW 6.5X5.5 30MM (Screw) ×3 IMPLANT
SCREW CORTICAL CREO 5.5-4.5X30 (Screw) ×2 IMPLANT
SCREW PA THRD CREO TULIP 5.5X4 (Head) ×12 IMPLANT
SHAFT CREO 30MM (Neuro Prosthesis/Implant) ×8 IMPLANT
SPACER SUSTAIN TI 10X22 13 8D (Spacer) ×4 IMPLANT
SPACER SUSTAIN TI 9X22X12 15D (Spacer) ×6 IMPLANT
SPONGE SURGIFOAM ABS GEL 100 (HEMOSTASIS) ×3 IMPLANT
SPONGE T-LAP 4X18 ~~LOC~~+RFID (SPONGE) ×2 IMPLANT
STRIP CLOSURE SKIN 1/2X4 (GAUZE/BANDAGES/DRESSINGS) ×4 IMPLANT
SUT VIC AB 0 CT1 18XCR BRD8 (SUTURE) ×1 IMPLANT
SUT VIC AB 0 CT1 8-18 (SUTURE) ×3
SUT VIC AB 2-0 CT1 18 (SUTURE) ×3 IMPLANT
SUT VIC AB 4-0 PS2 27 (SUTURE) ×3 IMPLANT
SYR 20ML LL LF (SYRINGE) ×3 IMPLANT
TOWEL GREEN STERILE (TOWEL DISPOSABLE) ×3 IMPLANT
TOWEL GREEN STERILE FF (TOWEL DISPOSABLE) ×3 IMPLANT
TRAY FOLEY MTR SLVR 16FR STAT (SET/KITS/TRAYS/PACK) ×3 IMPLANT
WATER STERILE IRR 1000ML POUR (IV SOLUTION) ×3 IMPLANT

## 2021-09-15 NOTE — Anesthesia Procedure Notes (Addendum)
Procedure Name: Intubation Date/Time: 09/15/2021 7:48 AM Performed by: Harden Mo, CRNA Pre-anesthesia Checklist: Patient identified, Emergency Drugs available, Suction available and Patient being monitored Patient Re-evaluated:Patient Re-evaluated prior to induction Oxygen Delivery Method: Circle System Utilized Preoxygenation: Pre-oxygenation with 100% oxygen Induction Type: IV induction Ventilation: Mask ventilation without difficulty Laryngoscope Size: Miller and 2 Grade View: Grade I Tube type: Oral Tube size: 7.0 mm Number of attempts: 1 Airway Equipment and Method: Stylet and Oral airway Placement Confirmation: ETT inserted through vocal cords under direct vision, positive ETCO2 and breath sounds checked- equal and bilateral Secured at: 21 cm Tube secured with: Tape Dental Injury: Teeth and Oropharynx as per pre-operative assessment

## 2021-09-15 NOTE — Anesthesia Postprocedure Evaluation (Signed)
Anesthesia Post Note  Patient: Brandi Mcmahon  Procedure(s) Performed: Posterior Lumbar Interbody Fusion Lumbar four-five, Lumbar five-Sacral one (Back)     Patient location during evaluation: PACU Anesthesia Type: General Level of consciousness: awake and alert Pain management: pain level controlled Vital Signs Assessment: post-procedure vital signs reviewed and stable Respiratory status: spontaneous breathing, nonlabored ventilation, respiratory function stable and patient connected to nasal cannula oxygen Cardiovascular status: blood pressure returned to baseline and stable Postop Assessment: no apparent nausea or vomiting Anesthetic complications: no   No notable events documented.  Last Vitals:  Vitals:   09/15/21 1220 09/15/21 1304  BP: (!) 150/66 (!) 181/63  Pulse: 76 80  Resp: 13 18  Temp: 36.8 C 36.6 C  SpO2: 95% 98%    Last Pain:  Vitals:   09/15/21 1220  TempSrc:   PainSc: 0-No pain                 [name],W. EDMOND

## 2021-09-15 NOTE — H&P (Signed)
Brandi Mcmahon is an 80 y.o. female.   Chief Complaint: Back and right greater than left leg pain HPI: 80 year old female with severe back and right leg pain rating down L5-S1 nerve root pattern..  Patient previously had an L4-5 right-sided laminotomy which tissue did very well for a while but then has had progressive worsening pain in both hips and legs.  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  Due to patient's progression of clinical syndrome imaging findings and failed conservative treatment I recommended decompression stabilization procedure at L4-5 and L5-S1.  I extensively gone over the risks and benefits of that operation with her as well as perioperative course expectations of outcome and alternatives of surgery and she understands and agrees to proceed forward.  Past Medical History:  Diagnosis Date   Allergic rhinitis    Allergy    seasonal and environmental   Anxiety    Arthritis    Cancer ([place]) 2011   Left breast for DCIS   GERD (gastroesophageal reflux disease)    Hyperlipidemia    Hypertension     Past Surgical History:  Procedure Laterality Date   BREAST LUMPECTOMY Left 11/07/2009   for left DCIS   BUNIONECTOMY  2009   CARDIAC CATHETERIZATION     ~ 2004, reported results unremarkable and shoulder symptoms due to bursitis   COLONOSCOPY  2019   COLONOSCOPY WITH PROPOFOL N/A 10/23/2020   Procedure: COLONOSCOPY WITH PROPOFOL;  Surgeon: Irving Copas., MD;  Location: [facility];  Service: Gastroenterology;  Laterality: N/A;   HEMOSTASIS CLIP PLACEMENT  10/23/2020   Procedure: HEMOSTASIS CLIP PLACEMENT;  Surgeon: Irving Copas., MD;  Location: [facility];  Service: Gastroenterology;;   LUMBAR LAMINECTOMY/DECOMPRESSION MICRODISCECTOMY Right 06/18/2021   Procedure: Laminectomy and Foraminotomy - right - Lumbar four-Lumbar five for resection of synovial cyst;  Surgeon: Kary Kos, MD;   Location: [facility];  Service: Neurosurgery;  Laterality: Right;   POLYPECTOMY     POLYPECTOMY  10/23/2020   Procedure: POLYPECTOMY;  Surgeon: Mansouraty, Telford Nab., MD;  Location: [facility];  Service: Gastroenterology;;   SUBMUCOSAL LIFTING INJECTION  10/23/2020   Procedure: SUBMUCOSAL LIFTING INJECTION;  Surgeon: Irving Copas., MD;  Location: [facility];  Service: Gastroenterology;;   SUBMUCOSAL TATTOO INJECTION  10/23/2020   Procedure: SUBMUCOSAL TATTOO INJECTION;  Surgeon: Irving Copas., MD;  Location: [facility];  Service: Gastroenterology;;   TOTAL ABDOMINAL HYSTERECTOMY  1987    Family History  Problem Relation Age of Onset   Breast cancer Mother    Lung cancer Father    Colon cancer Neg Hx    Colon polyps Neg Hx    Esophageal cancer Neg Hx    Rectal cancer Neg Hx    Stomach cancer Neg Hx    Social History:  reports that she has been smoking cigarettes. She has been smoking an average of .25 packs per day. She has never used smokeless tobacco. She reports that she does not currently use alcohol. She reports that she does not use drugs.  Allergies:  Allergies  Allergen Reactions   Amoxicillin-Pot Clavulanate Other (See Comments)    ABDONIMAL PAIN    Aspirin Swelling        Clavulanic Acid Hives   Codeine Hives and Other (See Comments)   Alprazolam Other (See Comments)    OVER SEDATES    Bee Venom Hives   Moxifloxacin Hcl In Nacl Rash   Sulfa Antibiotics Rash and Other (  See Comments)   Tramadol     Insomnia    Hydrocodone-Acetaminophen Other (See Comments)    Insomnia     Medications Prior to Admission  Medication Sig Dispense Refill   acetaminophen (TYLENOL) 500 MG tablet Take 500-1,000 mg by mouth every 6 (six) hours as needed for moderate pain.     albuterol (VENTOLIN HFA) 108 (90 Base) MCG/ACT inhaler Inhale 1-2 puffs into the lungs 4 (four) times daily as needed for shortness of breath or wheezing.     Ascorbic Acid (VITAMIN C) 1000  MG tablet Take 1,000 mg by mouth daily.     aspirin EC 81 MG tablet Take 81 mg by mouth daily. Swallow whole.     CALCIUM PO Take 1 tablet by mouth daily.     Calcium Polycarbophil (FIBERCON PO) Take 1 capsule by mouth 2 (two) times daily.     carboxymethylcellulose (REFRESH PLUS) 0.5 % SOLN Place 1 drop into both eyes 2 (two) times daily as needed (dry/irritated eyes.).     cetirizine (ZYRTEC) 10 MG tablet Take 10 mg by mouth at bedtime.     diazepam (VALIUM) 5 MG tablet Take 5 mg by mouth 3 (three) times daily as needed for anxiety.     docusate sodium (COLACE) 100 MG capsule Take 200-300 mg by mouth at bedtime as needed for mild constipation.     gabapentin (NEURONTIN) 100 MG capsule Take 100 mg by mouth 3 (three) times daily.     Glucosamine HCl-MSM (GLUCOSAMINE-MSM PO) Take 1 tablet by mouth daily.     Ibuprofen-diphenhydrAMINE HCl (ADVIL PM) 200-25 MG CAPS Take 2 tablets by mouth at bedtime as needed (sleep).     lisinopril (ZESTRIL) 20 MG tablet Take 20 mg by mouth daily.     melatonin 3 MG TABS tablet Take 3-6 mg by mouth at bedtime as needed (sleep).     mometasone (NASONEX) 50 MCG/ACT nasal spray Place 2 sprays into the nose daily.     montelukast (SINGULAIR) 10 MG tablet Take 10 mg by mouth at bedtime.     Multiple Vitamin (MULTIVITAMIN WITH MINERALS) TABS tablet Take 1 tablet by mouth at bedtime.     MYRBETRIQ 50 MG TB24 tablet Take 50 mg by mouth daily.     Omega-3 Fatty Acids (FISH OIL ULTRA) 1400 MG CAPS Take 1,400 mg by mouth daily.     pantoprazole (PROTONIX) 20 MG tablet Take 20 mg by mouth daily before breakfast.     simvastatin (ZOCOR) 20 MG tablet Take 20 mg by mouth at bedtime.     VITAMIN D PO Take 1 capsule by mouth daily.     Hypertonic Nasal Wash (SINUS RINSE NA) Place 1 packet into the nose daily as needed (allergies/sinus issues.).     methocarbamol (ROBAXIN) 500 MG tablet Take 1 tablet (500 mg total) by mouth 4 (four) times daily. (Patient not taking: No sig  reported) 45 tablet 0   oxyCODONE-acetaminophen (PERCOCET) 5-325 MG tablet Take 1 tablet by mouth every 4 (four) hours as needed for severe pain. (Patient not taking: No sig reported) 20 tablet 0    Results for orders placed or performed during the hospital encounter of 09/15/21 (from the past 48 hour(s))  ABO/Rh     Status: None (Preliminary result)   Collection Time: 09/15/21  6:15 AM  Result Value Ref Range   ABO/RH(D) PENDING   I-STAT, chem 8     Status: Abnormal   Collection Time: 09/15/21  6:26 AM  Result Value Ref Range   Sodium 134 (L) 135 - 145 mmol/L   Potassium 5.5 (H) 3.5 - 5.1 mmol/L   Chloride 102 98 - 111 mmol/L   BUN 23 8 - 23 mg/dL   Creatinine, Ser 0.90 0.44 - 1.00 mg/dL   Glucose, Bld 97 70 - 99 mg/dL    Comment: Glucose reference range applies only to samples taken after fasting for at least 8 hours.   Calcium, Ion 1.10 (L) 1.15 - 1.40 mmol/L   TCO2 27 22 - 32 mmol/L   Hemoglobin 14.3 12.0 - 15.0 g/dL   HCT 42.0 36.0 - 46.0 %   No results found.  Review of Systems  Blood pressure (!) 154/75, pulse 75, temperature 98.5 F (36.9 C), temperature source Oral, resp. rate 18, height 5\' 2"  (1.575 m), weight 63.5 kg, SpO2 92 %. Physical Exam   Assessment/Plan 80 year old female presents for L4-5 L5-S1 posterior lumbar interbody fusion  Elaina Hoops, MD 09/15/2021, 7:27 AM

## 2021-09-15 NOTE — Transfer of Care (Signed)
Immediate Anesthesia Transfer of Care Note  Patient: CAMYRA VAETH  Procedure(s) Performed: Posterior Lumbar Interbody Fusion Lumbar four-five, Lumbar five-Sacral one (Back)  Patient Location: PACU  Anesthesia Type:General  Level of Consciousness: awake, alert  and oriented  Airway & Oxygen Therapy: Patient Spontanous Breathing  Post-op Assessment: Report given to RN, Post -op Vital signs reviewed and stable and Patient moving all extremities X 4  Post vital signs: Reviewed and stable  Last Vitals:  Vitals Value Taken Time  BP 150/89 09/15/21 1148  Temp    Pulse 91 09/15/21 1148  Resp 11 09/15/21 1148  SpO2 93 % 09/15/21 1148  Vitals shown include unvalidated device data.  Last Pain:  Vitals:   09/15/21 0633  TempSrc:   PainSc: 0-No pain         Complications: No notable events documented.

## 2021-09-15 NOTE — Progress Notes (Signed)
Orthopedic Tech Progress Note Patient Details:  Brandi Mcmahon 02/14/1941 [id number]  Ortho Devices Type of Ortho Device: Lumbar corsett Ortho Device/Splint Location: BACK Ortho Device/Splint Interventions: Ordered   Post Interventions Patient Tolerated: Well Instructions Provided: Care of device  Janit Pagan 09/15/2021, 1:46 PM

## 2021-09-15 NOTE — Op Note (Signed)
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  Decompressive lumbar laminectomy L5-S1 with complete medial facetectomies radical foraminotomies in excess and requiring more work than would be needed with a standard interbody fusion  3.  Posterior lumbar interbody fusion L4-5 and L5-S1 utilizing the globus insert and rotate titanium cages packed with locally harvested autograft mixed with vivigen and partialis  4.  Cortical screw fixation L4-5 and L5-S1 utilizing the globus Creo amp modular cortical screw set with threaded caps  Surgeon: Dominica Severin Liron Eissler  Assistant: Nash Shearer  Anesthesia: General  EBL: Minimal  HPI: 80 year old female with longstanding back pain previously undergone L4-5 laminotomy on the right had recurrent back and leg pain work-up revealed instability with motion on dynamic films at L4-5 and widening of the facet joints marked facet arthropathy and diastased facet joints at L5-S1.  Due to patient's progression of clinical syndrome imaging findings and failed conservative treatment I recommended decompressive laminectomies and interbody fusions at both those levels.  I extensively went over the risks and benefits of the operation with the patient as well as perioperative course expectations of outcome and alternatives of surgery and she understood and agreed to proceed forward.  Operative procedure: Patient was brought into the OR was [place] general anesthesia positioned prone Wilson frame her back was prepped and draped in routine sterile fashion.  Her old incision was identified extended skin caudally and after infiltration of 10  cc lidocaine with epi midline incision was made Bovie letter cautery was used take down subtenons tissue and subperiosteal dissection was carried lamina of L4-5 and L5-S1.  Intraoperative x-ray did not identify the appropriate level so the spinous processes at L4 and L5 were removed central decompression was begun at L5-S1 marching superiorly into for 5-6 extensive minor scar tissue and overt instability at both L4-5 and L5-S1 was visualized scar tissue was dissected off of the dura and freed up I performed complete medial facetectomies at L4-5 and L5-S1, performed extensive and complete foraminotomies of the L4, L5 and S1 nerve roots below and through the pars at L4-5 as well as L5-S1 aggressive under biting the superior tickling facet single allowed access to the lateral margin of disc base and both levels disc base was then incised cleaned out bilaterally extensive mount of central disc was also removed endplates were scraped with paddle shavers and Epstein curettes and Scovil curettes.  I then sized up to cages 10-13 8 degree at L4-5 9 to 1215 degree at L5-S1 I packed an extensive mount of autograft centrally between the cages and all cages were inserted under fluoroscopy.  Then tension taken a cortical screw placement under fluoroscopy all 6 cortical screws were placed all screws with excellent purchase to the cortical screw at L5 on the right started to breach and cracked the inferomedial aspect the pedicle so I removed it and placed a smaller screw there.  After all sick screws been placed in and the pedicles were inspected to confirm competency I assembled the heads compressed L5 against S1 and L4 against L5 tightened everything down and inspected all the foraminotomy to confirm patency and no migration of graft material lay Gelfoam and top of the dura and closed the wound in layers  with opted Vicryl and a running 4 subcuticular Exparel was injected in the fascia the wound was closed with a subcuticular  Dermabond benzoin Steri-Strips and a sterile dressing was applied patient recovery in stable condition.  At the end the case all needle counts and sponge counts were correct.

## 2021-09-15 NOTE — Progress Notes (Signed)
ISTAT done in short stay; K 5.5. Dr. Therisa Doyne notified.

## 2021-09-16 MED ORDER — TRAMADOL HCL 50 MG PO TABS
50.0000 mg | ORAL_TABLET | Freq: Four times a day (QID) | ORAL | 0 refills | Status: DC | PRN
Start: 2021-09-16 — End: 2022-02-04

## 2021-09-16 NOTE — Discharge Summary (Signed)
Physician Discharge Summary  Patient ID: Brandi Mcmahon MRN: [id number] DOB/AGE: May 10, 1941 80 y.o. Estimated body mass index is 25.61 kg/m as calculated from the following:   Height as of this encounter: 5\' 2"  (1.575 m).   Weight as of this encounter: 63.5 kg.   Admit date: 09/15/2021 Discharge date: 09/16/2021  Admission Diagnoses: Lumbar spinal stenosis L4-5 L5-S1  Discharge Diagnoses: Same Principal Problem:   Spinal stenosis at L4-L5 level   Discharged Condition: good  Hospital Course: Patient is admitted to hospital underwent decompressive laminectomy interbody fusions at L4-5 and L5-S1.  Postoperative patient did very well recovered and floor on the floor was ambulating voiding spontaneously tolerating regular diet and stable for discharge home with scheduled follow-up in 1 to 2 weeks.  Consults: Significant Diagnostic Studies: Treatments: Posterior lumbar interbody fusion L4-5 L5-S1 Discharge Exam: Blood pressure (!) 160/61, pulse 87, temperature 98 F (36.7 C), temperature source Oral, resp. rate 16, height 5\' 2"  (1.575 m), weight 63.5 kg, SpO2 93 %. Strength 5 out of 5 wound clean dry and intact  Disposition: Home  Discharge Instructions     Face-to-face encounter (required for Medicare/Medicaid patients)   Complete by: As directed    I Elaina Hoops certify that this patient is under my care and that I, or a nurse practitioner or physician's assistant working with me, had a face-to-face encounter that meets the physician face-to-face encounter requirements with this patient on 09/16/2021. The encounter with the patient was in whole, or in part for the following medical condition(s) which is the primary reason for home health care (List medical condition): Lumbar spinal stenosis   The encounter with the patient was in whole, or in part, for the following medical condition, which is the primary reason for home health care: Lumbar spinal stenosis   I certify that, based  on my findings, the following services are medically necessary home health services: Physical therapy   Reason for Medically Necessary Home Health Services: Therapy- Personnel officer, Public librarian   My clinical findings support the need for the above services: Pain interferes with ambulation/mobility   Further, I certify that my clinical findings support that this patient is homebound due to: Pain interferes with ambulation/mobility   Home Health   Complete by: As directed    To provide the following care/treatments:  PT OT        Allergies as of 09/16/2021       Reactions   Amoxicillin-pot Clavulanate Other (See Comments)   ABDONIMAL PAIN   Aspirin Swelling      Clavulanic Acid Hives   Codeine Hives, Other (See Comments)   Alprazolam Other (See Comments)   OVER SEDATES   Bee Venom Hives   Moxifloxacin Hcl In Nacl Rash   Sulfa Antibiotics Rash, Other (See Comments)   Tramadol    Insomnia    Hydrocodone-acetaminophen Other (See Comments)   Insomnia         Medication List     TAKE these medications    acetaminophen 500 MG tablet Commonly known as: TYLENOL Take 500-1,000 mg by mouth every 6 (six) hours as needed for moderate pain.   Advil PM 200-25 MG Caps Generic drug: Ibuprofen-diphenhydrAMINE HCl Take 2 tablets by mouth at bedtime as needed (sleep).   albuterol 108 (90 Base) MCG/ACT inhaler Commonly known as: VENTOLIN HFA Inhale 1-2 puffs into the lungs 4 (four) times daily as needed for shortness of breath or wheezing.   aspirin EC 81 MG  tablet Take 81 mg by mouth daily. Swallow whole.   CALCIUM PO Take 1 tablet by mouth daily.   carboxymethylcellulose 0.5 % Soln Commonly known as: REFRESH PLUS Place 1 drop into both eyes 2 (two) times daily as needed (dry/irritated eyes.).   cetirizine 10 MG tablet Commonly known as: ZYRTEC Take 10 mg by mouth at bedtime.   diazepam 5 MG tablet Commonly known as: VALIUM Take 5 mg by mouth 3  (three) times daily as needed for anxiety.   docusate sodium 100 MG capsule Commonly known as: COLACE Take 200-300 mg by mouth at bedtime as needed for mild constipation.   FIBERCON PO Take 1 capsule by mouth 2 (two) times daily.   Fish Oil Ultra 1400 MG Caps Take 1,400 mg by mouth daily.   gabapentin 100 MG capsule Commonly known as: NEURONTIN Take 100 mg by mouth 3 (three) times daily.   GLUCOSAMINE-MSM PO Take 1 tablet by mouth daily.   lisinopril 20 MG tablet Commonly known as: ZESTRIL Take 20 mg by mouth daily.   melatonin 3 MG Tabs tablet Take 3-6 mg by mouth at bedtime as needed (sleep).   methocarbamol 500 MG tablet Commonly known as: Robaxin Take 1 tablet (500 mg total) by mouth 4 (four) times daily.   mometasone 50 MCG/ACT nasal spray Commonly known as: NASONEX Place 2 sprays into the nose daily.   montelukast 10 MG tablet Commonly known as: SINGULAIR Take 10 mg by mouth at bedtime.   multivitamin with minerals Tabs tablet Take 1 tablet by mouth at bedtime.   Myrbetriq 50 MG Tb24 tablet Generic drug: mirabegron ER Take 50 mg by mouth daily.   oxyCODONE-acetaminophen 5-325 MG tablet Commonly known as: Percocet Take 1 tablet by mouth every 4 (four) hours as needed for severe pain.   pantoprazole 20 MG tablet Commonly known as: PROTONIX Take 20 mg by mouth daily before breakfast.   simvastatin 20 MG tablet Commonly known as: ZOCOR Take 20 mg by mouth at bedtime.   SINUS RINSE NA Place 1 packet into the nose daily as needed (allergies/sinus issues.).   traMADol 50 MG tablet Commonly known as: ULTRAM Take 1 tablet (50 mg total) by mouth every 6 (six) hours as needed for moderate pain.   vitamin C 1000 MG tablet Take 1,000 mg by mouth daily.   VITAMIN D PO Take 1 capsule by mouth daily.         Signed: Elaina Hoops 09/16/2021, 8:19 AM

## 2021-09-16 NOTE — TOC Initial Note (Signed)
Transition of Care [facility]) - Initial/Assessment Note    Patient Details  Name: ALVITA FANA MRN: [id number] Date of Birth: [birth year]  [facility]) CM/SW Contact:    Joanne Chars, LCSW Phone Number: 09/16/2021, 9:56 AM  Clinical Narrative:   CSW spoke with pt by phone to discuss DC recommendation for [facility].  Pt agreeable to this, choice discussed verbally with pt.  Pt [place]lives in [place], [place], just discharged earlier this fall with [facility] but does not want to be referred to them again. Pt does not have other agency preference and is willing to work with anyone who can come to her location.  Permission given to speak with son Jenny Reichmann and daughter in Set designer.  PCP in place.    CSW spoke with Esmond Camper at [facility] who accepts this referral.  Pt notified.                Expected Discharge Plan: [place] Barriers to Discharge: No Barriers Identified   Patient Goals and CMS Choice Patient states their goals for this hospitalization and ongoing recovery are:: get back to my normal self, driving CMS Medicare.gov Compare Post Acute Care list provided to::  (Choice was discussed with pt verbally) Choice offered to / list presented to : Patient  Expected Discharge Plan and Services Expected Discharge Plan: [place] In-house Referral: Clinical Social Work   Post Acute Care Choice: [place] arrangements for the past 2 months: [place] Expected Discharge Date: 09/16/21               DME Arranged: N/A (DME through [facility] staff)         HH Arranged: PT, OT [place] Agency: [place] ([place]) Date HH Agency Contacted: 09/16/21 Time [place]: [phone] Representative spoke with at [place]: Esmond Camper  Prior Living Arrangements/Services Living arrangements for the past 2 months: [place] with:: Spouse Patient language and need for interpreter reviewed:: Yes Do you feel safe  going back to the place where you live?: Yes      Need for Family Participation in Patient Care: No (Comment) Care giver support system in place?: Yes (comment) Current home services: Other (comment) (none) Criminal Activity/Legal Involvement Pertinent to Current Situation/Hospitalization: No - Comment as needed  Activities of Daily Living      Permission Sought/Granted Permission sought to share information with : Family Supports Permission granted to share information with : Yes, Verbal Permission Granted  Share Information with NAME: son Jenny Reichmann, daughter in law Sharyn Lull  Permission granted to share info w AGENCY: [facility]        Emotional Assessment Appearance::  (unknown: phone interview) Attitude/Demeanor/Rapport: Engaged Affect (typically observed): Appropriate, Pleasant Orientation: : Oriented to Self, Oriented to Place, Oriented to  Time, Oriented to Situation Alcohol / Substance Use: Not Applicable Psych Involvement: No (comment)  Admission diagnosis:  Spinal stenosis at L4-L5 level [M48.061] Patient Active Problem List   Diagnosis Date Noted   Spinal stenosis at L4-L5 level 09/15/2021   Synovial cyst of lumbar facet joint 06/18/2021   PCP:  [place]:   [id number] [place], [place] [address] Phone: [phone] Fax: [phone]     Social Determinants of Health (SDOH) Interventions    Readmission Risk Interventions No flowsheet data found.

## 2021-09-16 NOTE — Discharge Instructions (Signed)

## 2021-09-16 NOTE — Evaluation (Signed)
Occupational Therapy Evaluation Patient Details Name: Brandi Mcmahon MRN: [id number] DOB: 02-Jan-1941 Today's Date: 09/16/2021   History of Present Illness Brandi Mcmahon is a 80 yo female who is now s/p Posterior lumbar interbody fusion L4-5 L5-S1 on 11/21. PMHx: anciety, arthritis, cancer, GERm hyperlipidemia, hypertension, lumbar lami 05/2021   Clinical Impression   Brandi Mcmahon was mod I PTA with reports of having some "hard days" due to back pain from prior surgery. She lives in a 1 level home, 4 STE and is the primary caregiver of her husband who has dementia. Upon evaluation pt is limited by poor activity tolerance, back precautions, and pain. Overall she required min guard fo functional ambulation with RW, and up to min A for ADLs. She verbalized good understanding of back precautions, however was slightly impulsive throughout the session and required verbal cues. She would benefit from [facility] to progress rehab in the home setting. Recommend d/c to home with [facility].    Recommendations for follow up therapy are one component of a multi-disciplinary discharge planning process, led by the attending physician.  Recommendations may be updated based on patient status, additional functional criteria and insurance authorization.   Follow Up Recommendations  Home health OT    Assistance Recommended at Discharge Intermittent Supervision/Assistance  Functional Status Assessment  Patient has had a recent decline in their functional status and demonstrates the ability to make significant improvements in function in a reasonable and predictable amount of time.  Equipment Recommendations  None recommended by OT       Precautions / Restrictions Precautions Precautions: Fall;Back Precaution Booklet Issued: Yes (comment) Required Braces or Orthoses: Spinal Brace Spinal Brace: Lumbar corset Restrictions Weight Bearing Restrictions: No      Mobility Bed Mobility Overal bed mobility: Needs Assistance Bed  Mobility: Rolling;Sidelying to Sit Rolling: Supervision Sidelying to sit: Supervision       General bed mobility comments: demonstrated great ability to log roll    Transfers Overall transfer level: Needs assistance Equipment used: Rolling walker (2 wheels) Transfers: Sit to/from Stand Sit to Stand: Min guard           General transfer comment: verbal cue for back precautions      Balance Overall balance assessment: Needs assistance Sitting-balance support: Feet supported Sitting balance-Leahy Scale: Fair     Standing balance support: Single extremity supported;During functional activity Standing balance-Leahy Scale: Poor                             ADL either performed or assessed with clinical judgement   ADL Overall ADL's : Needs assistance/impaired Eating/Feeding: Independent;Sitting   Grooming: Wash/dry hands;Supervision/safety;Cueing for compensatory techniques;Standing   Upper Body Bathing: Supervision/ safety;Cueing for compensatory techniques;Sitting   Lower Body Bathing: Min guard;Cueing for compensatory techniques;Adhering to back precautions;Cueing for back precautions   Upper Body Dressing : Supervision/safety;Sitting   Lower Body Dressing: Minimal assistance;Cueing for compensatory techniques;Cueing for back precautions;Sit to/from stand;With adaptive equipment   Toilet Transfer: Min guard;Regular Toilet;Grab bars;Ambulation;Rolling walker (2 wheels)   Toileting- Clothing Manipulation and Hygiene: Min guard;Cueing for compensatory techniques;Cueing for back precautions;Sitting/lateral lean       Functional mobility during ADLs: Min guard;Rolling walker (2 wheels) General ADL Comments: Pt is limited by back precautions, pain, poor activity tolerance and impulsivity. She has AE at home to assist in safe ADLs     Vision Baseline Vision/History: 1 Wears glasses Ability to See in Adequate Light: 0 Adequate Patient Visual  Report: No  change from baseline Vision Assessment?: No apparent visual deficits     Perception     Praxis      Pertinent Vitals/Pain Pain Assessment: Faces Faces Pain Scale: Hurts little more Pain Location: back Pain Descriptors / Indicators: Grimacing;Guarding Pain Intervention(s): Limited activity within patient's tolerance;Monitored during session     Hand Dominance Right   Extremity/Trunk Assessment Upper Extremity Assessment Upper Extremity Assessment: Overall WFL for tasks assessed   Lower Extremity Assessment Lower Extremity Assessment: Defer to PT evaluation   Cervical / Trunk Assessment Cervical / Trunk Assessment: Back Surgery   Communication Communication Communication: No difficulties   Cognition Arousal/Alertness: Awake/alert Behavior During Therapy: WFL for tasks assessed/performed;Impulsive Overall Cognitive Status: Within Functional Limits for tasks assessed                                       General Comments  VSS on RA            Home Living Family/patient expects to be discharged to:: Private residence Living Arrangements: Spouse/significant other Available Help at Discharge: Family;Friend(s);Available 24 hours/day Type of Home: House Home Access: Stairs to enter     Home Layout: One level     Bathroom Shower/Tub: Occupational psychologist: Handicapped height Bathroom Accessibility: Yes   Home Equipment: Conservation officer, nature (2 wheels);BSC/3in1;Shower seat;Adaptive equipment Adaptive Equipment: Reacher;Sock aid;Long-handled sponge Additional Comments: 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 She reports she does not have to physicall lift or move him      Prior Functioning/Environment Prior Level of Function : Independent/Modified Independent             Mobility Comments: uses RW intermittently in the home and in community ADLs Comments: Reports mod I, some days "harder" than others. pt expressed  concern with how hard showers are for her        OT Problem List: Decreased range of motion;Decreased strength;Decreased activity tolerance;Impaired balance (sitting and/or standing);Decreased safety awareness;Decreased knowledge of use of DME or AE;Decreased knowledge of precautions;Pain      OT Treatment/Interventions:      OT Goals(Current goals can be found in the care plan section) Acute Rehab OT Goals Patient Stated Goal: home OT Goal Formulation: With patient      AM-PAC OT "6 Clicks" Daily Activity     Outcome Measure Help from another person eating meals?: None Help from another person taking care of personal grooming?: A Little Help from another person toileting, which includes using toliet, bedpan, or urinal?: A Little Help from another person bathing (including washing, rinsing, drying)?: A Little Help from another person to put on and taking off regular upper body clothing?: A Little Help from another person to put on and taking off regular lower body clothing?: A Little 6 Click Score: 19   End of Session Equipment Utilized During Treatment: Rolling walker (2 wheels);Back brace Nurse Communication: Mobility status  Activity Tolerance: Patient tolerated treatment well Patient left: in chair;with call bell/phone within reach  OT Visit Diagnosis: Unsteadiness on feet (R26.81);Other abnormalities of gait and mobility (R26.89);Repeated falls (R29.6);Muscle weakness (generalized) (M62.81);Pain                Time: 1062-6948 OT Time Calculation (min): 21 min Charges:  OT General Charges $OT Visit: 1 Visit OT Evaluation $OT Eval Moderate Complexity: 1 Mod   Daundre Biel A Jaymere Alen 09/16/2021,  9:04 AM

## 2021-09-16 NOTE — Evaluation (Signed)
Physical Therapy Evaluation Patient Details Name: Brandi Mcmahon MRN: [id number] DOB: 1941/06/05 Today's Date: 09/16/2021  History of Present Illness  Brandi Mcmahon is a 80 yo female who is now s/p Posterior lumbar interbody fusion L4-5 L5-S1 on 11/21. PMHx: anciety, arthritis, cancer, GERm hyperlipidemia, hypertension, lumbar lami 05/2021   Clinical Impression  PT eval complete. Pt required min guard assist transfers, min guard assist ambulation 150' with RW, and min guard assist ascend/descend 4 steps with rail. Pt demonstrates good understanding of back precautions and brace wear schedule. She reports family able to provide assist at home. Recommend HHPT for continued mobility training upon d/c.       Recommendations for follow up therapy are one component of a multi-disciplinary discharge planning process, led by the attending physician.  Recommendations may be updated based on patient status, additional functional criteria and insurance authorization.  Follow Up Recommendations Home health PT    Assistance Recommended at Discharge Frequent or constant Supervision/Assistance  Functional Status Assessment Patient has had a recent decline in their functional status and demonstrates the ability to make significant improvements in function in a reasonable and predictable amount of time.  Equipment Recommendations  None recommended by PT    Recommendations for Other Services       Precautions / Restrictions Precautions Precautions: Fall;Back Precaution Booklet Issued: Yes (comment) Precaution Comments: reviewed 3/3 back precautions. Handout provided by OT. Required Braces or Orthoses: Spinal Brace Spinal Brace: Lumbar corset;Applied in sitting position Restrictions Weight Bearing Restrictions: No      Mobility  Bed Mobility Overal bed mobility: Needs Assistance Bed Mobility: Rolling;Sidelying to Sit Rolling: Supervision Sidelying to sit: Supervision       General bed  mobility comments: demonstrated great ability to log roll    Transfers Overall transfer level: Needs assistance Equipment used: Rolling walker (2 wheels) Transfers: Sit to/from Stand Sit to Stand: Min guard           General transfer comment: cues for sequencing    Ambulation/Gait Ambulation/Gait assistance: Min guard Gait Distance (Feet): 150 Feet Assistive device: Rolling walker (2 wheels) Gait Pattern/deviations: Step-through pattern;Decreased stride length;Antalgic Gait velocity: decreased Gait velocity interpretation: <1.31 ft/sec, indicative of household ambulator   General Gait Details: slow, steady gait with RW  Stairs Stairs: Yes Stairs assistance: Min guard Stair Management: One rail Right;Step to pattern;Sideways Number of Stairs: 4 General stair comments: Pt demo good technique.  Wheelchair Mobility    Modified Rankin (Stroke Patients Only)       Balance Overall balance assessment: Needs assistance Sitting-balance support: Feet supported Sitting balance-Leahy Scale: Fair     Standing balance support: Single extremity supported;During functional activity;Bilateral upper extremity supported Standing balance-Leahy Scale: Poor Standing balance comment: reliant on UE support                             Pertinent Vitals/Pain Pain Assessment: Faces Faces Pain Scale: Hurts even more Pain Location: back Pain Descriptors / Indicators: Grimacing;Guarding;Tightness Pain Intervention(s): Monitored during session;Repositioned    Home Living Family/patient expects to be discharged to:: Private residence Living Arrangements: Spouse/significant other Available Help at Discharge: Family;Friend(s);Available 24 hours/day Type of Home: House Home Access: Stairs to enter Entrance Stairs-Rails: Psychiatric nurse of Steps: 4   Home Layout: One level Home Equipment: Conservation officer, nature (2 wheels);BSC/3in1;Shower seat;Adaptive  equipment Additional Comments: 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 She reports she does not have to  physicall lift or move him    Prior Function Prior Level of Function : Independent/Modified Independent             Mobility Comments: uses RW intermittently in the home and in community ADLs Comments: Reports mod I, some days "harder" than others. pt expressed concern with how hard showers are for her     Hand Dominance   Dominant Hand: Right    Extremity/Trunk Assessment   Upper Extremity Assessment Upper Extremity Assessment: Overall WFL for tasks assessed    Lower Extremity Assessment Lower Extremity Assessment: Generalized weakness    Cervical / Trunk Assessment Cervical / Trunk Assessment: Back Surgery  Communication   Communication: No difficulties  Cognition Arousal/Alertness: Awake/alert Behavior During Therapy: WFL for tasks assessed/performed;Impulsive Overall Cognitive Status: Within Functional Limits for tasks assessed                                          General Comments General comments (skin integrity, edema, etc.): VSS on RA    Exercises     Assessment/Plan    PT Assessment All further PT needs can be met in the next venue of care  PT Problem List Decreased strength;Decreased mobility;Decreased activity tolerance;Decreased knowledge of precautions;Decreased balance;Pain       PT Treatment Interventions      PT Goals (Current goals can be found in the Care Plan section)  Acute Rehab PT Goals Patient Stated Goal: home PT Goal Formulation: All assessment and education complete, DC therapy    Frequency     Barriers to discharge        Co-evaluation               AM-PAC PT "6 Clicks" Mobility  Outcome Measure Help needed turning from your back to your side while in a flat bed without using bedrails?: A Little Help needed moving from lying on your back to sitting on the side of a flat  bed without using bedrails?: A Little Help needed moving to and from a bed to a chair (including a wheelchair)?: A Little Help needed standing up from a chair using your arms (e.g., wheelchair or bedside chair)?: A Little Help needed to walk in hospital room?: A Little Help needed climbing 3-5 steps with a railing? : A Little 6 Click Score: 18    End of Session Equipment Utilized During Treatment: Gait belt;Back brace Activity Tolerance: Patient tolerated treatment well Patient left: in chair;with call bell/phone within reach Nurse Communication: Mobility status PT Visit Diagnosis: Pain;Difficulty in walking, not elsewhere classified (R26.2);Muscle weakness (generalized) (M62.81)    Time: 7425-9563 PT Time Calculation (min) (ACUTE ONLY): 16 min   Charges:   PT Evaluation $PT Eval Moderate Complexity: 1 Mod          Lorrin Goodell, PT  Office # [phone] Pager [phone]   Lorriane Shire 09/16/2021, 9:48 AM

## 2021-10-16 ENCOUNTER — Other Ambulatory Visit (HOSPITAL_COMMUNITY): Payer: Self-pay | Admitting: Student

## 2021-10-16 ENCOUNTER — Ambulatory Visit (HOSPITAL_COMMUNITY)
Admission: RE | Admit: 2021-10-16 | Discharge: 2021-10-16 | Disposition: A | Discharge status: Home / Self Care | Payer: Medicare Other | Source: Ambulatory Visit | Attending: Neurosurgery | Admitting: Neurosurgery

## 2021-10-16 ENCOUNTER — Other Ambulatory Visit: Payer: Self-pay

## 2021-10-16 DIAGNOSIS — R52 Pain, unspecified: Secondary | ICD-10-CM | POA: Diagnosis present

## 2021-11-27 ENCOUNTER — Encounter: Payer: Self-pay | Admitting: Gastroenterology

## 2021-12-23 ENCOUNTER — Other Ambulatory Visit: Payer: Self-pay | Admitting: *Deleted

## 2021-12-23 DIAGNOSIS — M79606 Pain in leg, unspecified: Secondary | ICD-10-CM

## 2021-12-24 ENCOUNTER — Other Ambulatory Visit: Payer: Self-pay

## 2021-12-24 ENCOUNTER — Ambulatory Visit: Payer: Medicare Other | Admitting: Vascular Surgery

## 2021-12-24 ENCOUNTER — Encounter: Payer: Self-pay | Admitting: Vascular Surgery

## 2021-12-24 ENCOUNTER — Ambulatory Visit: Payer: Medicare Other

## 2021-12-24 VITALS — BP 166/86 | HR 103 | Temp 98.1°F | Resp 18 | Ht 62.0 in | Wt 134.6 lb

## 2021-12-24 DIAGNOSIS — I70221 Atherosclerosis of native arteries of extremities with rest pain, right leg: Secondary | ICD-10-CM | POA: Diagnosis not present

## 2021-12-24 NOTE — Progress Notes (Signed)
[facility]  Patient name: Brandi Mcmahon MRN: [id number] DOB: 03-06-1941 Sex: female  REASON FOR CONSULT: Evaluation critical limb ischemia right lower extremity  HPI: Brandi Mcmahon is a 81 y.o. female, who is here today for evaluation.  She is here today with her granddaughter.  She has an extremely complex past history.  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  She reports that she was having back pain and right leg preoperatively.  She underwent initial surgery for the cyst in August 2022 with Dr. Saintclair Halsted and subsequently underwent fusion in November 2022.  She is recovering from this.  She has persisted in having pain in her right leg.  She reports pain is persistent in both legs feel heavy.  She is describing rest pain and reports that she has to arise at night due to pain in her right foot and has some relief with walking.  Fortunately she has no tissue loss.  She does have prior history of abdominal vascular surgery at [facility] over 20 years ago.  She is unclear as to what the procedure was.  She had abdominal pain and describes being told that if something burst she would die.  She reports that she was not having any symptoms of lower extremity ischemia at this time.  She has a chevron, bilateral subcostal incision and reports that she did not have femoral incisions at the time of the surgery.  I do not have any records related to this.  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   GERD (gastroesophageal reflux disease)    Hyperlipidemia    Hypertension     Family History  Problem Relation Age of Onset   Breast cancer Mother    Lung cancer Father    Colon cancer Neg Hx    Colon polyps Neg Hx    Esophageal cancer Neg Hx    Rectal cancer Neg Hx     Stomach cancer Neg Hx     SOCIAL HISTORY: Social History   Socioeconomic History   Marital status: Married    Spouse name: Not on file   Number of children: Not on file   Years of education: Not on file   Highest education level: Not on file  Occupational History   Not on file  Tobacco Use   Smoking status: Some Days    Packs/day: 0.25    Types: Cigarettes   Smokeless tobacco: Never   Tobacco comments:    1-2 per day, trying to quit  Vaping Use   Vaping Use: Never used  Substance and Sexual Activity   Alcohol use: Not Currently   Drug use: Never   Sexual activity: Not on file  Other Topics Concern   Not on file  Social History Narrative   Not on file   Social Determinants of Health   Financial Resource Strain: Not on file  Food Insecurity: Not on file  Transportation Needs: Not on file  Physical Activity: Not on file  Stress: Not on file  Social Connections: Not on file  Intimate Partner Violence: Not on file    Allergies  Allergen Reactions   Amoxicillin-Pot Clavulanate Other (See Comments)    ABDONIMAL PAIN    Aspirin Swelling  Clavulanic Acid Hives   Codeine Hives and Other (See Comments)   Alprazolam Other (See Comments)    OVER SEDATES    Bee Venom Hives   Moxifloxacin Hcl In Nacl Rash   Sulfa Antibiotics Rash and Other (See Comments)   Tramadol     Insomnia    Hydrocodone-Acetaminophen Other (See Comments)    Insomnia     Current Outpatient Medications  Medication Sig Dispense Refill   acetaminophen (TYLENOL) 500 MG tablet Take 500-1,000 mg by mouth every 6 (six) hours as needed for moderate pain.     albuterol (VENTOLIN HFA) 108 (90 Base) MCG/ACT inhaler Inhale 1-2 puffs into the lungs 4 (four) times daily as needed for shortness of breath or wheezing.     Ascorbic Acid (VITAMIN C) 1000 MG tablet Take 1,000 mg by mouth daily.     aspirin EC 81 MG tablet Take 81 mg by mouth daily. Swallow whole.     CALCIUM PO Take 1 tablet by mouth  daily.     Calcium Polycarbophil (FIBERCON PO) Take 1 capsule by mouth 2 (two) times daily.     carboxymethylcellulose (REFRESH PLUS) 0.5 % SOLN Place 1 drop into both eyes 2 (two) times daily as needed (dry/irritated eyes.).     cetirizine (ZYRTEC) 10 MG tablet Take 10 mg by mouth at bedtime.     diazepam (VALIUM) 5 MG tablet Take 5 mg by mouth 3 (three) times daily as needed for anxiety.     docusate sodium (COLACE) 100 MG capsule Take 200-300 mg by mouth at bedtime as needed for mild constipation.     gabapentin (NEURONTIN) 100 MG capsule Take 100 mg by mouth 3 (three) times daily.     Glucosamine HCl-MSM (GLUCOSAMINE-MSM PO) Take 1 tablet by mouth daily.     Hypertonic Nasal Wash (SINUS RINSE NA) Place 1 packet into the nose daily as needed (allergies/sinus issues.).     Ibuprofen-diphenhydrAMINE HCl (ADVIL PM) 200-25 MG CAPS Take 2 tablets by mouth at bedtime as needed (sleep).     lisinopril (ZESTRIL) 20 MG tablet Take 20 mg by mouth daily.     melatonin 3 MG TABS tablet Take 3-6 mg by mouth at bedtime as needed (sleep).     methocarbamol (ROBAXIN) 500 MG tablet Take 1 tablet (500 mg total) by mouth 4 (four) times daily. (Patient not taking: No sig reported) 45 tablet 0   mometasone (NASONEX) 50 MCG/ACT nasal spray Place 2 sprays into the nose daily.     montelukast (SINGULAIR) 10 MG tablet Take 10 mg by mouth at bedtime.     Multiple Vitamin (MULTIVITAMIN WITH MINERALS) TABS tablet Take 1 tablet by mouth at bedtime.     MYRBETRIQ 50 MG TB24 tablet Take 50 mg by mouth daily.     Omega-3 Fatty Acids (FISH OIL ULTRA) 1400 MG CAPS Take 1,400 mg by mouth daily.     oxyCODONE-acetaminophen (PERCOCET) 5-325 MG tablet Take 1 tablet by mouth every 4 (four) hours as needed for severe pain. (Patient not taking: No sig reported) 20 tablet 0   pantoprazole (PROTONIX) 20 MG tablet Take 20 mg by mouth daily before breakfast.     simvastatin (ZOCOR) 20 MG tablet Take 20 mg by mouth at bedtime.      traMADol (ULTRAM) 50 MG tablet Take 1 tablet (50 mg total) by mouth every 6 (six) hours as needed for moderate pain. 30 tablet 0   VITAMIN D PO Take 1 capsule by mouth daily.  No current facility-administered medications for this visit.    REVIEW OF SYSTEMS:  [X]  denotes positive finding, [ ]  denotes negative finding Cardiac  Comments:  Chest pain or chest pressure:    Shortness of breath upon exertion:    Short of breath when lying flat:    Irregular heart rhythm:        Vascular    Pain in calf, thigh, or hip brought on by ambulation: x   Pain in feet at night that wakes you up from your sleep:  x   Blood clot in your veins:    Leg swelling:  x       Pulmonary    Oxygen at home:    Productive cough:     Wheezing:         Neurologic    Sudden weakness in arms or legs:     Sudden numbness in arms or legs:     Sudden onset of difficulty speaking or slurred speech:    Temporary loss of vision in one eye:     Problems with dizziness:         Gastrointestinal    Blood in stool:     Vomited blood:         Genitourinary    Burning when urinating:     Blood in urine:        Psychiatric    Major depression:         Hematologic    Bleeding problems:    Problems with blood clotting too easily:        Skin    Rashes or ulcers:        Constitutional    Fever or chills:      PHYSICAL EXAM: Vitals:   12/24/21 0947  BP: (!) 166/86  Pulse: (!) 103  Resp: 18  Temp: 98.1 F (36.7 C)  TempSrc: Temporal  SpO2: 95%  Weight: 134 lb 9.6 oz (61.1 kg)  Height: 5\' 2"  (1.575 m)    GENERAL: The patient is a well-nourished female, in no acute distress. The vital signs are documented above. CARDIOVASCULAR: 2+ radial pulses bilaterally.  I do not palpate femoral pulses bilaterally.  I do not palpate pedal pulses bilaterally. PULMONARY: There is good air exchange  MUSCULOSKELETAL: There are no major deformities or cyanosis. NEUROLOGIC: No focal weakness or paresthesias are  detected. SKIN: There are no ulcers or rashes noted.  Her right leg in particular foot shows dependent rubor. PSYCHIATRIC: The patient has a normal affect.  DATA:  [facility]invasive [facility] from 11/25/2021 were reviewed.  This reveals an ankle arm index of 0.15 on the right and 0.47 on the left  I reviewed her films in epic.  She does not have any CT scan of her abdomen does have MRI.  Her scout films from her lumbar show extensive calcification in her aorta  MEDICAL ISSUES: Very complicated patient with critical limb ischemia right leg.  I have recommended CT angiogram of her chest abdomen pelvis and runoff.  She has normal renal function.  Explained it is difficult to predict what treatment would be required for her ischemia.  We will have a better understanding once she has her CT scan to determine what type of procedure was done several decades ago at [facility] in [place].  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  Also explained that this could potentially be treated with endovascular means but this would  be highly unlikely with her prior surgery but this will be entertained if possible.  Also explained that she may require open surgery and I did discuss the potential for extra-anatomic bypass if aortic surgery was felt to be too high risk.  We will coordinate CT scan soon as possible at [facility].  She will follow-up with one of my partners in [place] following the CT scan for planning further treatment   Rosetta Posner, MD [facility] Tel [phone] Pager [phone]  Note: Portions of this report may have been transcribed using voice recognition software.  Every effort has been made to ensure accuracy; however, inadvertent computerized transcription errors may still be present.

## 2021-12-26 ENCOUNTER — Other Ambulatory Visit: Payer: Self-pay

## 2021-12-26 DIAGNOSIS — I70221 Atherosclerosis of native arteries of extremities with rest pain, right leg: Secondary | ICD-10-CM

## 2021-12-30 LAB — BUN+CREAT
BUN/Creatinine Ratio: 15 (ref 12–28)
BUN: 13 mg/dL (ref 8–27)
Creatinine, Ser: 0.85 mg/dL (ref 0.57–1.00)
eGFR: 69 mL/min/{1.73_m2} (ref >59)

## 2021-12-31 ENCOUNTER — Other Ambulatory Visit: Payer: Self-pay

## 2021-12-31 DIAGNOSIS — I70221 Atherosclerosis of native arteries of extremities with rest pain, right leg: Secondary | ICD-10-CM

## 2022-01-01 ENCOUNTER — Ambulatory Visit (HOSPITAL_COMMUNITY): Payer: Medicare Other

## 2022-01-05 ENCOUNTER — Ambulatory Visit: Payer: Medicare Other | Admitting: Surgery

## 2022-01-07 ENCOUNTER — Ambulatory Visit (HOSPITAL_COMMUNITY)
Admission: RE | Admit: 2022-01-07 | Discharge: 2022-01-07 | Disposition: A | Discharge status: Home / Self Care | Payer: Medicare Other | Source: Ambulatory Visit | Attending: Vascular Surgery | Admitting: Vascular Surgery

## 2022-01-07 ENCOUNTER — Other Ambulatory Visit: Payer: Self-pay

## 2022-01-07 DIAGNOSIS — I70221 Atherosclerosis of native arteries of extremities with rest pain, right leg: Secondary | ICD-10-CM | POA: Diagnosis present

## 2022-01-07 MED ORDER — IOHEXOL 350 MG/ML SOLN
100.0000 mL | Freq: Once | INTRAVENOUS | Status: AC | PRN
  Administered 2022-01-07: 100 mL via INTRAVENOUS

## 2022-01-19 ENCOUNTER — Ambulatory Visit: Payer: Medicare Other | Admitting: Surgery

## 2022-01-29 ENCOUNTER — Telehealth: Payer: Self-pay

## 2022-01-29 NOTE — Telephone Encounter (Signed)
Brandi Mcmahon called stating she needs a sooner appt for CT Scan results. She states her legs are excruciating pain and she can not stand it. She states she can barely walk or stand which makes it difficult to take care of her husband who's in a wheelchair. She stated the pains are located in her back and radiates down her legs. Tylenol and Nsaid are not helping and she has been crying herself to sleep from the pains. I moved her appt up to see Dr. Carlis Abbott for a CT results ?

## 2022-02-03 ENCOUNTER — Encounter: Payer: Self-pay | Admitting: Vascular Surgery

## 2022-02-03 ENCOUNTER — Ambulatory Visit: Payer: Medicare Other | Admitting: Vascular Surgery

## 2022-02-03 DIAGNOSIS — I70221 Atherosclerosis of native arteries of extremities with rest pain, right leg: Secondary | ICD-10-CM | POA: Insufficient documentation

## 2022-02-03 NOTE — Progress Notes (Addendum)
? ? ?Patient name: Brandi Mcmahon MRN: [id number] DOB: 1941-04-21 Sex: female ? ?REASON FOR CONSULT: Follow-up after CTA for critical limb ischemia with rest pain in the right foot ? ?HPI: ?Brandi Mcmahon is a 81 y.o. 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  She has a complex history and has recently undergone several spine surgeries with Dr. Saintclair Halsted including surgery for synovial cyst in August 2022 and then a subsequent decompressive laminectomy with interbody fusion at L4-L5 and L5-S1 in November 2022.  She describes about a year of persistent pain in the right foot.  She states this is very bothersome and keeps her awake at night.  No tissue loss at this time.  She ultimately saw Dr. Donnetta Hutching on 12/24/2021 in [place] and he scheduled her for CTA scan and follow-up here in [place].  At that time her ABIs were 0.15 on the right and 0.47 on the left.  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 ? ?Past Medical History:  ?Diagnosis Date  ? Allergic rhinitis   ? Allergy   ? seasonal and environmental  ? Anxiety   ? Arthritis   ? 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 GERD (gastroesophageal reflux disease)   ? Hyperlipidemia   ? Hypertension   ? ? ?Past Surgical History:  ?Procedure Laterality Date  ? BREAST LUMPECTOMY Left 11/07/2009  ? for left DCIS  ? BUNIONECTOMY  2009  ? CARDIAC CATHETERIZATION    ? ~ 2004, reported results unremarkable and shoulder symptoms due to bursitis  ? COLONOSCOPY  2019  ? COLONOSCOPY WITH PROPOFOL N/A 10/23/2020  ? Procedure: COLONOSCOPY WITH PROPOFOL;  Surgeon: Mansouraty, Telford Nab., MD;  Location: [facility];  Service: Gastroenterology;  Laterality: N/A;  ? HEMOSTASIS CLIP PLACEMENT  10/23/2020  ? Procedure:  HEMOSTASIS CLIP PLACEMENT;  Surgeon: Irving Copas., MD;  Location: [facility];  Service: Gastroenterology;;  ? LUMBAR LAMINECTOMY/DECOMPRESSION MICRODISCECTOMY Right 06/18/2021  ? Procedure: Laminectomy and Foraminotomy - right - Lumbar four-Lumbar five for resection of synovial cyst;  Surgeon: Kary Kos, MD;  Location: [place];  Service: Neurosurgery;  Laterality: Right;  ? POLYPECTOMY    ? POLYPECTOMY  10/23/2020  ? Procedure: POLYPECTOMY;  Surgeon: Rush Landmark Telford Nab., MD;  Location: [facility];  Service: Gastroenterology;;  ? [place] INJECTION  10/23/2020  ? Procedure: SUBMUCOSAL LIFTING INJECTION;  Surgeon: Irving Copas., MD;  Location: [facility];  Service: Gastroenterology;;  ? [place] INJECTION  10/23/2020  ? Procedure: SUBMUCOSAL TATTOO INJECTION;  Surgeon: Irving Copas., MD;  Location: [facility];  Service: Gastroenterology;;  ? TOTAL ABDOMINAL HYSTERECTOMY  1987  ? ? ?Family History  ?Problem Relation Age of Onset  ? Breast cancer Mother   ? Lung cancer Father   ? Colon cancer Neg Hx   ? Colon polyps Neg Hx   ? Esophageal cancer Neg Hx   ? Rectal cancer Neg Hx   ? Stomach cancer Neg Hx   ? ? ?SOCIAL HISTORY: ?Social History  ? ?Socioeconomic History  ? Marital status: Married  ?  Spouse name: Not on file  ? Number of children: Not on file  ? Years of education: Not on file  ? Highest education level: Not on file  ?Occupational  History  ? Not on file  ?Tobacco Use  ? Smoking status: Some Days  ?  Packs/day: 0.25  ?  Types: Cigarettes  ? Smokeless tobacco: Never  ? Tobacco comments:  ?  1-2 per day, trying to quit  ?Vaping Use  ? Vaping Use: Never used  ?Substance and Sexual Activity  ? Alcohol use: Not Currently  ? Drug use: Never  ? Sexual activity: Not on file  ?Other Topics Concern  ? Not on file  ?Social History Narrative  ? Not on file  ? ?Social Determinants of Health  ? ?Financial Resource Strain: Not on file  ?Food Insecurity: Not on file   ?Transportation Needs: Not on file  ?Physical Activity: Not on file  ?Stress: Not on file  ?Social Connections: Not on file  ?Intimate Partner Violence: Not on file  ? ? ?Allergies  ?Allergen Reactions  ? Amoxicillin-Pot Clavulanate Other (See Comments)  ?  ABDONIMAL PAIN ?  ? Aspirin Swelling  ?   ?  ? Clavulanic Acid Hives  ? Codeine Hives and Other (See Comments)  ? Alprazolam Other (See Comments)  ?  OVER SEDATES ?  ? Bee Venom Hives  ? Moxifloxacin Hcl In Nacl Rash  ? Sulfa Antibiotics Rash and Other (See Comments)  ? Tramadol   ?  Insomnia   ? Hydrocodone-Acetaminophen Other (See Comments)  ?  Insomnia   ? ? ?Current Outpatient Medications  ?Medication Sig Dispense Refill  ? acetaminophen (TYLENOL) 500 MG tablet Take 500-1,000 mg by mouth every 6 (six) hours as needed for moderate pain.    ? albuterol (VENTOLIN HFA) 108 (90 Base) MCG/ACT inhaler Inhale 1-2 puffs into the lungs 4 (four) times daily as needed for shortness of breath or wheezing.    ? Ascorbic Acid (VITAMIN C) 1000 MG tablet Take 1,000 mg by mouth daily.    ? aspirin EC 81 MG tablet Take 81 mg by mouth daily. Swallow whole.    ? CALCIUM PO Take 1 tablet by mouth daily.    ? Calcium Polycarbophil (FIBERCON PO) Take 1 capsule by mouth 2 (two) times daily.    ? carboxymethylcellulose (REFRESH PLUS) 0.5 % SOLN Place 1 drop into both eyes 2 (two) times daily as needed (dry/irritated eyes.).    ? cetirizine (ZYRTEC) 10 MG tablet Take 10 mg by mouth at bedtime.    ? diazepam (VALIUM) 5 MG tablet Take 5 mg by mouth 3 (three) times daily as needed for anxiety.    ? docusate sodium (COLACE) 100 MG capsule Take 200-300 mg by mouth at bedtime as needed for mild constipation.    ? gabapentin (NEURONTIN) 100 MG capsule Take 100 mg by mouth 3 (three) times daily.    ? Glucosamine HCl-MSM (GLUCOSAMINE-MSM PO) Take 1 tablet by mouth daily.    ? Hypertonic Nasal Wash (SINUS RINSE NA) Place 1 packet into the nose daily as needed (allergies/sinus issues.).    ?  Ibuprofen-diphenhydrAMINE HCl (ADVIL PM) 200-25 MG CAPS Take 2 tablets by mouth at bedtime as needed (sleep).    ? lisinopril (ZESTRIL) 20 MG tablet Take 20 mg by mouth daily.    ? melatonin 3 MG TABS tablet Take 3-6 mg by mouth at bedtime as needed (sleep).    ? methocarbamol (ROBAXIN) 500 MG tablet Take 1 tablet (500 mg total) by mouth 4 (four) times daily. 45 tablet 0  ? mometasone (NASONEX) 50 MCG/ACT nasal spray Place 2 sprays into the nose daily.    ?  montelukast (SINGULAIR) 10 MG tablet Take 10 mg by mouth at bedtime.    ? Multiple Vitamin (MULTIVITAMIN WITH MINERALS) TABS tablet Take 1 tablet by mouth at bedtime.    ? MYRBETRIQ 50 MG TB24 tablet Take 50 mg by mouth daily.    ? Omega-3 Fatty Acids (FISH OIL ULTRA) 1400 MG CAPS Take 1,400 mg by mouth daily.    ? oxyCODONE-acetaminophen (PERCOCET) 5-325 MG tablet Take 1 tablet by mouth every 4 (four) hours as needed for severe pain. 20 tablet 0  ? pantoprazole (PROTONIX) 20 MG tablet Take 20 mg by mouth daily before breakfast.    ? simvastatin (ZOCOR) 20 MG tablet Take 20 mg by mouth at bedtime.    ? traMADol (ULTRAM) 50 MG tablet Take 1 tablet (50 mg total) by mouth every 6 (six) hours as needed for moderate pain. 30 tablet 0  ? VITAMIN D PO Take 1 capsule by mouth daily.    ? ?No current facility-administered medications for this visit.  ? ? ?REVIEW OF SYSTEMS:  ?'[X]'$  denotes positive finding, '[ ]'$  denotes negative finding ?Cardiac  Comments:  ?Chest pain or chest pressure:    ?Shortness of breath upon exertion:    ?Short of breath when lying flat:    ?Irregular heart rhythm:    ?    ?Vascular    ?Pain in calf, thigh, or hip brought on by ambulation:    ?Pain in feet at night that wakes you up from your sleep:  x Right foot  ?Blood clot in your veins:    ?Leg swelling:     ?    ?Pulmonary    ?Oxygen at home:    ?Productive cough:     ?Wheezing:     ?    ?Neurologic    ?Sudden weakness in arms or legs:     ?Sudden numbness in arms or legs:     ?Sudden onset of  difficulty speaking or slurred speech:    ?Temporary loss of vision in one eye:     ?Problems with dizziness:     ?    ?Gastrointestinal    ?Blood in stool:     ?Vomited blood:     ?    ?Genitourinary    ?B

## 2022-02-04 ENCOUNTER — Other Ambulatory Visit: Payer: Self-pay

## 2022-02-04 DIAGNOSIS — I70221 Atherosclerosis of native arteries of extremities with rest pain, right leg: Secondary | ICD-10-CM

## 2022-02-05 ENCOUNTER — Encounter (HOSPITAL_COMMUNITY): Payer: Self-pay | Admitting: Vascular Surgery

## 2022-02-06 ENCOUNTER — Other Ambulatory Visit: Payer: Self-pay

## 2022-02-06 ENCOUNTER — Encounter (HOSPITAL_COMMUNITY): Payer: Self-pay | Admitting: Vascular Surgery

## 2022-02-06 NOTE — Progress Notes (Signed)
PCP - Dr. Elyn Aquas [place], [place]) ?Cardiologist - denies ?  ?PPM/ICD - denies ?  ?  ?Chest x-ray - 11/07/09 ?EKG - 06/12/21 ?Stress Test - pt states she had one over 10 years ago, no abnormalities per pt ?ECHO - denies ?Cardiac Cath - 2004 ?  ?Sleep Study - denies ?  ?  ?DM- denies ?  ?ASA/Blood Thinner Instructions: n/a ? ?  ?ERAS Protcol - no, NPO ?  ?  ?COVID TEST- n/a ?  ?  ?Anesthesia review: no ?  ?Patient denies shortness of breath, fever, cough and chest pain. ?  ?  ?All instructions explained to the patient, with a verbal understanding of the material. Patient agrees to go over the instructions while at home for a better understanding. Patient also instructed to notify surgeon of any contact with COVID+ person or if she develops any symptoms. The opportunity to ask questions was provided. ?

## 2022-02-09 ENCOUNTER — Other Ambulatory Visit: Payer: Self-pay

## 2022-02-09 ENCOUNTER — Ambulatory Visit: Payer: Medicare Other | Admitting: Surgery

## 2022-02-09 ENCOUNTER — Encounter (HOSPITAL_COMMUNITY): Payer: Self-pay | Admitting: Vascular Surgery

## 2022-02-09 ENCOUNTER — Inpatient Hospital Stay (HOSPITAL_COMMUNITY): Payer: Medicare Other | Admitting: Anesthesiology

## 2022-02-09 ENCOUNTER — Encounter (HOSPITAL_COMMUNITY)
Admission: RE | Disposition: A | Discharge status: Home Health | Payer: Self-pay | Source: Home / Self Care | Attending: Vascular Surgery

## 2022-02-09 ENCOUNTER — Inpatient Hospital Stay (HOSPITAL_COMMUNITY)
Admission: RE | Admit: 2022-02-09 | Discharge: 2022-02-12 | DRG: 254 | Disposition: A | Discharge status: Home Health | Payer: Medicare Other | Attending: Vascular Surgery | Admitting: Vascular Surgery

## 2022-02-09 ENCOUNTER — Inpatient Hospital Stay (HOSPITAL_COMMUNITY): Payer: Medicare Other

## 2022-02-09 DIAGNOSIS — K219 Gastro-esophageal reflux disease without esophagitis: Secondary | ICD-10-CM | POA: Diagnosis present

## 2022-02-09 DIAGNOSIS — Z87891 Personal history of nicotine dependence: Secondary | ICD-10-CM | POA: Diagnosis not present

## 2022-02-09 DIAGNOSIS — Z981 Arthrodesis status: Secondary | ICD-10-CM

## 2022-02-09 DIAGNOSIS — Z885 Allergy status to narcotic agent status: Secondary | ICD-10-CM | POA: Diagnosis not present

## 2022-02-09 DIAGNOSIS — M1990 Unspecified osteoarthritis, unspecified site: Secondary | ICD-10-CM | POA: Diagnosis not present

## 2022-02-09 DIAGNOSIS — Z880 Allergy status to penicillin: Secondary | ICD-10-CM | POA: Diagnosis not present

## 2022-02-09 DIAGNOSIS — F17210 Nicotine dependence, cigarettes, uncomplicated: Secondary | ICD-10-CM | POA: Diagnosis present

## 2022-02-09 DIAGNOSIS — I739 Peripheral vascular disease, unspecified: Principal | ICD-10-CM

## 2022-02-09 DIAGNOSIS — Z853 Personal history of malignant neoplasm of breast: Secondary | ICD-10-CM | POA: Diagnosis not present

## 2022-02-09 DIAGNOSIS — I70221 Atherosclerosis of native arteries of extremities with rest pain, right leg: Secondary | ICD-10-CM | POA: Diagnosis present

## 2022-02-09 DIAGNOSIS — I10 Essential (primary) hypertension: Secondary | ICD-10-CM | POA: Diagnosis present

## 2022-02-09 DIAGNOSIS — Z803 Family history of malignant neoplasm of breast: Secondary | ICD-10-CM | POA: Diagnosis not present

## 2022-02-09 DIAGNOSIS — Z91030 Bee allergy status: Secondary | ICD-10-CM | POA: Diagnosis not present

## 2022-02-09 DIAGNOSIS — Z79899 Other long term (current) drug therapy: Secondary | ICD-10-CM

## 2022-02-09 DIAGNOSIS — Z882 Allergy status to sulfonamides status: Secondary | ICD-10-CM | POA: Diagnosis not present

## 2022-02-09 DIAGNOSIS — F419 Anxiety disorder, unspecified: Secondary | ICD-10-CM

## 2022-02-09 DIAGNOSIS — Z888 Allergy status to other drugs, medicaments and biological substances status: Secondary | ICD-10-CM | POA: Diagnosis not present

## 2022-02-09 DIAGNOSIS — E785 Hyperlipidemia, unspecified: Secondary | ICD-10-CM | POA: Diagnosis present

## 2022-02-09 DIAGNOSIS — Z7982 Long term (current) use of aspirin: Secondary | ICD-10-CM | POA: Diagnosis not present

## 2022-02-09 DIAGNOSIS — G8929 Other chronic pain: Secondary | ICD-10-CM | POA: Diagnosis present

## 2022-02-09 HISTORY — PX: INSERTION OF ILIAC STENT: SHX6256

## 2022-02-09 HISTORY — PX: PATCH ANGIOPLASTY: SHX6230

## 2022-02-09 HISTORY — PX: AORTOGRAM: SHX6300

## 2022-02-09 HISTORY — PX: ULTRASOUND GUIDANCE FOR VASCULAR ACCESS: SHX6516

## 2022-02-09 HISTORY — PX: ENDARTERECTOMY FEMORAL: SHX5804

## 2022-02-09 HISTORY — PX: INTRAOPERATIVE ARTERIOGRAM: SHX5157

## 2022-02-09 LAB — CBC
HCT: 38.4 % (ref 36.0–46.0)
HCT: 28.8 % — ABNORMAL LOW (ref 36.0–46.0)
Hemoglobin: 12.8 g/dL (ref 12.0–15.0)
Hemoglobin: 9.6 g/dL — ABNORMAL LOW (ref 12.0–15.0)
MCH: 30.4 pg (ref 26.0–34.0)
MCH: 30.6 pg (ref 26.0–34.0)
MCHC: 33.3 g/dL (ref 30.0–36.0)
MCHC: 33.3 g/dL (ref 30.0–36.0)
MCV: 91.2 fL (ref 80.0–100.0)
MCV: 91.7 fL (ref 80.0–100.0)
Platelets: 284 10*3/uL (ref 150–400)
Platelets: 213 10*3/uL (ref 150–400)
RBC: 4.21 MIL/uL (ref 3.87–5.11)
RBC: 3.14 MIL/uL — ABNORMAL LOW (ref 3.87–5.11)
RDW: 14.6 % (ref 11.5–15.5)
RDW: 14.6 % (ref 11.5–15.5)
WBC: 7.8 10*3/uL (ref 4.0–10.5)
WBC: 7.1 10*3/uL (ref 4.0–10.5)
nRBC: 0 % (ref 0.0–0.2)
nRBC: 0 % (ref 0.0–0.2)

## 2022-02-09 LAB — COMPREHENSIVE METABOLIC PANEL
ALT: 12 U/L (ref 0–44)
AST: 18 U/L (ref 15–41)
Albumin: 3.8 g/dL (ref 3.5–5.0)
Alkaline Phosphatase: 90 U/L (ref 38–126)
Anion gap: 9 (ref 5–15)
BUN: 14 mg/dL (ref 8–23)
CO2: 24 mmol/L (ref 22–32)
Calcium: 9.2 mg/dL (ref 8.9–10.3)
Chloride: 104 mmol/L (ref 98–111)
Creatinine, Ser: 0.92 mg/dL (ref 0.44–1.00)
GFR, Estimated: >60 mL/min (ref >60)
Glucose, Bld: 99 mg/dL (ref 70–99)
Potassium: 4.2 mmol/L (ref 3.5–5.1)
Sodium: 137 mmol/L (ref 135–145)
Total Bilirubin: 0.3 mg/dL (ref 0.3–1.2)
Total Protein: 6.8 g/dL (ref 6.5–8.1)

## 2022-02-09 LAB — PROTIME-INR
INR: 1.1 (ref 0.8–1.2)
Prothrombin Time: 13.8 seconds (ref 11.4–15.2)

## 2022-02-09 LAB — TYPE AND SCREEN
ABO/RH(D): O POS
Antibody Screen: NEGATIVE

## 2022-02-09 LAB — POCT ACTIVATED CLOTTING TIME
Activated Clotting Time: 233 seconds
Activated Clotting Time: 335 seconds
Activated Clotting Time: 221 seconds
Activated Clotting Time: 245 seconds

## 2022-02-09 LAB — APTT: aPTT: 36 seconds (ref 24–36)

## 2022-02-09 LAB — SURGICAL PCR SCREEN
MRSA, PCR: NEGATIVE
Staphylococcus aureus: NEGATIVE

## 2022-02-09 LAB — CREATININE, SERUM
Creatinine, Ser: 0.77 mg/dL (ref 0.44–1.00)
GFR, Estimated: >60 mL/min (ref >60)

## 2022-02-09 SURGERY — ENDARTERECTOMY, FEMORAL
Anesthesia: General | Site: Groin | Laterality: Right

## 2022-02-09 MED ORDER — SODIUM CHLORIDE 0.9 % IV SOLN: INTRAVENOUS | Status: DC

## 2022-02-09 MED ORDER — PHENOL 1.4 % MT LIQD: 1.0000 | OROMUCOSAL | Status: DC | PRN

## 2022-02-09 MED ORDER — PROPOFOL 500 MG/50ML IV EMUL
INTRAVENOUS | Status: DC | PRN
  Administered 2022-02-09: 25 ug/kg/min via INTRAVENOUS

## 2022-02-09 MED ORDER — HEPARIN SODIUM (PORCINE) 1000 UNIT/ML IJ SOLN
INTRAMUSCULAR | Status: AC
  Filled 2022-02-09: qty 30

## 2022-02-09 MED ORDER — SUGAMMADEX SODIUM 200 MG/2ML IV SOLN
INTRAVENOUS | Status: DC | PRN
  Administered 2022-02-09: 200 mg via INTRAVENOUS

## 2022-02-09 MED ORDER — PROPOFOL 10 MG/ML IV BOLUS
INTRAVENOUS | Status: DC | PRN
  Administered 2022-02-09: 90 mg via INTRAVENOUS

## 2022-02-09 MED ORDER — FENTANYL CITRATE (PF) 250 MCG/5ML IJ SOLN
INTRAMUSCULAR | Status: DC | PRN
  Administered 2022-02-09 (×4): 50 ug via INTRAVENOUS

## 2022-02-09 MED ORDER — ACETAMINOPHEN 325 MG PO TABS: 325.0000 mg | ORAL_TABLET | ORAL | Status: DC | PRN

## 2022-02-09 MED ORDER — BISACODYL 5 MG PO TBEC: 5.0000 mg | DELAYED_RELEASE_TABLET | Freq: Every day | ORAL | Status: DC | PRN

## 2022-02-09 MED ORDER — HEPARIN 6000 UNIT IRRIGATION SOLUTION
Status: AC
  Filled 2022-02-09: qty 500

## 2022-02-09 MED ORDER — FENTANYL CITRATE (PF) 100 MCG/2ML IJ SOLN
INTRAMUSCULAR | Status: AC
  Filled 2022-02-09: qty 2

## 2022-02-09 MED ORDER — 0.9 % SODIUM CHLORIDE (POUR BTL) OPTIME
TOPICAL | Status: DC | PRN
  Administered 2022-02-09: 2000 mL

## 2022-02-09 MED ORDER — PROPOFOL 10 MG/ML IV BOLUS
INTRAVENOUS | Status: AC
  Filled 2022-02-09: qty 20

## 2022-02-09 MED ORDER — VANCOMYCIN HCL IN DEXTROSE 1-5 GM/200ML-% IV SOLN: 1000.0000 mg | INTRAVENOUS | Status: AC

## 2022-02-09 MED ORDER — FENTANYL CITRATE (PF) 250 MCG/5ML IJ SOLN
INTRAMUSCULAR | Status: AC
  Filled 2022-02-09: qty 5

## 2022-02-09 MED ORDER — PANTOPRAZOLE SODIUM 20 MG PO TBEC
20.0000 mg | DELAYED_RELEASE_TABLET | Freq: Every day | ORAL | Status: DC
  Administered 2022-02-10 – 2022-02-12 (×3): 20 mg via ORAL
  Filled 2022-02-09 (×3): qty 1

## 2022-02-09 MED ORDER — CHLORHEXIDINE GLUCONATE CLOTH 2 % EX PADS: 6.0000 | MEDICATED_PAD | Freq: Once | CUTANEOUS | Status: DC

## 2022-02-09 MED ORDER — DEXMEDETOMIDINE (PRECEDEX) IN NS 20 MCG/5ML (4 MCG/ML) IV SYRINGE
PREFILLED_SYRINGE | INTRAVENOUS | Status: AC
  Filled 2022-02-09: qty 5

## 2022-02-09 MED ORDER — ACETAMINOPHEN 650 MG RE SUPP: 325.0000 mg | RECTAL | Status: DC | PRN

## 2022-02-09 MED ORDER — LISINOPRIL 20 MG PO TABS
20.0000 mg | ORAL_TABLET | Freq: Every day | ORAL | Status: DC
  Administered 2022-02-10 – 2022-02-12 (×3): 20 mg via ORAL
  Filled 2022-02-09 (×3): qty 1

## 2022-02-09 MED ORDER — VANCOMYCIN HCL 1000 MG IV SOLR
INTRAVENOUS | Status: DC | PRN
  Administered 2022-02-09: 1000 mg via INTRAVENOUS

## 2022-02-09 MED ORDER — MONTELUKAST SODIUM 10 MG PO TABS
10.0000 mg | ORAL_TABLET | Freq: Every day | ORAL | Status: DC
  Administered 2022-02-09 – 2022-02-11 (×3): 10 mg via ORAL
  Filled 2022-02-09 (×3): qty 1

## 2022-02-09 MED ORDER — PROTAMINE SULFATE 10 MG/ML IV SOLN
INTRAVENOUS | Status: AC
  Filled 2022-02-09: qty 5

## 2022-02-09 MED ORDER — DOCUSATE SODIUM 100 MG PO CAPS
100.0000 mg | ORAL_CAPSULE | Freq: Every day | ORAL | Status: DC
  Administered 2022-02-10 – 2022-02-11 (×2): 100 mg via ORAL
  Filled 2022-02-09 (×3): qty 1

## 2022-02-09 MED ORDER — MAGNESIUM SULFATE 2 GM/50ML IV SOLN: 2.0000 g | Freq: Every day | INTRAVENOUS | Status: DC | PRN

## 2022-02-09 MED ORDER — HYDROMORPHONE HCL 1 MG/ML IJ SOLN: 0.5000 mg | INTRAMUSCULAR | Status: DC | PRN

## 2022-02-09 MED ORDER — ROCURONIUM BROMIDE 10 MG/ML (PF) SYRINGE
PREFILLED_SYRINGE | INTRAVENOUS | Status: AC
  Filled 2022-02-09: qty 20

## 2022-02-09 MED ORDER — ALBUTEROL SULFATE (2.5 MG/3ML) 0.083% IN NEBU: 2.0000 mL | INHALATION_SOLUTION | Freq: Four times a day (QID) | RESPIRATORY_TRACT | Status: DC | PRN

## 2022-02-09 MED ORDER — HEPARIN 6000 UNIT IRRIGATION SOLUTION
Status: DC | PRN
  Administered 2022-02-09: 1

## 2022-02-09 MED ORDER — POTASSIUM CHLORIDE CRYS ER 20 MEQ PO TBCR: 20.0000 meq | EXTENDED_RELEASE_TABLET | Freq: Every day | ORAL | Status: DC | PRN

## 2022-02-09 MED ORDER — SENNOSIDES-DOCUSATE SODIUM 8.6-50 MG PO TABS
1.0000 | ORAL_TABLET | Freq: Every evening | ORAL | Status: DC | PRN
  Administered 2022-02-10 – 2022-02-11 (×2): 1 via ORAL
  Filled 2022-02-09 (×2): qty 1

## 2022-02-09 MED ORDER — GUAIFENESIN-DM 100-10 MG/5ML PO SYRP: 15.0000 mL | ORAL_SOLUTION | ORAL | Status: DC | PRN

## 2022-02-09 MED ORDER — LACTATED RINGERS IV SOLN: INTRAVENOUS | Status: DC

## 2022-02-09 MED ORDER — ORAL CARE MOUTH RINSE: 15.0000 mL | Freq: Once | OROMUCOSAL | Status: AC

## 2022-02-09 MED ORDER — OXYCODONE HCL 5 MG PO TABS
5.0000 mg | ORAL_TABLET | ORAL | Status: DC | PRN
  Administered 2022-02-09 – 2022-02-10 (×2): 10 mg via ORAL
  Filled 2022-02-09 (×2): qty 2

## 2022-02-09 MED ORDER — METOPROLOL TARTRATE 5 MG/5ML IV SOLN: 2.0000 mg | INTRAVENOUS | Status: DC | PRN

## 2022-02-09 MED ORDER — SODIUM CHLORIDE 0.9 % IV SOLN: 500.0000 mL | Freq: Once | INTRAVENOUS | Status: DC | PRN

## 2022-02-09 MED ORDER — ONDANSETRON HCL 4 MG/2ML IJ SOLN
INTRAMUSCULAR | Status: DC | PRN
Start: 2022-02-09 — End: 2022-02-09
  Administered 2022-02-09: 4 mg via INTRAVENOUS

## 2022-02-09 MED ORDER — ROCURONIUM BROMIDE 10 MG/ML (PF) SYRINGE
PREFILLED_SYRINGE | INTRAVENOUS | Status: DC | PRN
  Administered 2022-02-09: 20 mg via INTRAVENOUS
  Administered 2022-02-09: 40 mg via INTRAVENOUS
  Administered 2022-02-09 (×2): 20 mg via INTRAVENOUS

## 2022-02-09 MED ORDER — HEPARIN SODIUM (PORCINE) 1000 UNIT/ML IJ SOLN
INTRAMUSCULAR | Status: DC | PRN
  Administered 2022-02-09: 4000 [IU] via INTRAVENOUS
  Administered 2022-02-09 (×2): 2000 [IU] via INTRAVENOUS

## 2022-02-09 MED ORDER — LIDOCAINE 2% (20 MG/ML) 5 ML SYRINGE
INTRAMUSCULAR | Status: DC | PRN
  Administered 2022-02-09: 60 mg via INTRAVENOUS

## 2022-02-09 MED ORDER — HYDRALAZINE HCL 20 MG/ML IJ SOLN: 5.0000 mg | INTRAMUSCULAR | Status: DC | PRN

## 2022-02-09 MED ORDER — MIDAZOLAM HCL 2 MG/2ML IJ SOLN
INTRAMUSCULAR | Status: DC | PRN
  Administered 2022-02-09: 2 mg via INTRAVENOUS

## 2022-02-09 MED ORDER — HEMOSTATIC AGENTS (NO CHARGE) OPTIME
TOPICAL | Status: DC | PRN
  Administered 2022-02-09: 1 via TOPICAL

## 2022-02-09 MED ORDER — VANCOMYCIN HCL IN DEXTROSE 1-5 GM/200ML-% IV SOLN
1000.0000 mg | Freq: Two times a day (BID) | INTRAVENOUS | Status: AC
  Administered 2022-02-09 – 2022-02-10 (×2): 1000 mg via INTRAVENOUS
  Filled 2022-02-09 (×2): qty 200

## 2022-02-09 MED ORDER — CHLORHEXIDINE GLUCONATE 0.12 % MT SOLN: 15.0000 mL | Freq: Once | OROMUCOSAL | Status: AC

## 2022-02-09 MED ORDER — ONDANSETRON HCL 4 MG/2ML IJ SOLN
INTRAMUSCULAR | Status: AC
  Filled 2022-02-09: qty 2

## 2022-02-09 MED ORDER — MIDAZOLAM HCL 2 MG/2ML IJ SOLN
INTRAMUSCULAR | Status: AC
  Filled 2022-02-09: qty 2

## 2022-02-09 MED ORDER — VANCOMYCIN HCL IN DEXTROSE 1-5 GM/200ML-% IV SOLN
INTRAVENOUS | Status: AC
  Administered 2022-02-09: 1000 mg via INTRAVENOUS
  Filled 2022-02-09: qty 200

## 2022-02-09 MED ORDER — IODIXANOL 320 MG/ML IV SOLN
INTRAVENOUS | Status: DC | PRN
  Administered 2022-02-09: 68 mL

## 2022-02-09 MED ORDER — CHLORHEXIDINE GLUCONATE 0.12 % MT SOLN
OROMUCOSAL | Status: AC
  Administered 2022-02-09: 15 mL via OROMUCOSAL
  Filled 2022-02-09: qty 15

## 2022-02-09 MED ORDER — EPHEDRINE SULFATE-NACL 50-0.9 MG/10ML-% IV SOSY
PREFILLED_SYRINGE | INTRAVENOUS | Status: DC | PRN
  Administered 2022-02-09: 10 mg via INTRAVENOUS

## 2022-02-09 MED ORDER — CLOPIDOGREL BISULFATE 75 MG PO TABS
75.0000 mg | ORAL_TABLET | Freq: Every day | ORAL | Status: DC
  Administered 2022-02-10 – 2022-02-12 (×3): 75 mg via ORAL
  Filled 2022-02-09 (×3): qty 1

## 2022-02-09 MED ORDER — CLOPIDOGREL BISULFATE 75 MG PO TABS
300.0000 mg | ORAL_TABLET | Freq: Every day | ORAL | Status: AC
  Administered 2022-02-09: 300 mg via ORAL
  Filled 2022-02-09: qty 4

## 2022-02-09 MED ORDER — DEXAMETHASONE SODIUM PHOSPHATE 10 MG/ML IJ SOLN
INTRAMUSCULAR | Status: AC
  Filled 2022-02-09: qty 1

## 2022-02-09 MED ORDER — PHENYLEPHRINE HCL-NACL 20-0.9 MG/250ML-% IV SOLN
INTRAVENOUS | Status: DC | PRN
  Administered 2022-02-09: 50 ug/min via INTRAVENOUS

## 2022-02-09 MED ORDER — HEPARIN SODIUM (PORCINE) 5000 UNIT/ML IJ SOLN
5000.0000 [IU] | Freq: Three times a day (TID) | INTRAMUSCULAR | Status: DC
  Administered 2022-02-10 – 2022-02-12 (×7): 5000 [IU] via SUBCUTANEOUS
  Filled 2022-02-09 (×7): qty 1

## 2022-02-09 MED ORDER — SIMVASTATIN 20 MG PO TABS
20.0000 mg | ORAL_TABLET | Freq: Every day | ORAL | Status: DC
  Administered 2022-02-09: 20 mg via ORAL
  Filled 2022-02-09: qty 1

## 2022-02-09 MED ORDER — LABETALOL HCL 5 MG/ML IV SOLN: 10.0000 mg | INTRAVENOUS | Status: DC | PRN

## 2022-02-09 MED ORDER — ALUM & MAG HYDROXIDE-SIMETH 200-200-20 MG/5ML PO SUSP: 15.0000 mL | ORAL | Status: DC | PRN

## 2022-02-09 MED ORDER — FENTANYL CITRATE (PF) 100 MCG/2ML IJ SOLN
25.0000 ug | INTRAMUSCULAR | Status: DC | PRN
  Administered 2022-02-09 (×2): 25 ug via INTRAVENOUS

## 2022-02-09 MED ORDER — GABAPENTIN 100 MG PO CAPS
100.0000 mg | ORAL_CAPSULE | Freq: Two times a day (BID) | ORAL | Status: DC
Start: 2022-02-09 — End: 2022-02-10
  Administered 2022-02-09: 100 mg via ORAL
  Filled 2022-02-09: qty 1

## 2022-02-09 MED ORDER — PROTAMINE SULFATE 10 MG/ML IV SOLN
INTRAVENOUS | Status: DC | PRN
  Administered 2022-02-09: 40 mg via INTRAVENOUS

## 2022-02-09 MED ORDER — DEXAMETHASONE SODIUM PHOSPHATE 10 MG/ML IJ SOLN
INTRAMUSCULAR | Status: DC | PRN
  Administered 2022-02-09: 10 mg via INTRAVENOUS

## 2022-02-09 MED ORDER — LIDOCAINE 2% (20 MG/ML) 5 ML SYRINGE
INTRAMUSCULAR | Status: AC
  Filled 2022-02-09: qty 5

## 2022-02-09 MED ORDER — ONDANSETRON HCL 4 MG/2ML IJ SOLN: 4.0000 mg | Freq: Four times a day (QID) | INTRAMUSCULAR | Status: DC | PRN

## 2022-02-09 SURGICAL SUPPLY — 76 items
ADH SKN CLS APL DERMABOND .7 (GAUZE/BANDAGES/DRESSINGS) ×10
AGENT HMST SPONGE THK3/8 (HEMOSTASIS)
BAG BANDED W/RUBBER/TAPE 36X54 (MISCELLANEOUS) ×2 IMPLANT
BAG COUNTER SPONGE SURGICOUNT (BAG) ×6 IMPLANT
BAG EQP BAND 135X91 W/RBR TAPE (MISCELLANEOUS) ×5
BAG SNAP BAND KOVER 36X36 (MISCELLANEOUS) ×2 IMPLANT
BAG SPNG CNTER NS LX DISP (BAG) ×5
BLADE CLIPPER SURG (BLADE) ×6 IMPLANT
CANISTER SUCT 3000ML PPV (MISCELLANEOUS) ×6 IMPLANT
CATH BEACON 5 .035 65 KMP TIP (CATHETERS) ×2 IMPLANT
CATH OMNI FLUSH .035X70CM (CATHETERS) ×2 IMPLANT
CATH OMNI FLUSH 5F 65CM (CATHETERS) ×4 IMPLANT
CLIP VESOCCLUDE MED 24/CT (CLIP) ×6 IMPLANT
CLIP VESOCCLUDE SM WIDE 24/CT (CLIP) ×6 IMPLANT
COVER DOME SNAP 22 D (MISCELLANEOUS) ×2 IMPLANT
COVER PROBE W GEL 5X96 (DRAPES) ×4 IMPLANT
DERMABOND ADVANCED (GAUZE/BANDAGES/DRESSINGS) ×2
DERMABOND ADVANCED .7 DNX12 (GAUZE/BANDAGES/DRESSINGS) ×6 IMPLANT
DRAIN CHANNEL 15F RND FF W/TCR (WOUND CARE) IMPLANT
DRAIN SNY 10X20 3/4 PERF (WOUND CARE) IMPLANT
DRAPE INCISE IOBAN 66X45 STRL (DRAPES) ×6 IMPLANT
ELECT REM PT RETURN 9FT ADLT (ELECTROSURGICAL) ×6
ELECTRODE REM PT RTRN 9FT ADLT (ELECTROSURGICAL) ×5 IMPLANT
EVACUATOR SILICONE 100CC (DRAIN) IMPLANT
GAUZE 4X4 16PLY ~~LOC~~+RFID DBL (SPONGE) ×2 IMPLANT
GLIDEWIRE ADV .035X260CM (WIRE) ×4 IMPLANT
GLOVE BIO SURGEON STRL SZ 6.5 (GLOVE) ×4 IMPLANT
GLOVE BIO SURGEON STRL SZ7.5 (GLOVE) ×6 IMPLANT
GLOVE BIOGEL PI IND STRL 8 (GLOVE) ×5 IMPLANT
GLOVE BIOGEL PI INDICATOR 8 (GLOVE) ×1
GOWN STRL REUS W/ TWL LRG LVL3 (GOWN DISPOSABLE) ×15 IMPLANT
GOWN STRL REUS W/ TWL XL LVL3 (GOWN DISPOSABLE) ×9 IMPLANT
GOWN STRL REUS W/TWL LRG LVL3 (GOWN DISPOSABLE) ×18
GOWN STRL REUS W/TWL XL LVL3 (GOWN DISPOSABLE) ×6
GRAFT VASC PATCH XENOSURE 1X14 (Vascular Products) ×2 IMPLANT
HEMOSTAT SNOW SURGICEL 2X4 (HEMOSTASIS) ×2 IMPLANT
HEMOSTAT SPONGE AVITENE ULTRA (HEMOSTASIS) IMPLANT
INSERT FOGARTY SM (MISCELLANEOUS) ×6 IMPLANT
KIT BASIN OR (CUSTOM PROCEDURE TRAY) ×6 IMPLANT
KIT ENCORE 26 ADVANTAGE (KITS) ×4 IMPLANT
KIT MICROPUNCTURE NIT STIFF (SHEATH) ×2 IMPLANT
KIT TURNOVER KIT B (KITS) ×6 IMPLANT
LOOP VESSEL MINI RED (MISCELLANEOUS) ×2 IMPLANT
NS IRRIG 1000ML POUR BTL (IV SOLUTION) ×12 IMPLANT
PACK PERIPHERAL VASCULAR (CUSTOM PROCEDURE TRAY) ×6 IMPLANT
PAD ARMBOARD 7.5X6 YLW CONV (MISCELLANEOUS) ×12 IMPLANT
SHEATH BRITE TIP 7FR 35CM (SHEATH) ×2 IMPLANT
SHEATH BRITE TIP 8FR 35CM (SHEATH) ×4 IMPLANT
SHEATH PINNACLE 5F 10CM (SHEATH) ×6 IMPLANT
SPONGE T-LAP 18X18 ~~LOC~~+RFID (SPONGE) ×2 IMPLANT
STAPLER VISISTAT 35W (STAPLE) IMPLANT
STENT VIABAHN VBX 8X59X135 (Permanent Stent) ×2 IMPLANT
STENT VIABAHNBX 8X29X135 (Permanent Stent) ×2 IMPLANT
STOPCOCK MORSE 400PSI 3WAY (MISCELLANEOUS) ×2 IMPLANT
STRIP CLOSURE SKIN 1/2X4 (GAUZE/BANDAGES/DRESSINGS) IMPLANT
SUT MNCRL AB 4-0 PS2 18 (SUTURE) ×10 IMPLANT
SUT PROLENE 5 0 C 1 24 (SUTURE) ×36 IMPLANT
SUT PROLENE 6 0 BV (SUTURE) ×18 IMPLANT
SUT SILK 3 0 (SUTURE)
SUT SILK 3-0 18XBRD TIE 12 (SUTURE) IMPLANT
SUT VIC AB 2-0 CT1 27 (SUTURE) ×12
SUT VIC AB 2-0 CT1 TAPERPNT 27 (SUTURE) ×14 IMPLANT
SUT VIC AB 3-0 SH 27 (SUTURE) ×12
SUT VIC AB 3-0 SH 27X BRD (SUTURE) ×14 IMPLANT
SYR 10ML LL (SYRINGE) ×4 IMPLANT
SYR 20CC LL (SYRINGE) ×2 IMPLANT
SYR MEDRAD MARK V 150ML (SYRINGE) ×6 IMPLANT
TOWEL GREEN STERILE (TOWEL DISPOSABLE) ×6 IMPLANT
TRAY FOLEY MTR SLVR 16FR STAT (SET/KITS/TRAYS/PACK) ×6 IMPLANT
TUBING CIL FLEX 10 FLL-RA (TUBING) ×2 IMPLANT
TUBING INJECTOR 48 (MISCELLANEOUS) ×2 IMPLANT
UNDERPAD 30X36 HEAVY ABSORB (UNDERPADS AND DIAPERS) ×6 IMPLANT
WATER STERILE IRR 1000ML POUR (IV SOLUTION) ×6 IMPLANT
WIRE AMPLATZ SS-J .035X180CM (WIRE) IMPLANT
WIRE AMPLATZ SS-J .035X260CM (WIRE) IMPLANT
WIRE BENTSON .035X145CM (WIRE) ×4 IMPLANT

## 2022-02-09 NOTE — H&P (Signed)
History and Physical Interval Note: ? ?02/09/2022 ?10:05 AM ? ?Brandi Mcmahon  has presented today for surgery, with the diagnosis of critical limb ischemia of right lower extremity.  The various methods of treatment have been discussed with the patient and family. After consideration of risks, benefits and other options for treatment, the patient has consented to  Procedure(s): ?RIGHT COMMON FEMORAL ENDARTERECTOMY (Right) ?INSERTION OF ILIAC STENT (Right) ?BYPASS GRAFT FEMORAL-FEMORAL ARTERY (Bilateral) ?BYPASS GRAFT AXILLA-BIFEMORAL (Bilateral) as a surgical intervention.  The patient's history has been reviewed, patient examined, no change in status, stable for surgery.  I have reviewed the patient's chart and labs.  Questions were answered to the patient's satisfaction.   ? ?Right lower extremity critical limb ischemia with rest pain.  Discussed initial plan for right common femoral endarterectomy with retrograde iliac stenting and if unsuccessful will need ax-bifem. ? ?Brandi Mcmahon ? ?Patient name: Brandi Mcmahon   MRN: [id number]        DOB: 12/20/1940        Sex: female ?  ?REASON FOR CONSULT: Follow-up after CTA for critical limb ischemia with rest pain in the right foot ?  ?HPI: ?Brandi Mcmahon is a 81 y.o. 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  She has a complex history and has recently undergone several spine surgeries with Dr. Saintclair Halsted including surgery for synovial cyst in August 2022 and then a subsequent decompressive laminectomy with interbody fusion at L4-L5 and L5-S1 in November 2022.  She describes about a year of persistent pain in the right foot.  She states this is very bothersome and keeps her awake at night.  No tissue loss at this time.  She ultimately saw Dr. Donnetta Hutching on 12/24/2021 in [place] and he scheduled her for CTA scan and follow-up here  in [place].  At that time her ABIs were 0.15 on the right and 0.47 on the left.  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 ?  ?    ?Past Medical History:  ?Diagnosis Date  ? Allergic rhinitis    ? Allergy    ?  seasonal and environmental  ? Anxiety    ? Arthritis    ? Cancer [facility]) 2011  ?  Left breast for DCIS  ? GERD (gastroesophageal reflux disease)    ? Hyperlipidemia    ? Hypertension    ?  ?  ?     ?Past Surgical History:  ?Procedure Laterality Date  ? BREAST LUMPECTOMY Left 11/07/2009  ?  for left DCIS  ? BUNIONECTOMY   2009  ? CARDIAC CATHETERIZATION      ?  ~ 2004, reported results unremarkable and shoulder symptoms due to bursitis  ? COLONOSCOPY   2019  ? COLONOSCOPY WITH PROPOFOL N/A 10/23/2020  ?  Procedure: COLONOSCOPY WITH PROPOFOL;  Surgeon: Mansouraty, Telford Nab., MD;  Location: [facility];  Service: Gastroenterology;  Laterality: N/A;  ? HEMOSTASIS CLIP PLACEMENT   10/23/2020  ?  Procedure: HEMOSTASIS CLIP PLACEMENT;  Surgeon: Irving Copas., MD;  Location: [facility];  Service: Gastroenterology;;  ? LUMBAR LAMINECTOMY/DECOMPRESSION MICRODISCECTOMY Right 06/18/2021  ?  Procedure: Laminectomy and Foraminotomy - right - Lumbar four-Lumbar five for resection of synovial cyst;  Surgeon: Kary Kos, MD;  Location: [facility];  Service: Neurosurgery;  Laterality: Right;  ? POLYPECTOMY      ? POLYPECTOMY   10/23/2020  ?  Procedure: POLYPECTOMY;  Surgeon: Rush Landmark Telford Nab., MD;  Location: [facility];  Service: Gastroenterology;;  ? [place] INJECTION   10/23/2020  ?  Procedure: SUBMUCOSAL LIFTING INJECTION;  Surgeon: Irving Copas., MD;  Location: [facility];  Service: Gastroenterology;;  ? [place] INJECTION   10/23/2020  ?  Procedure: SUBMUCOSAL TATTOO INJECTION;  Surgeon: Irving Copas., MD;  Location: [facility];   Service: Gastroenterology;;  ? TOTAL ABDOMINAL HYSTERECTOMY   1987  ?  ?  ?     ?Family History  ?Problem Relation Age of Onset  ? Breast cancer Mother    ? Lung cancer Father    ? Colon cancer Neg Hx    ? Colon polyps Neg Hx    ? Esophageal cancer Neg Hx    ? Rectal cancer Neg Hx    ? Stomach cancer Neg Hx    ?  ?  ?SOCIAL HISTORY: ?Social History  ?  ?     ?Socioeconomic History  ? Marital status: Married  ?    Spouse name: Not on file  ? Number of children: Not on file  ? Years of education: Not on file  ? Highest education level: Not on file  ?Occupational History  ? Not on file  ?Tobacco Use  ? Smoking status: Some Days  ?    Packs/day: 0.25  ?    Types: Cigarettes  ? Smokeless tobacco: Never  ? Tobacco comments:  ?    1-2 per day, trying to quit  ?Vaping Use  ? Vaping Use: Never used  ?Substance and Sexual Activity  ? Alcohol use: Not Currently  ? Drug use: Never  ? Sexual activity: Not on file  ?Other Topics Concern  ? Not on file  ?Social History Narrative  ? Not on file  ?  ?Social Determinants of Health  ?  ?Financial Resource Strain: Not on file  ?Food Insecurity: Not on file  ?Transportation Needs: Not on file  ?Physical Activity: Not on file  ?Stress: Not on file  ?Social Connections: Not on file  ?Intimate Partner Violence: Not on file  ?  ?  ?     ?Allergies  ?Allergen Reactions  ? Amoxicillin-Pot Clavulanate Other (See Comments)  ?    ABDONIMAL PAIN ?   ? Aspirin Swelling  ?      ?   ? Clavulanic Acid Hives  ? Codeine Hives and Other (See Comments)  ? Alprazolam Other (See Comments)  ?    OVER SEDATES ?   ? Bee Venom Hives  ? Moxifloxacin Hcl In Nacl Rash  ? Sulfa Antibiotics Rash and Other (See Comments)  ? Tramadol    ?    Insomnia   ? Hydrocodone-Acetaminophen Other (See Comments)  ?    Insomnia   ?  ?  ?      ?Current Outpatient Medications  ?Medication Sig Dispense Refill  ? acetaminophen (TYLENOL) 500 MG tablet Take 500-1,000 mg by mouth every 6 (six) hours as needed for moderate pain.      ?  albuterol (VENTOLIN HFA) 108 (90 Base) MCG/ACT inhaler Inhale 1-2 puffs into the lungs 4 (four) times daily as needed for shortness of breath or wheezing.      ? Ascorbic Acid (VITAMIN C) 1000 MG tablet Take 1,000 mg by mouth daily.      ? aspirin  EC 81 MG tablet Take 81 mg by mouth daily. Swallow whole.      ? CALCIUM PO Take 1 tablet by mouth daily.      ? Calcium Polycarbophil (FIBERCON PO) Take 1 capsule by mouth 2 (two) times daily.      ? carboxymethylcellulose (REFRESH PLUS) 0.5 % SOLN Place 1 drop into both eyes 2 (two) times daily as needed (dry/irritated eyes.).      ? cetirizine (ZYRTEC) 10 MG tablet Take 10 mg by mouth at bedtime.      ? diazepam (VALIUM) 5 MG tablet Take 5 mg by mouth 3 (three) times daily as needed for anxiety.      ? docusate sodium (COLACE) 100 MG capsule Take 200-300 mg by mouth at bedtime as needed for mild constipation.      ? gabapentin (NEURONTIN) 100 MG capsule Take 100 mg by mouth 3 (three) times daily.      ? Glucosamine HCl-MSM (GLUCOSAMINE-MSM PO) Take 1 tablet by mouth daily.      ? Hypertonic Nasal Wash (SINUS RINSE NA) Place 1 packet into the nose daily as needed (allergies/sinus issues.).      ? Ibuprofen-diphenhydrAMINE HCl (ADVIL PM) 200-25 MG CAPS Take 2 tablets by mouth at bedtime as needed (sleep).      ? lisinopril (ZESTRIL) 20 MG tablet Take 20 mg by mouth daily.      ? melatonin 3 MG TABS tablet Take 3-6 mg by mouth at bedtime as needed (sleep).      ? methocarbamol (ROBAXIN) 500 MG tablet Take 1 tablet (500 mg total) by mouth 4 (four) times daily. 45 tablet 0  ? mometasone (NASONEX) 50 MCG/ACT nasal spray Place 2 sprays into the nose daily.      ? montelukast (SINGULAIR) 10 MG tablet Take 10 mg by mouth at bedtime.      ? Multiple Vitamin (MULTIVITAMIN WITH MINERALS) TABS tablet Take 1 tablet by mouth at bedtime.      ? MYRBETRIQ 50 MG TB24 tablet Take 50 mg by mouth daily.      ? Omega-3 Fatty Acids (FISH OIL ULTRA) 1400 MG CAPS Take 1,400 mg by mouth daily.       ? oxyCODONE-acetaminophen (PERCOCET) 5-325 MG tablet Take 1 tablet by mouth every 4 (four) hours as needed for severe pain. 20 tablet 0  ? pantoprazole (PROTONIX) 20 MG tablet Take 20 mg by mouth daily bef

## 2022-02-09 NOTE — Transfer of Care (Signed)
Immediate Anesthesia Transfer of Care Note ? ?Patient: Brandi Mcmahon ? ?Procedure(s) Performed: RIGHT COMMON FEMORAL ENDARTERECTOMY WITH PROFUNDA PLASTY AND SUPERFICIAL FEMORAL ARTERY ENDARTERECTOMY (Right: Groin) ?BILATERAL COMMON ILIAC ANGIOPLASTY WITH STENTS (Bilateral: Groin) ?PATCH ANGIOPLASTY USING XENOSURE 1cm x 14cm BIOLOGIC PATCH (Right: Groin) ?ULTRASOUND GUIDANCE FOR VASCULAR ACCESS LEFT COMMON FEMORAL ARTERY (Bilateral: Groin) ?AORTOGRAM WITH ILIAC ARTERIOGRAM ?RIGHT LOWER EXTREMITY ARTERIOGRAM (Right) ? ?Patient Location: PACU ? ?Anesthesia Type:General ? ?Level of Consciousness: drowsy ? ?Airway & Oxygen Therapy: Patient Spontanous Breathing and Patient connected to face mask oxygen ? ?Post-op Assessment: Report given to RN and Post -op Vital signs reviewed and stable ? ?Post vital signs: Reviewed and stable ? ?Last Vitals:  ?Vitals Value Taken Time  ?BP 136/62 02/09/22 1453  ?Temp 36.3 ?C 02/09/22 1453  ?Pulse 80 02/09/22 1455  ?Resp 16 02/09/22 1455  ?SpO2 94 % 02/09/22 1455  ?Vitals shown include unvalidated device data. ? ?Last Pain:  ?Vitals:  ? 02/09/22 0800  ?TempSrc: Oral  ?PainSc: 0-No pain  ?   ? ?  ? ?Complications: No notable events documented. ?

## 2022-02-09 NOTE — Progress Notes (Signed)
Pt states she is unable to void in pre-op- U/A not sent. Dr. Carlis Abbott aware.  ?

## 2022-02-09 NOTE — Progress Notes (Signed)
Patient arrived to 4E. Vitals taken and stable. Patient placed on tele. CCMD notified. Groin sites assessed. Patient oriented to unit. Call bell within reach.  ?Brandi Mcmahon Brandi Mcmahon  ?

## 2022-02-09 NOTE — Anesthesia Procedure Notes (Signed)
Arterial Line Insertion ?Start/End4/17/2023 9:35 AM, 02/09/2022 9:45 AM ?Performed by: Wilburn Cornelia, CRNA, CRNA ? Patient location: Pre-op. ?Preanesthetic checklist: patient identified, IV checked, site marked, risks and benefits discussed, surgical consent, monitors and equipment checked, pre-op evaluation, timeout performed and anesthesia consent ?Left, radial was placed ?Catheter size: 20 G ?Hand hygiene performed  and maximum sterile barriers used  ?Allen's test indicative of satisfactory collateral circulation ?Attempts: 3 ?Procedure performed using ultrasound guided technique. ?Following insertion, dressing applied and Biopatch. ?Post procedure assessment: normal ? ?Post procedure complications: local hematoma and second provider assisted. ?Additional procedure comments: Dr. Ermalene Postin assisted with 3rd attempt. ? ? ? ?

## 2022-02-09 NOTE — Anesthesia Procedure Notes (Signed)
Procedure Name: Intubation ?Date/Time: 02/09/2022 10:53 AM ?Performed by: Minerva Ends, CRNA ?Pre-anesthesia Checklist: Patient identified, Emergency Drugs available, Suction available and Patient being monitored ?Patient Re-evaluated:Patient Re-evaluated prior to induction ?Oxygen Delivery Method: Circle system utilized ?Preoxygenation: Pre-oxygenation with 100% oxygen ?Induction Type: IV induction ?Ventilation: Mask ventilation without difficulty ?Laryngoscope Size: Mac ?Grade View: Grade I ?Tube type: Oral ?Tube size: 7.0 mm ?Number of attempts: 1 ?Airway Equipment and Method: Stylet and Oral airway ?Placement Confirmation: ETT inserted through vocal cords under direct vision, positive ETCO2 and breath sounds checked- equal and bilateral ?Secured at: 22 cm ?Tube secured with: Tape ?Dental Injury: Teeth and Oropharynx as per pre-operative assessment  ? ? ? ? ?

## 2022-02-09 NOTE — Op Note (Addendum)
Date: February 09, 2022 ? 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 ?Postoperative diagnosis: Same ? ?Procedure: ?1.  Right common femoral artery endarterectomy including profundoplasty and endarterectomy of the proximal SFA with bovine pericardial patch angioplasty ?2.  Ultrasound-guided access left common femoral artery ?3.  Aortogram with bilateral iliac arteriogram ?4.  Bilateral common iliac artery angioplasty with stent placement with kissing stents (8 mm x 59 mm VBX right common iliac artery and 8 mm x 29 mm VBX left common iliac artery ?5.  Right lower extremity arteriogram ? ?Surgeon: Dr. [name], MD ? ?Assistant: Paulo Fruit, PA ? ?Indications: Patient is an 81 year old female who has had chronic pain in the right lower extremity and found to have significant arterial insufficiency with an ABI of 0.15 consistent with rest pain.  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  She presents today for likely hybrid approach with right common femoral endarterectomy and retrograde iliac stenting versus extra-anatomic bypass.  Risks benefits discussed.  An assistant was needed to expedite the case and to help sew the patch and for wire exchange. ? ?Findings: Transverse incision in the right groin and the right common femoral artery had no pulse.  Ultimately endarterectomy was performed of the common femoral artery for approximate 80% calcified stenosis including endarterectomy of the main profunda branch and endarterectomy of the proximal SFA.  There was a significant subtotal occlusion in the right common iliac artery and on further imaging with iliac arteriogram it was apparent that we had to kiss the stents so I got ultrasound access of the left common femoral artery.   Ultimately bilateral common iliac artery kissing stents were placed within the 8 mm x 59 mm VBX in the right common iliac artery and an 8 mm x 29 mm VBX in the left common iliac artery via retrograde approach.  At completion we had pulsatile flow in the right groin and a pulse in the patch.  I was not satisfied with a monophasic signal in the right lower extremity after endarterectomy and stenting.  I performed a right lower extremity arteriogram that showed a flap in the proximal SFA where the endarterectomy had been performed and ended.  I took down the bovine patch and extended the arteriotomy onto the SFA more distally where I could visualize this and this was tacked down with multiple 6-0 Prolene tacking sutures.  A new long bovine pericardial patch was sewn from the right common femoral artery onto the proximal SFA.  Much more brisk flow in the foot with multiphasic signal at completion. ? ?Anesthesia: General ? ?Details: Patient was taken to the operating room after informed consent was obtained.  Placed on the operative table supine position.  General endotracheal anesthesia was induced.  The patient was then prepped and draped in standard sterile fashion for extra-anatomic bypass including both groins and the torso.  Timeout was performed.  Antibiotics were given.  Initially performed a horizontal groin incision in the right groin and dissected down with Bovie cautery and then opened the femoral sheath longitudinally.  Dissected out the SFA and profunda as well as the distal external iliac artery and all branches were controlled with small Vesseloops.  The artery was heavily calcified and quite small.  There was no pulse.  I then gave the patient  100 units/kg IV heparin once ACT was therapeutic I used Vesseloops on SFA and profunda and then a Henley clamp on the distal external iliac artery.  The common femoral artery was then opened with 11 blade scalpel extended with Potts scissors.  Under direct  visualization I was then able to place a Glidewire advantage into the distal external iliac artery under direct visualization.  I then placed a short 5 French sheath and used a KMP catheter to cross the right common iliac artery subtotal occlusion retrograde and got into the aorta.  That point time I then performed endarterectomy of the common femoral including the proximal SFA and profunda.  I had good backbleeding from both the SFA and the profunda.  I then brought a small bovine pericardial patch on the field and this was punctured over the wire and slid all the way down to the groin and then I performed bovine pericardial patch of the common femoral onto the proximal SFA using 5-0 Prolene parachute technique with the help of my assistant.  I de-aired the artery prior to completion.  I put some additional repair sutures in the patch.  I then placed a long 8 French sheath through the patch retrograde up through the iliac over our wire and got multiple hand injections to visualize the right common iliac subtotal occlusion.  Due to significant spinal hardware we had to look at this with a very steep LAO projection in order to have any good visualization.  After multiple pictures, it became apparent that the disease extended all the way to the ostium and I was going to have to kiss the stent with a stent in the left common iliac artery as well.  I then used ultrasound and evaluated the left common femoral artery, it was small but patent, and this was accessed with a micro access, needle placed a microwire, and then a microsheath.  I then crossed the left common iliac retrograde with a Glidewire advantage and placed a 7 [place] sheath.  At that point time we measured 8 mm stents and put an 8 mm x 59 mm VBX in the right common iliac artery and 8 mm x 29 mm VBX in the left common iliac artery at the aortic bifurcation to adequately cover all the diseased segments of the vessel and these were inflated to nominal pressure  at the same time.  Retrograde shot showed widely patent stents bilaterally with no extravasation and good flow.  I then removed my wire up the left leaving the sheath in place in the left common femoral artery.  On the right I pulled the sheath all the way down into the right common femoral and shot a retrograde image showing really a small external iliac but minimal disease.  I elected not to put a stent in the right external iliac artery.  Wires and catheters were removed and I removed the sheath from the patch and tied down a Prolene 5-0 for repair.  I checked signals in the right foot and there was only a monophasic posterior tibial signal.  I was unhappy with this that she had patent infrainguinal runoff.  I reaccessed the sheath with a micro access needle and placed a micro sheath and shot a right lower extremity arteriogram.  This showed a flap in the proximal SFA at the distal endpoint of the endarterectomy that was flow-limiting.  I then ultimately dissected out more of the SFA distal and clamped distal to the flap using a angled DeBakey  and then controlled the proximal external iliac with a Henley clamp.  I elected to take the patch down and extended the arteriotomy onto the SFA and visualize the dissection flap where the endarterectomy had ended.  I tacked all this down with multiple 6-0 Prolene's.  I then brought a bovine pericardial patch on the field and a longer patch was sewn in place with a 5-0 Prolene parachute technique and everything was de-aired prior to completion with the help of my assistant.  Excellent pulse in the patch at completion.  Much better signal in the right foot that was multiphasic.  The right groin was washed out and protamine was given.  The right groin was closed in multiple areas of 2-0 Vicryl, 3-0 Vicryl, 4-0 Monocryl and Dermabond.  Sheath in the left groin removed and manual pressure was held for 20 minutes with good hemostasis. Taken to recovery in stable  condition. ? ?Complication: None ? ?Condition: Stable ? ?[name], MD ?[facility] ?Office: [phone] ? ? ?[name] ? ?

## 2022-02-09 NOTE — Anesthesia Preprocedure Evaluation (Addendum)
Anesthesia Evaluation  ?Patient identified by MRN, date of birth, ID band ?Patient awake ? ? ? ?Reviewed: ?Allergy & Precautions, NPO status , Patient's Chart, lab work & pertinent test results ? ?Airway ?Mallampati: II ? ?TM Distance: >3 FB ?Neck ROM: Full ? ? ? Dental ?no notable dental hx. ? ?  ?Pulmonary ?neg pulmonary ROS, Current Smoker and Patient abstained from smoking.,  ?  ?Pulmonary exam normal ? ? ? ? ? ? ? Cardiovascular ?hypertension, Pt. on medications ?+ Peripheral Vascular Disease (RLE ischemia )  ? ?Rhythm:Regular Rate:Normal ? ? ?  ?Neuro/Psych ?Anxiety negative neurological ROS ?   ? GI/Hepatic ?Neg liver ROS, GERD  Medicated,  ?Endo/Other  ? ? Renal/GU ?negative Renal ROS  ?negative genitourinary ?  ?Musculoskeletal ? ?(+) Arthritis , Osteoarthritis,   ? Abdominal ?Normal abdominal exam  (+)   ?Peds ? Hematology ?  ?Anesthesia Other Findings ? ? Reproductive/Obstetrics ? ?  ? ? ? ? ? ? ? ? ? ? ? ? ? ?  ?  ? ? ? ? ? ? ? ?Anesthesia Physical ?Anesthesia Plan ? ?ASA: 3 ? ?Anesthesia Plan: General  ? ?Post-op Pain Management:   ? ?Induction: Intravenous ? ?PONV Risk Score and Plan: 2 and Ondansetron, Dexamethasone and Treatment may vary due to age or medical condition ? ?Airway Management Planned: Mask and Oral ETT ? ?Additional Equipment: Arterial line ? ?Intra-op Plan:  ? ?Post-operative Plan: Extubation in OR ? ?Informed Consent: I have reviewed the patients History and Physical, chart, labs and discussed the procedure including the risks, benefits and alternatives for the proposed anesthesia with the patient or authorized representative who has indicated his/her understanding and acceptance.  ? ? ? ?Dental advisory given ? ?Plan Discussed with: CRNA ? ?Anesthesia Plan Comments: (    )  ? ? ? ? ? ? ?Anesthesia Quick Evaluation ? ?

## 2022-02-10 ENCOUNTER — Encounter (HOSPITAL_COMMUNITY): Payer: Self-pay | Admitting: Vascular Surgery

## 2022-02-10 LAB — CBC
HCT: 28.4 % — ABNORMAL LOW (ref 36.0–46.0)
Hemoglobin: 9.8 g/dL — ABNORMAL LOW (ref 12.0–15.0)
MCH: 30.7 pg (ref 26.0–34.0)
MCHC: 34.5 g/dL (ref 30.0–36.0)
MCV: 89 fL (ref 80.0–100.0)
Platelets: 236 10*3/uL (ref 150–400)
RBC: 3.19 MIL/uL — ABNORMAL LOW (ref 3.87–5.11)
RDW: 14.5 % (ref 11.5–15.5)
WBC: 9.7 10*3/uL (ref 4.0–10.5)
nRBC: 0 % (ref 0.0–0.2)

## 2022-02-10 LAB — BASIC METABOLIC PANEL
Anion gap: 9 (ref 5–15)
BUN: 9 mg/dL (ref 8–23)
CO2: 23 mmol/L (ref 22–32)
Calcium: 8.1 mg/dL — ABNORMAL LOW (ref 8.9–10.3)
Chloride: 99 mmol/L (ref 98–111)
Creatinine, Ser: 0.8 mg/dL (ref 0.44–1.00)
GFR, Estimated: >60 mL/min (ref >60)
Glucose, Bld: 118 mg/dL — ABNORMAL HIGH (ref 70–99)
Potassium: 4.2 mmol/L (ref 3.5–5.1)
Sodium: 131 mmol/L — ABNORMAL LOW (ref 135–145)

## 2022-02-10 LAB — LIPID PANEL
Cholesterol: 159 mg/dL (ref 0–200)
HDL: 40 mg/dL — ABNORMAL LOW (ref >40)
LDL Cholesterol: 94 mg/dL (ref 0–99)
Total CHOL/HDL Ratio: 4 RATIO
Triglycerides: 123 mg/dL (ref <150)
VLDL: 25 mg/dL (ref 0–40)

## 2022-02-10 MED ORDER — DOCUSATE SODIUM 100 MG PO CAPS: 100.0000 mg | ORAL_CAPSULE | Freq: Every day | ORAL | Status: DC

## 2022-02-10 MED ORDER — GABAPENTIN 100 MG PO CAPS
100.0000 mg | ORAL_CAPSULE | Freq: Three times a day (TID) | ORAL | Status: DC
Start: 2022-02-10 — End: 2022-02-12
  Administered 2022-02-10 – 2022-02-12 (×7): 100 mg via ORAL
  Filled 2022-02-10 (×7): qty 1

## 2022-02-10 MED ORDER — ATORVASTATIN CALCIUM 80 MG PO TABS
80.0000 mg | ORAL_TABLET | Freq: Every day | ORAL | Status: DC
  Administered 2022-02-10 – 2022-02-11 (×2): 80 mg via ORAL
  Filled 2022-02-10 (×2): qty 1

## 2022-02-10 MED ORDER — ASPIRIN EC 81 MG PO TBEC
81.0000 mg | DELAYED_RELEASE_TABLET | Freq: Every day | ORAL | Status: DC
  Administered 2022-02-10 – 2022-02-12 (×3): 81 mg via ORAL
  Filled 2022-02-10 (×3): qty 1

## 2022-02-10 MED ORDER — OMEGA-3-ACID ETHYL ESTERS 1 G PO CAPS
1.0000 g | ORAL_CAPSULE | Freq: Two times a day (BID) | ORAL | Status: DC
  Administered 2022-02-10 – 2022-02-12 (×5): 1 g via ORAL
  Filled 2022-02-10 (×5): qty 1

## 2022-02-10 MED ORDER — ACETAMINOPHEN 500 MG PO TABS
500.0000 mg | ORAL_TABLET | Freq: Every evening | ORAL | Status: DC | PRN
  Administered 2022-02-11: 500 mg via ORAL
  Filled 2022-02-10: qty 1

## 2022-02-10 MED ORDER — ACETAMINOPHEN 500 MG PO TABS: 500.0000 mg | ORAL_TABLET | Freq: Four times a day (QID) | ORAL | Status: DC | PRN

## 2022-02-10 MED ORDER — CALCIUM POLYCARBOPHIL 625 MG PO TABS
625.0000 mg | ORAL_TABLET | Freq: Every day | ORAL | Status: DC
Start: 2022-02-10 — End: 2022-02-12
  Administered 2022-02-10 – 2022-02-12 (×3): 625 mg via ORAL
  Filled 2022-02-10 (×3): qty 1

## 2022-02-10 MED ORDER — DIPHENHYDRAMINE-APAP (SLEEP) 25-500 MG PO TABS: 1.0000 | ORAL_TABLET | Freq: Every evening | ORAL | Status: DC | PRN

## 2022-02-10 MED ORDER — DIPHENHYDRAMINE HCL 12.5 MG/5ML PO ELIX
12.5000 mg | ORAL_SOLUTION | Freq: Every evening | ORAL | Status: DC | PRN
  Filled 2022-02-10: qty 5

## 2022-02-10 MED ORDER — DIAZEPAM 5 MG PO TABS
5.0000 mg | ORAL_TABLET | Freq: Three times a day (TID) | ORAL | Status: DC | PRN
  Administered 2022-02-10 – 2022-02-11 (×2): 5 mg via ORAL
  Filled 2022-02-10 (×2): qty 1

## 2022-02-10 MED ORDER — FISH OIL ULTRA 1400 MG PO CAPS: 1400.0000 mg | ORAL_CAPSULE | Freq: Every day | ORAL | Status: DC

## 2022-02-10 NOTE — Progress Notes (Signed)
Pt. Is not currently receiving IV medications and is stable, eating, and drinking with possible d/c tomorrow. Pt. Also expressing she does not want an IV. Advised RN and patient if condition changes, we are available 24 hrs for IV insertion. ?

## 2022-02-10 NOTE — Anesthesia Postprocedure Evaluation (Signed)
Anesthesia Post Note ? ?Patient: Brandi Mcmahon ? ?Procedure(s) Performed: RIGHT COMMON FEMORAL ENDARTERECTOMY WITH PROFUNDA PLASTY AND SUPERFICIAL FEMORAL ARTERY ENDARTERECTOMY (Right: Groin) ?BILATERAL COMMON ILIAC ANGIOPLASTY WITH STENTS (Bilateral: Groin) ?PATCH ANGIOPLASTY USING XENOSURE 1cm x 14cm BIOLOGIC PATCH (Right: Groin) ?ULTRASOUND GUIDANCE FOR VASCULAR ACCESS LEFT COMMON FEMORAL ARTERY (Bilateral: Groin) ?AORTOGRAM WITH ILIAC ARTERIOGRAM ?RIGHT LOWER EXTREMITY ARTERIOGRAM (Right) ? ?  ? ?Patient location during evaluation: PACU ?Anesthesia Type: General ?Level of consciousness: awake and alert ?Pain management: pain level controlled ?Vital Signs Assessment: post-procedure vital signs reviewed and stable ?Respiratory status: spontaneous breathing, nonlabored ventilation, respiratory function stable and patient connected to nasal cannula oxygen ?Cardiovascular status: blood pressure returned to baseline and stable ?Postop Assessment: no apparent nausea or vomiting ?Anesthetic complications: no ? ? ?No notable events documented. ? ?Last Vitals:  ?Vitals:  ? 02/09/22 2324 02/10/22 0404  ?BP: (!) 140/120 (!) 117/34  ?Pulse: 95 98  ?Resp: 19 12  ?Temp: 36.4 ?C   ?SpO2: 100% 100%  ?  ?Last Pain:  ?Vitals:  ? 02/10/22 0000  ?TempSrc:   ?PainSc: Asleep  ? ? ?  ?  ?  ?  ?  ?  ? ?March Rummage Larwence Tu ? ? ? ? ?

## 2022-02-10 NOTE — Evaluation (Signed)
Physical Therapy Evaluation ?Patient Details ?Name: ROCQUEL ASKREN ?MRN: [id number] ?DOB: 1941-08-19 ?Today's Date: 02/10/2022 ? ?History of Present Illness ? NANAKO STOPHER is a 81 yo female who presents on 4/17 with RLE ischemia now s/p aortogram with bilateral iliac arteriogram, bilateral common iliac artery angioplasty with stent placement, R lower extermity arteriogram and R common femoral artery endarterectomy. PMHx: anxiety, cancer, HTN, multiple back surgeries 05/2021, 08/2022.  ?Clinical Impression ? Patient presents with pain and post surgical deficits s/p above surgery. Pt lives at home with her 2 y/o grandson and spouse (who has dementia). She reports being independent for ADLs and walking and helps care for her husband. Today, pt tolerated transfers and gait training with Min guard assist and use of RW for support. HR up to 120 bpm max with activity. Worried about going home too early. Needs to practice stairs prior to d/c. Will have support of grandson at home. Will follow acutely to maximize independence and mobility prior to return home.  ?   ? ?Recommendations for follow up therapy are one component of a multi-disciplinary discharge planning process, led by the attending physician.  Recommendations may be updated based on patient status, additional functional criteria and insurance authorization. ? ?Follow Up Recommendations Home health PT ? ?  ?Assistance Recommended at Discharge Intermittent Supervision/Assistance  ?Patient can return home with the following ? Assist for transportation;Assistance with cooking/housework;Help with stairs or ramp for entrance;A little help with bathing/dressing/bathroom ? ?  ?Equipment Recommendations Rolling walker (2 wheels)  ?Recommendations for Other Services ?    ?  ?Functional Status Assessment Patient has had a recent decline in their functional status and demonstrates the ability to make significant improvements in function in a reasonable and predictable amount  of time.  ? ?  ?Precautions / Restrictions Precautions ?Precautions: Fall ?Restrictions ?Weight Bearing Restrictions: No  ? ?  ? ?Mobility ? Bed Mobility ?  ?  ?  ?  ?  ?  ?  ?General bed mobility comments: Up in chair upon PT arrival. ?  ? ?Transfers ?Overall transfer level: Needs assistance ?Equipment used: Rolling walker (2 wheels) ?Transfers: Sit to/from Stand ?Sit to Stand: Min guard ?  ?  ?  ?  ?  ?General transfer comment: Min guard for safety. Stood from chair x1, good demo of hand placement. ?  ? ?Ambulation/Gait ?Ambulation/Gait assistance: Min guard ?Gait Distance (Feet): 100 Feet ?Assistive device: Rolling walker (2 wheels) ?Gait Pattern/deviations: Step-through pattern, Decreased stride length, Antalgic ?  ?Gait velocity interpretation: 1.31 - 2.62 ft/sec, indicative of limited community ambulator ?  ?General Gait Details: Slow, antalgic like gait with use of RW. HR up to 120 bpm max witha ctivity. ? ?Stairs ?  ?  ?  ?  ?  ? ?Wheelchair Mobility ?  ? ?Modified Rankin (Stroke Patients Only) ?  ? ?  ? ?Balance Overall balance assessment: Needs assistance ?Sitting-balance support: Feet supported, No upper extremity supported ?Sitting balance-Leahy Scale: Fair ?  ?  ?Standing balance support: During functional activity, Reliant on assistive device for balance ?Standing balance-Leahy Scale: Poor ?Standing balance comment: BUE on RW ?  ?  ?  ?  ?  ?  ?  ?  ?  ?  ?  ?   ? ? ? ?Pertinent Vitals/Pain Pain Assessment ?Pain Assessment: Faces ?Faces Pain Scale: Hurts little more ?Pain Location: left IV site and right groin ?Pain Descriptors / Indicators: Sore, Discomfort, Operative site guarding ?Pain Intervention(s): Monitored during session, Repositioned, Premedicated  before session  ? ? ?Home Living Family/patient expects to be discharged to:: Private residence ?Living Arrangements: Spouse/significant other;Other relatives (102 y/o grandson) ?Available Help at Discharge: Family ?Type of Home: Mobile home ?Home  Access: Stairs to enter ?Entrance Stairs-Rails: Right;Left ?Entrance Stairs-Number of Steps: 4 ?  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 She reports she does not have to physically lift or move him. Her grandson lives there anda ssists as well.  ?  ?Prior Function Prior Level of Function : Independent/Modified Independent ?  ?  ?  ?  ?  ?  ?Mobility Comments: Reports mod I, but also notes from 4 months ago mention using RW PRN. ?ADLs Comments: Mod I ?  ? ? ?Hand Dominance  ? Dominant Hand: Right ? ?  ?Extremity/Trunk Assessment  ? Upper Extremity Assessment ?Upper Extremity Assessment: Defer to OT evaluation ?  ? ?Lower Extremity Assessment ?Lower Extremity Assessment: Generalized weakness (but functional) ?  ? ?Cervical / Trunk Assessment ?Cervical / Trunk Assessment: Kyphotic  ?Communication  ? Communication: No difficulties  ?Cognition Arousal/Alertness: Awake/alert ?Behavior During Therapy: [facility] for tasks assessed/performed ?Overall Cognitive Status: Impaired/Different from baseline ?Area of Impairment: Memory, Following commands, Safety/judgement, Attention ?  ?  ?  ?  ?  ?  ?  ?  ?  ?Current Attention Level: Selective ?Memory: Decreased short-term memory ?Following Commands: Follows one step commands with increased time ?Safety/Judgement: Decreased awareness of safety ?  ?  ?General Comments: Fluctuating mood depending on person present in room. Pleasant witht his therapist, follows commands well. Easily distracted but redirectable. "Here is my other scar," pointing to a place on her left groin (where there is no scar). ?  ?  ? ?  ?General Comments General comments (skin integrity, edema, etc.): HR up to 120 bpm max with activity. ? ?  ?Exercises    ? ?Assessment/Plan  ?  ?PT Assessment Patient needs continued PT services  ?PT Problem List Decreased strength;Decreased mobility;Decreased  safety awareness;Pain;Decreased balance;Decreased cognition ? ?   ?  ?PT Treatment Interventions Therapeutic exercise;Gait training;Patient/family education;Therapeutic activities;Functional mobility training;Stair training;Balance training   ? ?PT Goals (Current goals can be found in the Care Plan section)  ?Acute Rehab PT Goals ?Patient Stated Goal: "not to go home too soon." ?PT Goal Formulation: With patient ?Time For Goal Achievement: 02/24/22 ?Potential to Achieve Goals: Fair ? ?  ?Frequency Min 3X/week ?  ? ? ?Co-evaluation   ?  ?  ?  ?  ? ? ?  ?AM-PAC PT "6 Clicks" Mobility  ?Outcome Measure Help needed turning from your back to your side while in a flat bed without using bedrails?: A Little ?Help needed moving from lying on your back to sitting on the side of a flat bed without using bedrails?: A Little ?Help needed moving to and from a bed to a chair (including a wheelchair)?: A Little ?Help needed standing up from a chair using your arms (e.g., wheelchair or bedside chair)?: A Little ?Help needed to walk in hospital room?: A Little ?Help needed climbing 3-5 steps with a railing? : A Little ?6 Click Score: 18 ? ?  ?End of Session Equipment Utilized During Treatment: Gait belt ?Activity Tolerance: Patient tolerated treatment well ?Patient left: in chair;with call bell/phone within reach;with chair alarm set ?Nurse Communication: Mobility status ?PT Visit Diagnosis: Pain;Other abnormalities of gait and mobility (R26.89) ?Pain - Right/Left: Right ?Pain -  part of body:  (groin and left IV site) ?  ? ?Time: 1308-6578 ?PT Time Calculation (min) (ACUTE ONLY): 20 min ? ? ?Charges:   PT Evaluation ?$PT Eval Moderate Complexity: 1 Mod ?  ?  ?   ? ? ?Marisa Severin, PT, DPT ?Acute Rehabilitation Services ?Secure chat preferred ?Office [phone] ? ? ? ? ?[place] ?02/10/2022, 10:44 AM ? ?

## 2022-02-10 NOTE — Evaluation (Signed)
Occupational Therapy Evaluation ?Patient Details ?Name: Brandi Mcmahon ?MRN: [id number] ?DOB: [birth year] ?Today's Date: 02/10/2022 ? ? ?History of Present Illness Brandi Mcmahon is a 81 yo female who presents on 4/17 with RLE ischemia now s/p aortogram with bilateral iliac arteriogram, bilateral common iliac artery angioplasty with stent placement, R lower extermity arteriogram and R common femoral artery endarterectomy. PMHx: anxiety, cancer, HTN, multiple back surgeries 05/2021, 08/2022.  ? ?Clinical Impression ?  ?Pt reported at baseline they are the primary caregiver for their husband but has a nurse who comes once a week and family assisting at this time. Pt at this time was limited due to incisional pain for LB ADLS but was able to complete in sitting and set up. Pt completed mobility to [facility] as pt reported they were not sure if they were going to make it in time to the bathroom with min guard and FW. Pt currently with functional limitations due to the deficits listed below (see OT Problem List).  Pt will benefit from skilled OT to increase their safety and independence with ADL and functional mobility for ADL to facilitate discharge to venue listed below.  ?  ?   ? ?Recommendations for follow up therapy are one component of a multi-disciplinary discharge planning process, led by the attending physician.  Recommendations may be updated based on patient status, additional functional criteria and insurance authorization.  ? ?Follow Up Recommendations ? Home health OT  ?  ?Assistance Recommended at Discharge Intermittent Supervision/Assistance  ?Patient can return home with the following Assistance with cooking/housework;Assist for transportation ? ?  ?Functional Status Assessment ? Patient has had a recent decline in their functional status and demonstrates the ability to make significant improvements in function in a reasonable and predictable amount of time.  ?Equipment Recommendations ?    ?  ?Recommendations for  Other Services   ? ? ?  ?Precautions / Restrictions Precautions ?Precautions: Fall ?Restrictions ?Weight Bearing Restrictions: No  ? ?  ? ?Mobility Bed Mobility ?Overal bed mobility: Modified Independent ?  ?  ?  ?  ?  ?  ?General bed mobility comments: HOB elevated ?  ? ?Transfers ?Overall transfer level: Needs assistance ?Equipment used: Rolling walker (2 wheels) ?Transfers: Sit to/from Stand ?Sit to Stand: Min guard ?  ?  ?  ?  ?  ?General transfer comment: Pt cued about walker placement ?  ? ?  ?Balance Overall balance assessment: Needs assistance ?Sitting-balance support: Feet supported, No upper extremity supported ?Sitting balance-Leahy Scale: Fair ?  ?  ?Standing balance support: During functional activity, Bilateral upper extremity supported ?Standing balance-Leahy Scale: Poor ?Standing balance comment: BUE on RW ?  ?  ?  ?  ?  ?  ?  ?  ?  ?  ?  ?   ? ?ADL either performed or assessed with clinical judgement  ? ?ADL Overall ADL's : Needs assistance/impaired ?Eating/Feeding: Independent;Sitting ?  ?Grooming: Wash/dry hands;Set up;Sitting ?  ?Upper Body Bathing: Set up;Sitting ?  ?Lower Body Bathing: Set up;Sit to/from stand;Cueing for safety;Cueing for sequencing ?  ?Upper Body Dressing : Set up;Sitting ?  ?Lower Body Dressing: Set up;Sit to/from stand ?  ?Toilet Transfer: Min guard;Rolling walker (2 wheels) ?  ?Toileting- Clothing Manipulation and Hygiene: Set up;Cueing for safety;Cueing for sequencing;Sit to/from stand ?  ?  ?  ?Functional mobility during ADLs: Min guard;Rolling walker (2 wheels) ?   ? ? ? ?Vision Baseline Vision/History: 1 Wears glasses ?   ?   ?  Perception   ?  ?Praxis   ?  ? ?Pertinent Vitals/Pain Pain Assessment ?Pain Assessment: 0-10 ?Pain Score: 10-Worst pain ever ?Pain Location: left IV site and right groin ?Pain Descriptors / Indicators: Sore, Discomfort, Operative site guarding ?Pain Intervention(s): Limited activity within patient's tolerance, Monitored during session, RN gave  pain meds during session  ? ? ? ?Hand Dominance Right ?  ?Extremity/Trunk Assessment Upper Extremity Assessment ?Upper Extremity Assessment: Overall WFL for tasks assessed ?  ?Lower Extremity Assessment ?Lower Extremity Assessment: Defer to PT evaluation ?  ?Cervical / Trunk Assessment ?Cervical / Trunk Assessment: Kyphotic ?  ?Communication Communication ?Communication: No difficulties ?  ?Cognition Arousal/Alertness: Awake/alert ?Behavior During Therapy: Restless ?Overall Cognitive Status: Impaired/Different from baseline ?Area of Impairment: Memory, Following commands, Safety/judgement, Attention ?  ?  ?  ?  ?  ?  ?  ?  ?  ?Current Attention Level: Selective ?Memory: Decreased short-term memory ?Following Commands: Follows one step commands with increased time ?Safety/Judgement: Decreased awareness of safety ?  ?  ?General Comments: Pt noted to have fluquation in mood as reporting I am not just going to be "sent out of this hospital this time" and reporting "if the nurse does not get in here right away I am going to scream". Pt noted in session reporting they were not going to work with PT at this time as 10/10 pain but then spoke with PT and agreed to work with them post OT session. ?  ?  ?General Comments  HR up to 120 bpm max with activity. ? ?  ?Exercises   ?  ?Shoulder Instructions    ? ? ?Home Living Family/patient expects to be discharged to:: Private residence ?Living Arrangements: Spouse/significant other;Other relatives ?Available Help at Discharge: Family ?Type of Home: Mobile home ?Home Access: Stairs to enter ?Entrance Stairs-Number of Steps: 4 ?Entrance Stairs-Rails: Right;Left ?Home Layout: One level ?  ?  ?Bathroom Shower/Tub: Walk-in shower ?  ?Bathroom Toilet: Handicapped height ?Bathroom Accessibility: Yes ?  ?Home Equipment: Kasandra Knudsen - single point;Standard Walker ?  ?Additional Comments: 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 She reports she does not have to physically  lift or move him. Her grandson lives there anda ssists as well. ?  ? ?  ?Prior Functioning/Environment Prior Level of Function : Independent/Modified Independent ?  ?  ?  ?  ?  ?  ?Mobility Comments: Reports mod I, but also notes from 4 months ago mention using RW PRN. ?ADLs Comments: Mod I ?  ? ?  ?  ?OT Problem List: Decreased strength;Decreased activity tolerance;Impaired balance (sitting and/or standing);Decreased safety awareness;Decreased knowledge of use of DME or AE;Pain ?  ?   ?OT Treatment/Interventions: Self-care/ADL training;DME and/or AE instruction;Therapeutic activities;Patient/family education;Balance training  ?  ?OT Goals(Current goals can be found in the care plan section) Acute Rehab OT Goals ?Patient Stated Goal: to get moving ?OT Goal Formulation: With patient ?Time For Goal Achievement: 02/24/22 ?Potential to Achieve Goals: Good ?ADL Goals ?Pt Will Perform Grooming: with modified independence;standing ?Pt Will Transfer to Toilet: with modified independence;ambulating ?Additional ADL Goal #1: Pt will be able to complete ADL/IADL simulation with the least restrictive device for 15 mins to be able to return to home  ?OT Frequency: Min 2X/week ?  ? ?Co-evaluation   ?  ?  ?  ?  ? ?  ?AM-PAC OT "6 Clicks" Daily Activity     ?Outcome Measure Help from another person eating meals?: None ?Help from another  person taking care of personal grooming?: A Little ?Help from another person toileting, which includes using toliet, bedpan, or urinal?: A Little ?Help from another person bathing (including washing, rinsing, drying)?: A Little ?Help from another person to put on and taking off regular upper body clothing?: A Little ?Help from another person to put on and taking off regular lower body clothing?: A Little ?6 Click Score: 19 ?  ?End of Session Equipment Utilized During Treatment: Gait belt;Rolling walker (2 wheels) ?Nurse Communication: Mobility status ? ?Activity Tolerance: Patient tolerated treatment  well ?Patient left: in chair;with call bell/phone within reach;with chair alarm set ? ?OT Visit Diagnosis: Unsteadiness on feet (R26.81);Pain ?Pain - Right/Left: Right ?Pain - part of body:  (groin)  ?

## 2022-02-10 NOTE — Plan of Care (Signed)

## 2022-02-10 NOTE — Progress Notes (Signed)
[facility] ? ?Subjective  -states her right foot feels better.  She slept for the first time. ? ? ?Objective ?(!) 117/34 ?98 ?97.6 ?F (36.4 ?C) (Oral) ?12 ?100% ? ?Intake/Output Summary (Last 24 hours) at 02/10/2022 0801 ?Last data filed at 02/10/2022 0700 ?Gross per 24 hour  ?Intake 1544.9 ml  ?Output 1875 ml  ?Net -330.1 ml  ? ? ?Right groin incision clean dry and intact ?Left transfemoral access site clean dry and intact with no hematoma ?Bilateral DP pulses palpable feet warm ? ?Laboratory ?Lab Results: ?Recent Labs  ?  02/09/22 ?1510 02/10/22 ?4709  ?WBC 7.1 9.7  ?HGB 9.6* 9.8*  ?HCT 28.8* 28.4*  ?PLT 213 236  ? ?BMET ?Recent Labs  ?  02/09/22 ?0924 02/09/22 ?1510 02/10/22 ?6283  ?NA 137  --  131*  ?K 4.2  --  4.2  ?CL 104  --  99  ?CO2 24  --  23  ?GLUCOSE 99  --  118*  ?BUN 14  --  9  ?CREATININE 0.92 0.77 0.80  ?CALCIUM 9.2  --  8.1*  ? ? ?COAG ?Lab Results  ?Component Value Date  ? INR 1.1 02/09/2022  ? ?No results found for: PTT ? ?Assessment/Planning: ? ?Postop day 1 status post right common femoral endarterectomy with profundoplasty including SFA endarterectomy with bovine patch and bilateral common iliac artery stenting from retrograde approach for critical limb ischemia with rest pain in the right foot.  Her right groin incision looks great.  Left transfemoral access site looks good as well.  Palpable DP pulses bilaterally.  Symptoms resolved.  Plan aspirin, statin, Plavix.  She is requesting Colace, gabapentin 100 mg 3 times daily, Tylenol and have placed all those orders.  We will have her work with therapy today.  Looks great. ? ?[name] ?02/10/2022 ?8:01 AM ?-- ? ? ?

## 2022-02-10 NOTE — Progress Notes (Signed)
PHARMACIST LIPID MONITORING ? ? ?Brandi Mcmahon is a 81 y.o. female admitted on 02/09/2022 s/p femoral endarterectomy.  Pharmacy has been consulted to optimize lipid-lowering therapy with the indication of secondary prevention for clinical ASCVD. ? ?Recent Labs: ? ?Lipid Panel (last 6 months):   ?Lab Results  ?Component Value Date  ? CHOL 159 02/10/2022  ? TRIG 123 02/10/2022  ? HDL 40 (L) 02/10/2022  ? CHOLHDL 4.0 02/10/2022  ? VLDL 25 02/10/2022  ? [place] 94 02/10/2022  ? ? ?Hepatic function panel (last 6 months):   ?Lab Results  ?Component Value Date  ? AST 18 02/09/2022  ? ALT 12 02/09/2022  ? ALKPHOS 90 02/09/2022  ? BILITOT 0.3 02/09/2022  ? ? ?SCr (since admission):   ?Serum creatinine: 0.8 mg/dL 02/10/22 0528 ?Estimated creatinine clearance: 44.4 mL/min ? ?Current therapy and lipid therapy tolerance ?Current lipid-lowering therapy: simvastatin '20mg'$  daily as per PTA  ?Documented or reported allergies or intolerances to lipid-lowering therapies (if applicable): none documented ? ?Assessment:   ?LDL 94 > goal of < 70  ? ?Plan:   ? ?1.Statin intensity (high intensity recommended for all patients regardless of the LDL):  Add or increase statin to high intensity.  ?Will change simvastatin to atorvastatin '80mg'$  daily ? ? ?Bonnita Nasuti Pharm.D. CPP, BCPS ?Clinical Pharmacist ?[phone] ?02/10/2022 9:50 AM  ? ? ?

## 2022-02-11 NOTE — Progress Notes (Signed)
Mobility Specialist Progress Note ? ? 02/11/22 1400  ?Mobility  ?Activity Ambulated independently in hallway  ?Level of Assistance Modified independent, requires aide device or extra time  ?Assistive Device Front wheel walker  ?Distance Ambulated (ft) 370 ft  ?Activity Response Tolerated well  ?$Mobility charge 1 Mobility  ? ?Post Mobility: 91 HR, 102/79 BP, 95% SpO2 ? ?Received in bed c/o R groin discomfort but groin sites stable and pt agreeable. Ambulated w/o fault, returned back to bed w/ call bell by side and VSS. ? ?Holland Falling ?Mobility Specialist ?Phone Number [phone] ? ?

## 2022-02-11 NOTE — TOC Initial Note (Signed)
Transition of Care (TOC) - Initial/Assessment Note  ?Marvetta Gibbons Therapist, sports, BSN ?Transitions of Care ?Unit 4E- RN Case Manager ?See Treatment Team for direct phone #  ? ? ?Patient Details  ?Name: Brandi Mcmahon ?MRN: [id number] ?Date of Birth: 1941-01-18 ? ?Transition of Care (TOC) CM/SW Contact:    ?Dahlia Client, Romeo Rabon, RN ?Phone Number: ?02/11/2022, 11:23 AM ? ?Clinical Narrative:                 ?Pt from home w/ spouse who has dementia, pt s/p right femoral endarterectomy. CM spoke with pt at bedside regarding transition needs. Per pt she is active with [facility] [place] and would like to continue services with them. Pt reports she has all needed DME.  ?List provided Per CMS guidelines from medicare.gov website with star ratings (copy placed in shadow chart)  ?Pt will need HH orders for PT (please write just for PT- HH will add OT as staffing allows) ? ?Pt shares that her children and grandchildren help assist her with her spouse- whom she is the primary caregiver for. She is tearful when speaking about him and how his dementia has progressed quickly. Pt provided space to openly share her thoughts and feelings on the stress of being a caregiver and having her own health issues as well. She does have a good supportive family and assistance. ? ?Address, phone # and PCP all confirmed with patient, she also reports she will have transport home.  ? ?Call made to [facility] with [facility] to confirm services- per Tommi Rumps pt is not currently active for [facility] services, per [facility] office they can accept referral again- however only have staffing for HHPT at this time- they can add [place] in about a week as staffing allows- referral accepted for PT only at this time.  ? ?Expected Discharge Plan: [place] ?Barriers to Discharge: No Barriers Identified ? ? ?Patient Goals and CMS Choice ?Patient states their goals for this hospitalization and ongoing recovery are:: return home ?CMS Medicare.gov Compare Post Acute Care list provided  to:: Patient ?Choice offered to / list presented to : Patient ? ?Expected Discharge Plan and Services ?Expected Discharge Plan: [place] ?  ?Discharge Planning Services: CM Consult ?Post Acute Care Choice: Home Health, Resumption of Svcs/PTA Provider ?Living arrangements for the past 2 months: [place] ?                ?DME Arranged: N/A ?DME Agency: NA ?  ?  ?  ?HH Arranged: PT, OT ?[place] Agency: [place] ?Date HH Agency Contacted: 02/11/22 ?Time [place]: [id number] ?Representative spoke with at [place]: Tommi Rumps ? ?Prior Living Arrangements/Services ?Living arrangements for the past 2 months: [place] ?Lives with:: Spouse, Adult Children ?Patient language and need for interpreter reviewed:: Yes ?Do you feel safe going back to the place where you live?: Yes      ?Need for Family Participation in Patient Care: Yes (Comment) ?Care giver support system in place?: Yes (comment) ?Current home services: DME (has walker, and other needed DME) ?Criminal Activity/Legal Involvement Pertinent to Current Situation/Hospitalization: No - Comment as needed ? ?Activities of Daily Living ?Home Assistive Devices/Equipment: Eyeglasses, Dentures (specify type), Cane (specify quad or straight), Walker (specify type) ?ADL Screening (condition at time of admission) ?Patient's cognitive ability adequate to safely complete daily activities?: Yes ?Is the patient deaf or have difficulty hearing?: No ?Does the patient have difficulty seeing, even when wearing glasses/contacts?:  No ?Does the patient have difficulty concentrating, remembering, or making decisions?: No ?Patient able to express need for assistance with ADLs?: Yes ?Does the patient have difficulty dressing or bathing?: No ?Independently performs ADLs?: Yes (appropriate for developmental age) ?Does the patient have difficulty walking or climbing stairs?: Yes ?Weakness of Legs: Right ?Weakness of Arms/Hands: None ? ?Permission  Sought/Granted ?Permission sought to share information with : Customer service manager ?Permission granted to share information with : Yes, Verbal Permission Granted ?   ? Permission granted to share info w AGENCY: [facility] ?   ?   ? ?Emotional Assessment ?Appearance:: Appears stated age ?Attitude/Demeanor/Rapport: Engaged, Crying ?Affect (typically observed): Appropriate, Tearful/Crying ?Orientation: : Oriented to Self, Oriented to Place, Oriented to  Time, Oriented to Situation ?Alcohol / Substance Use: Not Applicable ?Psych Involvement: No (comment) ? ?Admission diagnosis:  Peripheral arterial disease ([place]) [I73.9] ?Patient Active Problem List  ? Diagnosis Date Noted  ? Peripheral arterial disease ([place]) 02/09/2022  ? Critical limb ischemia of right lower extremity ([place]) 02/03/2022  ? Spinal stenosis at L4-L5 level 09/15/2021  ? 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 ?PCP:  Elyn Aquas ?Pharmacy:   ?[id number] [id number]?Phone: [phone]: [phone]? ? ? ? ?Social Determinants of Health (SDOH) Interventions ?  ? ?Readmission Risk Interventions ? ?  02/11/2022  ? 11:23 AM  ?Readmission Risk Prevention Plan  ?Post Dischage Appt Complete  ?Medication Screening Complete  ?Transportation Screening Complete  ? ? ? ?

## 2022-02-11 NOTE — Progress Notes (Signed)
[facility] ? ?Subjective  -up walking with therapy this morning. ? ? ?Objective ?129/76 ?97 ?99.1 ?F (37.3 ?C) (Oral) ?20 ?90% ? ?Intake/Output Summary (Last 24 hours) at 02/11/2022 0846 ?Last data filed at 02/11/2022 0717 ?Gross per 24 hour  ?Intake 1060 ml  ?Output 1850 ml  ?Net -790 ml  ? ?Right groin incision clean dry and intact with no hematoma ?Left transfemoral access site no hematoma ?Bilateral DP pulses palpable and feet are warm ? ?Laboratory ?Lab Results: ?Recent Labs  ?  02/09/22 ?1510 02/10/22 ?5638  ?WBC 7.1 9.7  ?HGB 9.6* 9.8*  ?HCT 28.8* 28.4*  ?PLT 213 236  ? ?BMET ?Recent Labs  ?  02/09/22 ?0924 02/09/22 ?1510 02/10/22 ?7564  ?NA 137  --  131*  ?K 4.2  --  4.2  ?CL 104  --  99  ?CO2 24  --  23  ?GLUCOSE 99  --  118*  ?BUN 14  --  9  ?CREATININE 0.92 0.77 0.80  ?CALCIUM 9.2  --  8.1*  ? ? ?COAG ?Lab Results  ?Component Value Date  ? INR 1.1 02/09/2022  ? ?No results found for: PTT ? ?Assessment/Planning: ? ?Postop day 2 status post right common femoral endarterectomy with profundoplasty including SFA endarterectomy with bovine patch and bilateral common iliac artery stenting from retrograde approach for critical limb ischemia with rest pain in the right foot.  Her right groin incision continues to look good.  Left transfemoral access site looks good as well.  Palpable DP pulses bilaterally.  Continue aspirin, statin, Plavix.  Therapy recommending home health PT.  She feels she needs one more day.  Overall looks good from my standpoint.  We will plan discharge tomorrow as discussed with the patient. ? ?[name] ?02/11/2022 ?8:46 AM ?-- ? ? ?

## 2022-02-11 NOTE — Plan of Care (Signed)
?  Problem: Education: ?Goal: Understanding of CV disease, CV risk reduction, and recovery process will improve ?Outcome: Progressing ?Goal: Individualized Educational Video(s) ?Outcome: Progressing ?  ?Problem: Activity: ?Goal: Ability to return to baseline activity level will improve ?Outcome: Progressing ?  ?Problem: Cardiovascular: ?Goal: Ability to achieve and maintain adequate cardiovascular perfusion will improve ?Outcome: Progressing ?Goal: Vascular access site(s) Level 0-1 will be maintained ?Outcome: Progressing ?  ?Problem: Health Behavior/Discharge Planning: ?Goal: Ability to safely manage health-related needs after discharge will improve ?Outcome: Progressing ?  ?Problem: Education: ?Goal: Knowledge of General Education information will improve ?Description: Including pain rating scale, medication(s)/side effects and non-pharmacologic comfort measures ?Outcome: Progressing ?  ?Problem: Nutrition: ?Goal: Adequate nutrition will be maintained ?Outcome: Progressing ?  ?Problem: Elimination: ?Goal: Will not experience complications related to bowel motility ?Outcome: Progressing ?Goal: Will not experience complications related to urinary retention ?Outcome: Progressing ?  ?Problem: Pain Managment: ?Goal: General experience of comfort will improve ?Outcome: Progressing ?  ?Problem: Safety: ?Goal: Ability to remain free from injury will improve ?Outcome: Progressing ?  ?Problem: Skin Integrity: ?Goal: Risk for impaired skin integrity will decrease ?Outcome: Progressing ?  ?

## 2022-02-11 NOTE — Progress Notes (Signed)
Occupational Therapy Treatment ?Patient Details ?Name: Brandi Mcmahon ?MRN: [id number] ?DOB: November 22, 1940 ?Today's Date: 02/11/2022 ? ? ?History of present illness Brandi Mcmahon is a 81 yo female who presents on 4/17 with RLE ischemia now s/p aortogram with bilateral iliac arteriogram, bilateral common iliac artery angioplasty with stent placement, R lower extermity arteriogram and R common femoral artery endarterectomy. PMHx: anxiety, cancer, HTN, multiple back surgeries 05/2021, 08/2022. ?  ?OT comments ? Pt making steady progress towards OT goals this session. Pt continues to present with increased pain in R foot and R groin incision . Session focus on functional mobility to increase overall activity tolerance for higher level BADLS and BADL reeducation. Pt currently requires min guard assist for functional ambulation with Rw greater than a  household distance, and supervision for 3/3 toileting tasks. Pt completed UB and LB ADLs with set- up assist. Pt insistent on wanting to DC home tomorrow, pt can be verbose at times but overall WFL. Pt would continue to benefit from skilled occupational therapy while admitted and after d/c to address the below listed limitations in order to improve overall functional mobility and facilitate independence with BADL participation. DC plan remains appropriate, will follow acutely per POC.  ? ?  ? ?Recommendations for follow up therapy are one component of a multi-disciplinary discharge planning process, led by the attending physician.  Recommendations may be updated based on patient status, additional functional criteria and insurance authorization. ?   ?Follow Up Recommendations ? Home health OT  ?  ?Assistance Recommended at Discharge Intermittent Supervision/Assistance  ?Patient can return home with the following ? Assistance with cooking/housework;Assist for transportation ?  ?Equipment Recommendations ?    ?  ?Recommendations for Other Services   ? ?  ?Precautions / Restrictions  Precautions ?Precautions: Fall ?Restrictions ?Weight Bearing Restrictions: No  ? ? ?  ? ?Mobility Bed Mobility ?Overal bed mobility: Modified Independent ?  ?  ?  ?  ?  ?  ?General bed mobility comments: HOB elevated ?  ? ?Transfers ?Overall transfer level: Needs assistance ?Equipment used: Rolling walker (2 wheels) ?Transfers: Sit to/from Stand ?Sit to Stand: Supervision ?  ?  ?  ?  ?  ?  ?  ?  ?Balance   ?Sitting-balance support: Feet supported, No upper extremity supported ?Sitting balance-Leahy Scale: Fair ?Sitting balance - Comments: sitting EOB to don shoes ?  ?Standing balance support: No upper extremity supported, During functional activity ?Standing balance-Leahy Scale: Fair ?Standing balance comment: standing at sink for grooming tasks ?  ?  ?  ?  ?  ?  ?  ?  ?  ?  ?  ?   ? ?ADL either performed or assessed with clinical judgement  ? ?ADL Overall ADL's : Needs assistance/impaired ?  ?  ?Grooming: Wash/dry hands;Standing;Supervision/safety ?  ?  ?  ?Lower Body Bathing: Supervison/ safety;Sit to/from stand ?Lower Body Bathing Details (indicate cue type and reason): simulated via pericare ?Upper Body Dressing : Set up;Sitting ?Upper Body Dressing Details (indicate cue type and reason): to don posterior gown ?Lower Body Dressing: Set up;Sitting/lateral leans ?Lower Body Dressing Details (indicate cue type and reason): to don shoes from EOB ?Toilet Transfer: Min guard;Rolling walker (2 wheels);Regular Toilet;Standard walker;Ambulation;BSC/3in1 (BSc over toilet; lower BSC per pt request) ?  ?Toileting- Clothing Manipulation and Hygiene: Supervision/safety;Sit to/from stand ?  ?  ?  ?Functional mobility during ADLs: Min guard;Rolling walker (2 wheels) ?General ADL Comments: pt completed standing grooming tasks, functionalmobility and toileting with Min  guard - supervision. ?  ? ?Extremity/Trunk Assessment Upper Extremity Assessment ?Upper Extremity Assessment: Overall WFL for tasks assessed ?  ?Lower Extremity  Assessment ?Lower Extremity Assessment: Defer to PT evaluation ?  ?  ?  ? ?Vision Baseline Vision/History: 1 Wears glasses ?Patient Visual Report: No change from baseline ?  ?  ?Perception Perception ?Perception: Within Functional Limits ?  ?Praxis Praxis ?Praxis: Intact ?  ? ?Cognition Arousal/Alertness: Awake/alert ?Behavior During Therapy: [facility] for tasks assessed/performed (hyperverbose) ?Overall Cognitive Status: Impaired/Different from baseline ?Area of Impairment: Attention, Safety/judgement ?  ?  ?  ?  ?  ?  ?  ?  ?  ?Current Attention Level: Selective ?  ?  ?Safety/Judgement: Decreased awareness of safety ?  ?  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  ?  ?   ?Exercises   ? ?  ?Shoulder Instructions   ? ? ?  ?General Comments HR max 116 bpm RR up to 30s with mobility, pt concered about going home as pt caregiver for husband, encouraged pt to reiterate concerns to CM to see if pt is eligible for extra help. pt wants [facility], let CM know  ? ? ?Pertinent Vitals/ Pain       Pain Assessment ?Pain Assessment: Faces ?Faces Pain Scale: Hurts little more ?Pain Location: R foot and R incision site ?Pain Descriptors / Indicators: Sore, Discomfort, Operative site guarding ?Pain Intervention(s): Monitored during session ? ?Home Living   ?  ?  ?  ?  ?  ?  ?  ?  ?  ?  ?  ?  ?  ?  ?  ?  ?  ?  ? ?  ?Prior Functioning/Environment    ?  ?  ?  ?   ? ?Frequency ? Min 2X/week  ? ? ? ? ?  ?Progress Toward Goals ? ?OT Goals(current goals can now be found in the care plan section) ? Progress towards OT goals: Progressing toward goals ? ?Acute Rehab OT Goals ?Patient Stated Goal: to go home tomorrow ?OT Goal Formulation: With patient ?Time For Goal Achievement: 02/24/22 ?Potential to Achieve Goals: Good  ?Plan Discharge plan remains appropriate;Frequency  remains appropriate   ? ?Co-evaluation ? ? ?   ?  ?  ?  ?  ? ?  ?AM-PAC OT "6 Clicks" Daily Activity     ?Outcome Measure ? ? Help from another person eating meals?: None ?Help from another person taking care of personal grooming?: None ?Help from another person toileting, which includes using toliet, bedpan, or urinal?: A Little ?Help from another person bathing (including washing, rinsing, drying)?: A Little ?Help from another person to put on and taking off regular upper body clothing?: None ?Help from another person to put on and taking off regular lower body clothing?: A Little ?6 Click Score: 21 ? ?  ?End of Session Equipment Utilized During Treatment: Gait belt;Rolling walker (2 wheels) ? ?OT Visit Diagnosis: Unsteadiness on feet (R26.81);Pain ?Pain - Right/Left: Right ?  ?Activity Tolerance Patient tolerated treatment well ?  ?Patient Left in bed;with call bell/phone within reach;with bed alarm set ?  ?Nurse Communication Mobility status ?  ? ?   ? ?Time: 2992-4268 ?OT Time Calculation (min): 27 min ? ?Charges: OT General Charges ?$OT Visit: 1 Visit ?OT Treatments ?$Self Care/Home Management :  23-37 mins ? ?Corinne Ports K., COTA/L ?Acute Rehabilitation Services ?[phone] ? ? ?Precious Haws ?02/11/2022, 9:24 AM ?

## 2022-02-11 NOTE — Progress Notes (Signed)
Physical Therapy Treatment ?Patient Details ?Name: Brandi Mcmahon ?MRN: [id number] ?DOB: 07/25/1941 ?Today's Date: 02/11/2022 ? ? ?History of Present Illness Brandi Mcmahon is a 81 yo female who presents on 4/17 with RLE ischemia now s/p aortogram with bilateral iliac arteriogram, bilateral common iliac artery angioplasty with stent placement, R lower extermity arteriogram and R common femoral artery endarterectomy. PMHx: anxiety, cancer, HTN, multiple back surgeries 05/2021, 08/2022. ? ?  ?PT Comments  ? ? Pt received in supine, deferring EOB/OOB mobility despite encouragement due to increased R groin pain and pt wanting to finish lunch. Pt instructed on benefits of increased/continued mobility, use of cryotherapy for pain/inflammation reduction, stair sequencing and fall risk prevention. HEP given, will plan to review in more detail next session if pt agreeable. Plan to assess gait/stairs next session with focus on energy conservation/strategies to reduce pain as this seems to be pt's highest barrier to mobility progression. Pt continues to benefit from PT services to progress toward functional mobility goals.    ?Recommendations for follow up therapy are one component of a multi-disciplinary discharge planning process, led by the attending physician.  Recommendations may be updated based on patient status, additional functional criteria and insurance authorization. ? ?Follow Up Recommendations ? Home health PT ?  ?  ?Assistance Recommended at Discharge Intermittent Supervision/Assistance  ?Patient can return home with the following Assist for transportation;Assistance with cooking/housework;Help with stairs or ramp for entrance;A little help with bathing/dressing/bathroom ?  ?Equipment Recommendations ? Rolling walker (2 wheels)  ?  ?Recommendations for Other Services   ? ? ?  ?Precautions / Restrictions Precautions ?Precautions: Fall ?Restrictions ?Weight Bearing Restrictions: No  ?  ? ?Mobility ? Bed  Mobility ?Overal bed mobility: Modified Independent ?  ?  ?  ?  ?  ?  ?General bed mobility comments: pt received/remained in supine, deferring EOB/OOB mobility ?  ? ? ?Stairs ?Stairs:  (pt able to verbalize proper sequence for ascending/descending but refusing to perform at this time due to eating lunch and increased groin pain) ?  ?  ?  ?  ? ? ? ?  ?   ?Cognition Arousal/Alertness: Awake/alert ?Behavior During Therapy: [facility] for tasks assessed/performed, Flat affect ?Overall Cognitive Status: No family/caregiver present to determine baseline cognitive functioning ?  ?  ?  ?  ?Current Attention Level: Selective ?  ?  ?Safety/Judgement: Decreased awareness of safety ?  ?  ?General Comments: Pt complaining about bed alarm being on previously and instructed on fall risk precautions, may need reinforcement. ?  ?  ? ?  ?Exercises Other Exercises ?Other Exercises: Discussion on supine/seated LE exercises, HEP handout given plan to review in more detail next session. ? ?  ?General Comments General comments (skin integrity, edema, etc.): VSS on RA at resting; pt refusing OOB mobility, discussion on benefits of mobility and pressure relief/pain control strategies. ?  ?  ? ?Pertinent Vitals/Pain Pain Assessment ?Pain Assessment: Faces ?Faces Pain Scale: Hurts even more ?Pain Location: R foot and R groin incision site ?Pain Descriptors / Indicators: Sore, Discomfort, Operative site guarding ?Pain Intervention(s): Limited activity within patient's tolerance, Ice applied  ? ? ? ?PT Goals (current goals can now be found in the care plan section) Acute Rehab PT Goals ?Patient Stated Goal: less pain, "not to go home too soon." ?PT Goal Formulation: With patient ?Time For Goal Achievement: 02/24/22 ?Progress towards PT goals: Progressing toward goals ? ?  ?Frequency ? ? ? Min 3X/week ? ? ? ?  ?PT Plan  Current plan remains appropriate  ? ? ?   ?AM-PAC PT "6 Clicks" Mobility   ?Outcome Measure ? Help needed turning from your back to  your side while in a flat bed without using bedrails?: A Little ?Help needed moving from lying on your back to sitting on the side of a flat bed without using bedrails?: A Little ?Help needed moving to and from a bed to a chair (including a wheelchair)?: A Little ?Help needed standing up from a chair using your arms (e.g., wheelchair or bedside chair)?: A Little ?Help needed to walk in hospital room?: A Little ?Help needed climbing 3-5 steps with a railing? : A Little ?6 Click Score: 18 ? ?  ?End of Session   ?Activity Tolerance: Patient limited by fatigue;Patient limited by pain ?Patient left: in bed;with call bell/phone within reach ?Nurse Communication: Mobility status ?PT Visit Diagnosis: Pain;Other abnormalities of gait and mobility (R26.89) ?Pain - Right/Left: Right ?  ? ? ?Time: 1152-1202 ?PT Time Calculation (min) (ACUTE ONLY): 10 min ? ?Charges:  $Therapeutic Activity: 8-22 mins          ?          ? ?Brandi Lopez P., Brandi Mcmahon ?Acute Rehabilitation Services ?Secure Chat Preferred 9a-5:30pm ?Office: [phone]  ? ? ?Lelia Jons M Hutson Luft ?02/11/2022, 2:43 PM ? ?

## 2022-02-12 MED ORDER — ASPIRIN 81 MG PO TBEC: 81.0000 mg | DELAYED_RELEASE_TABLET | Freq: Every day | ORAL | 11 refills | Status: DC

## 2022-02-12 MED ORDER — ATORVASTATIN CALCIUM 80 MG PO TABS: 80.0000 mg | ORAL_TABLET | Freq: Every day | ORAL | 11 refills | Status: DC

## 2022-02-12 NOTE — Discharge Instructions (Addendum)
?[facility] [place] ? ?Discharge Instructions ? ?Lower Extremity Angiogram; Angioplasty/Stenting ? ?Please refer to the following instructions for your post-procedure care. Your surgeon or physician assistant will discuss any changes with you. ? ?Activity ? ?You are encouraged to walk as much as you can. You can slowly return to normal activities during the month after your surgery. Avoid strenuous activity and heavy lifting until your doctor tells you it's OK. Avoid activities such as vacuuming or swinging a golf club. Do not drive until your doctor give the OK and you are no longer taking prescription pain medications. It is also normal to have difficulty with sleep habits, eating and bowel movement after surgery. These will go away with time. ? ?Bathing/Showering ? ?Shower daily after you go home. Do not soak in a bathtub, hot tub, or swim until the incision heals completely. ? ?Incision Care ? ?Clean your incision with mild soap and water. Shower every day. Pat the area dry with a clean towel. You do not need a bandage unless otherwise instructed. Do not apply any ointments or creams to your incision. If you have open wounds you will be instructed how to care for them or a visiting nurse may be arranged for you. If you have staples or sutures along your incision they will be removed at your post-op appointment. You may have skin glue on your incision. Do not peel it off. It will come off on its own in about one week. ? ?Wash the groin wound with soap and water daily and pat dry. (No tub bath-only shower)  Then put a dry gauze or washcloth in the groin to keep this area dry to help prevent wound infection.  Do this daily and as needed.  Do not use Vaseline or neosporin on your incisions.  Only use soap and water on your incisions and then protect and keep dry. ? ?Diet ? ?Resume your normal diet. There are no special food restrictions following this procedure. A low fat/ low cholesterol  diet is recommended for all patients with vascular disease. In order to heal from your surgery, it is CRITICAL to get adequate nutrition. Your body requires vitamins, minerals, and protein. Vegetables are the best source of vitamins and minerals. Vegetables also provide the perfect balance of protein. Processed food has little nutritional value, so try to avoid this. ? ?Medications ? ?Resume taking all your medications unless your doctor or physician assistant tells you not to. If your incision is causing pain, you may take over-the-counter pain relievers such as acetaminophen (Tylenol). If you were prescribed a stronger pain medication, please aware these medication can cause nausea and constipation. Prevent nausea by taking the medication with a snack or meal. Avoid constipation by drinking plenty of fluids and eating foods with high amount of fiber, such as fruits, vegetables, and grains. Take Colace 100 mg (an over-the-counter stool softener) twice a day as needed for constipation.  ? ?Do not take Tylenol if you are taking prescription pain medications. ? ?Follow Up ? ?Our office will schedule a follow up appointment 2-3 weeks following discharge. ? ?Please call us immediately for any of the following conditions ? ?Severe or worsening pain in your legs or feet while at rest or while walking Increase pain, redness, warmth, or drainage (pus) from your incision site(s) ?Fever of 101 degree or higher ?The swelling in your leg with the bypass suddenly worsens and becomes more painful than when you were in the hospital ?If you have  been instructed to feel your graft pulse then you should do so every day. If you can no longer feel this pulse, call the office immediately. Not all patients are given this instruction. ? ?Leg swelling is common after leg bypass surgery. ? ?The swelling should improve over a few months following surgery. To improve the swelling, you may elevate your legs above the level of your heart while  you are sitting or resting. Your surgeon or physician assistant may ask you to apply an ACE wrap or wear compression (TED) stockings to help to reduce swelling. ? ?Please call us immediately for any of the following conditions: ?Severe or worsening pain your legs or feet at rest or with walking. ?Increased pain, redness, drainage at your groin puncture site. ?Fever of 101 degrees or higher. ?If you have any mild or slow bleeding from your puncture site: lie down, apply firm constant pressure over the area with a piece of gauze or a clean wash cloth for 30 minutes- no peeking!, call 911 right away if you are still bleeding after 30 minutes, or if the bleeding is heavy and unmanageable. ? ?Reduce your risk factors of vascular disease: ? ?Stop smoking. If you would like help call [facility] at 1-800-QUIT-NOW 956-133-7004) or [place] at [phone]. ?Manage your cholesterol ?Maintain a desired weight ?Control your diabetes ?Keep your blood pressure down ? ?If you have any questions, please call the office at [phone] ? ? ? ?

## 2022-02-12 NOTE — Progress Notes (Signed)
PT Cancellation Note ? ?Patient Details ?Name: Brandi Mcmahon ?MRN: [id number] ?DOB: October 18, 1941 ? ? ?Cancelled Treatment:    Reason Eval/Treat Not Completed: (P)  (pt reports no concerns, refusing OOB wanting to eat breakfast. Pt reports she can get dressed unassisted and doesn't want to get up at this time. Pt refused OOB PT sessions Wed/Thurs but has worked with mobility specialist.) Will continue efforts per PT plan of care as schedule permits. ? ? ?Kara Pacer Loran Auguste ?02/12/2022, 10:08 AM ? ? ?

## 2022-02-12 NOTE — Progress Notes (Addendum)
?  Progress Note ? ? ? ?02/12/2022 ?7:54 AM ?3 Days Post-Op ? ?Subjective:  no major complaints ? ? ?Vitals:  ? 02/12/22 0455 02/12/22 0710  ?BP: (!) 105/56 112/61  ?Pulse: 80 76  ?Resp: 17 20  ?Temp: 97.6 ?F (36.4 ?C) 98.3 ?F (36.8 ?C)  ?SpO2: 93% 95%  ? ?Physical Exam: ?Cardiac:  regular ?Lungs:  non labored ?Incisions:  Right groin incision is c/d/I without swelling or hematoma ?Extremities:  well perfused and warm. Palpable pedal pulses bilaterally ?Abdomen:  flat, soft, non distended ?Neurologic: alert and oriented ? ?CBC ?   ?Component Value Date/Time  ? WBC 9.7 02/10/2022 0528  ? RBC 3.19 (L) 02/10/2022 0528  ? HGB 9.8 (L) 02/10/2022 0528  ? HCT 28.4 (L) 02/10/2022 0528  ? PLT 236 02/10/2022 0528  ? MCV 89.0 02/10/2022 0528  ? MCH 30.7 02/10/2022 0528  ? MCHC 34.5 02/10/2022 0528  ? RDW 14.5 02/10/2022 0528  ? ? ?BMET ?   ?Component Value Date/Time  ? NA 131 (L) 02/10/2022 0528  ? K 4.2 02/10/2022 0528  ? CL 99 02/10/2022 0528  ? CO2 23 02/10/2022 0528  ? GLUCOSE 118 (H) 02/10/2022 0528  ? BUN 9 02/10/2022 0528  ? BUN 13 12/29/2021 1145  ? CREATININE 0.80 02/10/2022 0528  ? CALCIUM 8.1 (L) 02/10/2022 0528  ? GFRNONAA >60 02/10/2022 0528  ? ? ?INR ?   ?Component Value Date/Time  ? INR 1.1 02/09/2022 0924  ? ? ? ?Intake/Output Summary (Last 24 hours) at 02/12/2022 0754 ?Last data filed at 02/11/2022 1928 ?Gross per 24 hour  ?Intake 360 ml  ?Output --  ?Net 360 ml  ? ? ? ?Assessment/Plan:  81 y.o. female is s/p right Common femoral endarterectomy with profundoplasty and bilateral common iliac artery stenting 3 Days Post-Op  ? ?RLE pain resolved ?BLE well perfused and warm with palpable pedal pulses ?PT/ OT recommend HH PT. Orders placed. Already has [place] arranged ?Aspirin, Statin, Plavix ?Stable for discharge home today ?She will have follow up arranged in 2-3 weeks for incision check ? ? ?Karoline Caldwell, PA-C ?[facility] ?[phone] ?02/12/2022 ?7:54 AM ? ?I have seen and evaluated the  patient. I agree with the PA note as documented above. Postop day 3 status post right common femoral endarterectomy with profundoplasty including SFA endarterectomy with bovine patch and bilateral common iliac artery stenting from retrograde approach for critical limb ischemia with rest pain in the right foot.  Her right groin incision continues to look good.  Left transfemoral access site looks good as well.  Palpable DP pulses bilaterally.  Continue aspirin, statin, Plavix.  Plan discharge today.  Follow-up in 2-3 weeks for incision check.  Dicussed she call with questions or concerns. ?  ? ?[name], MD ?[facility] ?Office: [phone] ? ?

## 2022-02-12 NOTE — Progress Notes (Signed)
D/c instructions given to pt. Wound care and medications reviewed, all questions answered. Family to escort pt home. ? ?Clyde Canterbury, RN ? ?

## 2022-02-12 NOTE — TOC Transition Note (Signed)
Transition of Care (TOC) - CM/SW Discharge Note ?Marvetta Gibbons Therapist, sports, BSN ?Transitions of Care ?Unit 4E- RN Case Manager ?See Treatment Team for direct phone #  ? ? ?Patient Details  ?Name: Brandi Mcmahon ?MRN: [id number] ?Date of Birth: 1941/06/28 ? ?Transition of Care (TOC) CM/SW Contact:  ?Dahlia Client, Romeo Rabon, RN ?Phone Number: ?02/12/2022, 10:32 AM ? ? ?Clinical Narrative:    ?Pt stable for transition home today, HHPT order has been placed, [place] set up with [facility]. Cory with [facility] notified of discharge for start of care.  ?Pt has called her ride for transport home.  ? ?No further TOC needs noted.  ? ? ?Final next level of care: [place] ?Barriers to Discharge: No Barriers Identified ? ? ?Patient Goals and CMS Choice ?Patient states their goals for this hospitalization and ongoing recovery are:: return home ?CMS Medicare.gov Compare Post Acute Care list provided to:: Patient ?Choice offered to / list presented to : Patient ? ?Discharge Placement ?  ?           ?  ? Home w/ HH ?  ?  ? ?Discharge Plan and Services ?  ?Discharge Planning Services: CM Consult ?Post Acute Care Choice: Home Health, Resumption of Svcs/PTA Provider          ?DME Arranged: N/A ?DME Agency: NA ?  ?  ?  ?HH Arranged: PT, OT ?[place] Agency: [facility] ?Date HH Agency Contacted: 02/11/22 ?Time [place]: [id number] ?Representative spoke with at [place]: Tommi Rumps ? ?Social Determinants of Health (SDOH) Interventions ?  ? ? ?Readmission Risk Interventions ? ?  02/11/2022  ? 11:23 AM  ?Readmission Risk Prevention Plan  ?Post Dischage Appt Complete  ?Medication Screening Complete  ?Transportation Screening Complete  ? ? ? ? ? ?

## 2022-02-12 NOTE — Care Management Important Message (Signed)
Important Message ? ?Patient Details  ?Name: Brandi Mcmahon ?MRN: [id number] ?Date of Birth: 1940-12-21 ? ? ?Medicare Important Message Given:  Yes ? ? ? ? ?[name] ?02/12/2022, 9:04 AM ?

## 2022-02-12 NOTE — Progress Notes (Signed)
The patient has requested for her prescription medicines be sent to a new pharmacy as her previous one will not be able to get her medicines. The new pharmacy is : [facility], [address] [place] [address]. ?

## 2022-02-13 ENCOUNTER — Telehealth: Payer: Self-pay

## 2022-02-13 NOTE — Discharge Summary (Signed)
?Bypass Discharge Summary ?Patient ID: ?Brandi Mcmahon ?[id number] ?81 y.o. ?1941/02/07 ? ?Admit date: 02/09/2022 ? ?Discharge date and time: 02/12/2022 10:37 AM  ? ?Admitting Physician: [name], MD  ? ?Discharge Physician: [name], MD ? ?Admission Diagnoses: Peripheral arterial disease ([place]) [I73.9] ? ?Discharge Diagnoses: Peripheral artery disease ? ?Admission Condition: fair ? ?Discharged Condition: good ? ?Indication for Admission:  Patient is an 81 year old female who has had chronic pain in the right lower extremity and found to have significant arterial insufficiency with an ABI of 0.15 consistent with rest pain.  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  She presents today for likely hybrid approach with right common femoral endarterectomy and retrograde iliac stenting versus extra-anatomic bypass.  Risks benefits discussed.  An assistant was needed to expedite the case and to help sew the patch and for wire exchange. ?  ? ?Hospital Course: Brandi Mcmahon was admitted on 4/17 and underwent  ?1.  Right common femoral artery endarterectomy including profundoplasty and endarterectomy of the proximal SFA with bovine pericardial patch angioplasty ?2.  Ultrasound-guided access left common femoral artery ?3.  Aortogram with bilateral iliac arteriogram ?4.  Bilateral common iliac artery angioplasty with stent placement with kissing stents (8 mm x 59 mm VBX right common iliac artery and 8 mm x 29 mm VBX left common iliac artery ?5.  Right lower extremity arteriogram ? ?She tolerated the procedure well and was taken to the PACU in stable condition.  ? ?By POD#1, her RLE symptoms were improved. Her right leg was well perfused and warm with palpable pulse in right foot. Right groin incision remained clean, dry and intact without  swelling or hematoma. She was continued on Aspirin, statin, and Plavix. PT/ OT evaluated with recommendation for Home health PT/OT. Tolerating diet. Voiding without difficulty.  ? ?The remainder of her hospital course consisted of increased mobilization, pain control, and tolerance of increased po intake.  ? ?By POD#3 she remained stable for discharge home. Her right leg remained well perfused with palpable pulse. Her right groin incision intact and well appearing. Home PT was ordered and arranged with [facility]. She will resume her home medications as prescribed. She does not tolerate pain medication other than Tylenol, which she has at home. She will continue Plavix, Aspirin and Statin. New prescriptions for Aspirin and increased statin dose were sent to her requested pharmacy. She has follow up arranged in 2-3 weeks for incision check.  ? ? ?Consults: None ? ?Treatments: analgesia: acetaminophen, therapies: PT and OT, and surgery: 1.  Right common femoral artery endarterectomy including profundoplasty and endarterectomy of the proximal SFA with bovine pericardial patch angioplasty ?2.  Ultrasound-guided access left common femoral artery ?3.  Aortogram with bilateral iliac arteriogram ?4.  Bilateral common iliac artery angioplasty with stent placement with kissing stents (8 mm x 59 mm VBX right common iliac artery and 8 mm x 29 mm VBX left common iliac artery ?5.  Right lower extremity arteriogram ? ? ?Disposition: Discharge disposition: 01-Home or Self Care ? ? ? ? ? ? ?- For [facility] Registry use --- ? ?Post-op:  ?Wound infection: No  ?Graft infection: No  ?Transfusion: No  If yes, 0 units given ?New Arrhythmia: No ?Patency judged by: '[ ]'$  Dopper only, '[ ]'$  Palpable graft pulse, [ X] Palpable distal pulse, '[ ]'$  ABI  inc. > 0.15, '[ ]'$  Duplex ?D/C Ambulatory Status: Ambulatory ? ?Complications: ?MI: [ X] No, '[ ]'$  Troponin only, '[ ]'$  EKG or Clinical ?CHF: No ?Resp failure: Valu.Nieves ] none, '[ ]'$  Pneumonia, '[ ]'$  Ventilator ?Chg in renal  function: Valu.Nieves ] none, '[ ]'$  Inc. Cr > 0.5, '[ ]'$  Temp. Dialysis, '[ ]'$  Permanent dialysis ?Stroke: [ X] None, '[ ]'$  Minor, '[ ]'$  Major ?Return to OR: No  ?Reason for return to OR: '[ ]'$  Bleeding, '[ ]'$  Infection, '[ ]'$  Thrombosis, '[ ]'$  Revision ? ?Discharge medications: ?Statin use:  Yes ?ASA use:  Yes ?Plavix use:  Yes ?Beta blocker use: No, not indicated ?Coumadin use: No  for medical reason not indicated ? ? ? ?Patient Instructions:  ?Allergies as of 02/12/2022   ? ?   Reactions  ? Augmentin [amoxicillin-pot Clavulanate] Other (See Comments)  ? ABDONIMAL PAIN  ? Clavulanic Acid Hives  ? Codeine Hives, Other (See Comments)  ? Alprazolam Other (See Comments)  ? OVER SEDATES  ? Bee Venom Hives  ? Sulfa Antibiotics Rash, Other (See Comments)  ? Tramadol   ? Insomnia   ? Avelox [moxifloxacin] Rash  ? Hydrocodone-acetaminophen Other (See Comments)  ? Insomnia   ? ?  ? ?  ?Medication List  ?  ? ?STOP taking these medications   ? ?simvastatin 20 MG tablet ?Commonly known as: ZOCOR ?  ? ?  ? ?TAKE these medications   ? ?acetaminophen 500 MG tablet ?Commonly known as: TYLENOL ?Take 500-1,000 mg by mouth every 6 (six) hours as needed for moderate pain. ?  ?albuterol 108 (90 Base) MCG/ACT inhaler ?Commonly known as: VENTOLIN HFA ?Inhale 1-2 puffs into the lungs 4 (four) times daily as needed for shortness of breath or wheezing. ?  ?aspirin 81 MG EC tablet ?Take 1 tablet (81 mg total) by mouth daily. Swallow whole. ?  ?atorvastatin 80 MG tablet ?Commonly known as: LIPITOR ?Take 1 tablet (80 mg total) by mouth at bedtime. ?  ?carboxymethylcellulose 0.5 % Soln ?Commonly known as: REFRESH PLUS ?Place 1 drop into both eyes 2 (two) times daily as needed (dry/irritated eyes.). ?  ?clopidogrel 75 MG tablet ?Commonly known as: PLAVIX ?Take 75 mg by mouth daily. ?  ?diazepam 5 MG tablet ?Commonly known as: VALIUM ?Take 5 mg by mouth 3 (three) times daily as needed for anxiety. ?  ?diphenhydramine-acetaminophen 25-500 MG Tabs tablet ?Commonly known as:  TYLENOL PM ?Take 1-2 tablets by mouth at bedtime as needed (sleep / pain). ?  ?docusate sodium 100 MG capsule ?Commonly known as: COLACE ?Take 100-200 mg by mouth at bedtime as needed for mild constipation. ?  ?Fish Oil Ultra 1400 MG Caps ?Take 1,400 mg by mouth daily. ?  ?gabapentin 100 MG capsule ?Commonly known as: NEURONTIN ?Take 100 mg by mouth 2 (two) times daily. ?  ?lisinopril 20 MG tablet ?Commonly known as: ZESTRIL ?Take 20 mg by mouth daily. ?  ?melatonin 3 MG Tabs tablet ?Take 3-6 mg by mouth at bedtime as needed (sleep). ?  ?mometasone 50 MCG/ACT nasal spray ?Commonly known as: NASONEX ?Place 2 sprays into the nose daily as needed (allergies). ?  ?montelukast 10 MG tablet ?Commonly known as: SINGULAIR ?Take 10 mg by mouth at bedtime. ?  ?pantoprazole 20 MG tablet ?Commonly known as: PROTONIX ?Take 20 mg by mouth daily before breakfast. ?  ?polycarbophil 625 MG tablet ?Commonly known as: FIBERCON ?Take 625 mg by mouth in the morning and at bedtime. ?  ? ?  ? ?  ?  ? ? ?  ?  Discharge Care Instructions  ?(From admission, onward)  ?  ? ? ?  ? ?  Start     Ordered  ? 02/12/22 0000  Discharge wound care:       ?Comments: Wash incision with mild soap and water, pat dry. Do not soak in bathtub  ? 02/12/22 0759  ? ?  ?  ? ?  ? ?Activity: activity as tolerated, no driving while on analgesics, and no heavy lifting for 4 weeks ?Diet: regular diet and low fat, low cholesterol diet ?Wound Care: keep wound clean and dry ? ?Follow-up with Dr. Carlis Abbott in  2-3  weeks. ? ?Signed: ?Gioia Ranes ?02/13/2022 ?10:05 AM ?

## 2022-02-13 NOTE — Telephone Encounter (Signed)
Pt called with c/o R ankle swelling since she came home from hospital yesterday. She denies any redness/warmth. MD has advised her to wrap the ankle with an ACE wrap. She is going to call us back if this changes/worsens.  ?

## 2022-03-02 NOTE — Progress Notes (Signed)
?POST OPERATIVE OFFICE NOTE ? ? ? ?CC:  F/u for surgery ? ?HPI:  This is a 81 y.o. female who is s/p right CFA endarterectomy  including profundoplasty and endarterectomy of the proximal SFA with bovine pericardial patch angioplasty, aortogram with bilateral CIA  angioplasty with stent placement with kissing stents  on 02/09/2022 by Dr. Carlis Abbott for CLI of RLE with rest pain.  At completion, she had much more brisk flow in the foot with multiphasic doppler signal.   ? ?She is on asa/plavix/statin. ? ?Pt returns today for follow up.  Pt states her feet are feeling better and are warmer.  She states that her incision is healing.  She does not use her walker at home.  She states she does not get cramping in her calves with walking.  She is compliant with her asa/statin/plavix.  She does have some back issues that she has had surgery for in the past. She is currently smoking.  She does take care of her husband with dementia. ? ? ?Allergies  ?Allergen Reactions  ? Augmentin [Amoxicillin-Pot Clavulanate] Other (See Comments)  ?  ABDONIMAL PAIN ?  ? Clavulanic Acid Hives  ? Codeine Hives and Other (See Comments)  ? Alprazolam Other (See Comments)  ?  OVER SEDATES ?  ? Bee Venom Hives  ? Sulfa Antibiotics Rash and Other (See Comments)  ? Tramadol   ?  Insomnia   ? Avelox [Moxifloxacin] Rash  ? Hydrocodone-Acetaminophen Other (See Comments)  ?  Insomnia   ? ? ?Current Outpatient Medications  ?Medication Sig Dispense Refill  ? acetaminophen (TYLENOL) 500 MG tablet Take 500-1,000 mg by mouth every 6 (six) hours as needed for moderate pain.    ? albuterol (VENTOLIN HFA) 108 (90 Base) MCG/ACT inhaler Inhale 1-2 puffs into the lungs 4 (four) times daily as needed for shortness of breath or wheezing.    ? aspirin EC 81 MG EC tablet Take 1 tablet (81 mg total) by mouth daily. Swallow whole. 30 tablet 11  ? atorvastatin (LIPITOR) 80 MG tablet Take 1 tablet (80 mg total) by mouth at bedtime. 30 tablet 11  ? carboxymethylcellulose  (REFRESH PLUS) 0.5 % SOLN Place 1 drop into both eyes 2 (two) times daily as needed (dry/irritated eyes.).    ? clopidogrel (PLAVIX) 75 MG tablet Take 75 mg by mouth daily.    ? diazepam (VALIUM) 5 MG tablet Take 5 mg by mouth 3 (three) times daily as needed for anxiety.    ? diphenhydramine-acetaminophen (TYLENOL PM) 25-500 MG TABS tablet Take 1-2 tablets by mouth at bedtime as needed (sleep / pain).    ? docusate sodium (COLACE) 100 MG capsule Take 100-200 mg by mouth at bedtime as needed for mild constipation.    ? gabapentin (NEURONTIN) 100 MG capsule Take 100 mg by mouth 2 (two) times daily.    ? lisinopril (ZESTRIL) 20 MG tablet Take 20 mg by mouth daily.    ? melatonin 3 MG TABS tablet Take 3-6 mg by mouth at bedtime as needed (sleep).    ? mometasone (NASONEX) 50 MCG/ACT nasal spray Place 2 sprays into the nose daily as needed (allergies).    ? montelukast (SINGULAIR) 10 MG tablet Take 10 mg by mouth at bedtime.    ? Omega-3 Fatty Acids (FISH OIL ULTRA) 1400 MG CAPS Take 1,400 mg by mouth daily.    ? pantoprazole (PROTONIX) 20 MG tablet Take 20 mg by mouth daily before breakfast.    ? polycarbophil (FIBERCON) 625  MG tablet Take 625 mg by mouth in the morning and at bedtime.    ? ?No current facility-administered medications for this visit.  ? ? ? ROS:  See HPI ? ?Physical Exam: ? ?Today's Vitals  ? 03/03/22 1224  ?BP: 124/70  ?Pulse: 78  ?Resp: 18  ?Temp: (!) 97.2 ?F (36.2 ?C)  ?TempSrc: Temporal  ?SpO2: 96%  ?Weight: 126 lb 6.4 oz (57.3 kg)  ?Height: '5\' 2"'$  (1.575 m)  ?PainSc: 2   ? ?Body mass index is 23.12 kg/m?. ? ? ?Incision:  healing nicely ?Extremities:  palpable DP pulses bilaterally.  Bilateral feet are warm and well perfused.   ? ? ? ?Assessment/Plan:  This is a 81 y.o. female who is s/p: ?right CFA endarterectomy  including profundoplasty and endarterectomy of the proximal SFA with bovine pericardial patch angioplasty, aortogram with bilateral CIA  angioplasty with stent placement with kissing  stents  on 02/09/2022 by Dr. Carlis Abbott for CLI of RLE with rest pain.  At completion, she had much more brisk flow in the foot with multiphasic doppler signal.   ? ?-pt doing well with palpable DP pulses bilaterally.  Her pre operative sx are better.  She does still have some numbness and I discussed with her that it could take a long time to get that back if ever.  She will continue walking regimen.   ?-continue asa/statin/plavix ?-she will f/u in 6 months with ABI and aortoiliac duplex and see Dr. Carlis Abbott.  She will call us sooner if she has any issues before then. ?-she is currently smoking.  Discussed the importance of smoking cessation.  ? ? ?Leontine Locket, PAC ?[facility] ?[phone] ? ? ?Clinic MD:  Carlis Abbott ? ?

## 2022-03-03 ENCOUNTER — Encounter: Payer: Self-pay | Admitting: Physician Assistant

## 2022-03-03 ENCOUNTER — Ambulatory Visit (INDEPENDENT_AMBULATORY_CARE_PROVIDER_SITE_OTHER): Payer: Medicare Other | Admitting: Physician Assistant

## 2022-03-03 VITALS — BP 124/70 | HR 78 | Temp 97.2°F | Resp 18 | Ht 62.0 in | Wt 126.4 lb

## 2022-03-03 DIAGNOSIS — F17200 Nicotine dependence, unspecified, uncomplicated: Secondary | ICD-10-CM

## 2022-03-03 DIAGNOSIS — I739 Peripheral vascular disease, unspecified: Secondary | ICD-10-CM

## 2022-03-03 DIAGNOSIS — I70221 Atherosclerosis of native arteries of extremities with rest pain, right leg: Secondary | ICD-10-CM

## 2022-03-06 ENCOUNTER — Other Ambulatory Visit: Payer: Self-pay | Admitting: *Deleted

## 2022-03-06 DIAGNOSIS — I739 Peripheral vascular disease, unspecified: Secondary | ICD-10-CM

## 2022-03-06 DIAGNOSIS — M79606 Pain in leg, unspecified: Secondary | ICD-10-CM

## 2022-05-15 ENCOUNTER — Telehealth: Payer: Self-pay | Admitting: Gastroenterology

## 2022-05-15 ENCOUNTER — Telehealth: Payer: Self-pay

## 2022-05-15 NOTE — Telephone Encounter (Signed)
I spoke with the pt and she tells me that she has constant diarrhea for the past 2 months.  She has had several recent surgeries over the past 7 months.  She is unsure of abx use.  She has tried a [employer] without relief.  She has not been seen since 2021, I scheduled her to see Dr Rush Landmark at his first available as well as 7/27 with Amy Moore.  She has been advised that she can try imodium and should also call her PCP and surgeon in the meantime.  The pt has been advised of the information and verbalized understanding.

## 2022-05-15 NOTE — Telephone Encounter (Signed)
Patient called saying she was having issues with severe diarrhea.  Everything she eats goes right through her.  She's tried the [employer] and it has not worked for her.  She has recently had vascular surgery and doesn't know if that is part of it.  Please call patient and advise.  Thank you.

## 2022-05-15 NOTE — Telephone Encounter (Signed)
Pt called stating that she was having continuous diarrhea and needed some direction.  Reviewed pt's chart, returned pt's call for clarification, two identifiers used. Informed pt that she would need to contact her PCP for the diarrhea. Pt stated that she had called her GI doc. She said that she'd been losing weight and had a poor appetite. Pt stated that he R foot had a numb senstion on the top and her toes were cold, while the foot was warm. She denies any swelling or wounds. Pt will monitor foot and call next week to make appt after she gets daughter's schedule. Confirmed understanding.

## 2022-05-21 ENCOUNTER — Encounter: Payer: Self-pay | Admitting: Gastroenterology

## 2022-05-21 ENCOUNTER — Ambulatory Visit: Payer: Medicare Other | Admitting: Physician Assistant

## 2022-06-23 ENCOUNTER — Encounter: Payer: Self-pay | Admitting: Gastroenterology

## 2022-06-23 ENCOUNTER — Ambulatory Visit: Payer: Medicare Other | Admitting: Gastroenterology

## 2022-06-23 VITALS — BP 128/58 | HR 76 | Ht 60.0 in | Wt 124.2 lb

## 2022-06-23 DIAGNOSIS — F5000 Anorexia nervosa, unspecified: Secondary | ICD-10-CM

## 2022-06-23 DIAGNOSIS — Z86010 Personal history of colonic polyps: Secondary | ICD-10-CM | POA: Diagnosis not present

## 2022-06-23 DIAGNOSIS — Z87898 Personal history of other specified conditions: Secondary | ICD-10-CM | POA: Diagnosis not present

## 2022-06-23 DIAGNOSIS — R634 Abnormal weight loss: Secondary | ICD-10-CM | POA: Diagnosis not present

## 2022-06-23 DIAGNOSIS — R630 Anorexia: Secondary | ICD-10-CM | POA: Diagnosis not present

## 2022-06-23 DIAGNOSIS — Z7902 Long term (current) use of antithrombotics/antiplatelets: Secondary | ICD-10-CM

## 2022-06-23 DIAGNOSIS — Z8371 Family history of colonic polyps: Secondary | ICD-10-CM

## 2022-06-23 NOTE — Progress Notes (Unsigned)
GASTROENTEROLOGY OUTPATIENT CLINIC VISIT   Primary Care Provider Elyn Aquas [address] [place] [address] [phone]  Referring Provider Elyn Aquas [address] [address] [phone]  Patient Profile: Brandi Mcmahon is a 81 y.o. female with a pmh significant for  The patient presents to the [facility] for an evaluation and management of problem(s) noted below:  Problem List No diagnosis found.  History of Present Illness    The patient does/does not take NSAIDs or BC/Goody Powder. Patient has/has not had an EGD. Patient has/has not had a Colonoscopy.  GI Review of Systems Positive as above Negative for  Pyrosis; Reflux; Regurgitation; Dysphagia; Odynophagia; Globus; Post-prandial cough; Nocturnal cough; Nasal regurgitation; Epigastric pain; Nausea; Vomiting; Hematemesis; Jaundice; Change in Appetite; Early satiety; Abdominal pain; Abdominal bloating; Eructation; Flatulence; Change in BM Frequency; Change in BM Consistency; Constipation; Diarrhea; Incontinence; Urgency; Tenesmus; Hematochezia; Melena  Review of Systems General: Denies fevers/chills/weight loss/night sweats HEENT: Denies oral lesions/sore throat/headaches/visual changes Cardiovascular: Denies chest pain/palpitations Pulmonary: Denies shortness of breath/cough Gastroenterological: See HPI Genitourinary: Denies darkened urine or hematuria Hematological: Denies easy bruising/bleeding Endocrine: Denies temperature intolerance Dermatological: Denies skin changes Psychological: Mood is stable Allergy & Immunology: Denies severe allergic reactions Musculoskeletal: Denies new arthralgias   Medications Current Outpatient Medications  Medication Sig Dispense Refill   acetaminophen (TYLENOL) 500 MG tablet Take 500-1,000 mg by mouth every 6 (six) hours as needed for moderate pain.     albuterol (VENTOLIN HFA) 108 (90 Base) MCG/ACT inhaler  Inhale 1-2 puffs into the lungs 4 (four) times daily as needed for shortness of breath or wheezing.     aspirin EC 81 MG EC tablet Take 1 tablet (81 mg total) by mouth daily. Swallow whole. 30 tablet 11   atorvastatin (LIPITOR) 80 MG tablet Take 1 tablet (80 mg total) by mouth at bedtime. 30 tablet 11   carboxymethylcellulose (REFRESH PLUS) 0.5 % SOLN Place 1 drop into both eyes 2 (two) times daily as needed (dry/irritated eyes.).     clopidogrel (PLAVIX) 75 MG tablet Take 75 mg by mouth daily.     diazepam (VALIUM) 5 MG tablet Take 5 mg by mouth 3 (three) times daily as needed for anxiety.     diphenhydramine-acetaminophen (TYLENOL PM) 25-500 MG TABS tablet Take 1-2 tablets by mouth at bedtime as needed (sleep / pain).     docusate sodium (COLACE) 100 MG capsule Take 100-200 mg by mouth at bedtime as needed for mild constipation.     famotidine (PEPCID) 10 MG tablet Take 10 mg by mouth 2 (two) times daily. Take twice daily for 2 weeks and then as needed     gabapentin (NEURONTIN) 100 MG capsule Take 100 mg by mouth 2 (two) times daily.     lisinopril (ZESTRIL) 20 MG tablet Take 20 mg by mouth daily.     melatonin 3 MG TABS tablet Take 3-6 mg by mouth at bedtime as needed (sleep).     mometasone (NASONEX) 50 MCG/ACT nasal spray Place 2 sprays into the nose daily as needed (allergies).     montelukast (SINGULAIR) 10 MG tablet Take 10 mg by mouth at bedtime.     Omega-3 Fatty Acids (FISH OIL ULTRA) 1400 MG CAPS Take 1,400 mg by mouth daily.     pantoprazole (PROTONIX) 20 MG tablet Take 20 mg by mouth daily before breakfast. (Patient not taking: Reported on 03/03/2022)     polycarbophil (FIBERCON) 625 MG tablet Take 625  mg by mouth in the morning and at bedtime. (Patient not taking: Reported on 03/03/2022)     No current facility-administered medications for this visit.    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   GERD (gastroesophageal reflux disease)    Hyperlipidemia    Hypertension    Past Surgical History:  Procedure Laterality Date   AORTOGRAM N/A 02/09/2022   Procedure: AORTOGRAM WITH ILIAC ARTERIOGRAM;  Surgeon: [name], MD;  Location: [place];  Service: Vascular;  Laterality: N/A;   BREAST LUMPECTOMY Left 11/07/2009   for left DCIS   BUNIONECTOMY  2009   CARDIAC CATHETERIZATION     ~ 2004, reported results unremarkable and shoulder symptoms due to bursitis   COLONOSCOPY  2019   COLONOSCOPY WITH PROPOFOL N/A 10/23/2020   Procedure: COLONOSCOPY WITH PROPOFOL;  Surgeon: Irving Copas., MD;  Location[facility];  Service: Gastroenterology;  Laterality: N/A;   ENDARTERECTOMY FEMORAL Right 02/09/2022   Procedure: RIGHT COMMON FEMORAL ENDARTERECTOMY WITH PROFUNDA PLASTY AND SUPERFICIAL FEMORAL ARTERY ENDARTERECTOMY;  Surgeon: [name], MD;  Location: [place];  Service: Vascular;  Laterality: Right;   HEMOSTASIS CLIP PLACEMENT  10/23/2020   Procedure: HEMOSTASIS CLIP PLACEMENT;  Surgeon: Irving Copas., MD;  Location: [facility];  Service: Gastroenterology;;   INSERTION OF ILIAC STENT Bilateral 02/09/2022   Procedure: BILATERAL COMMON ILIAC ANGIOPLASTY WITH STENTS;  Surgeon: [name], MD;  Location: [place];  Service: Vascular;  Laterality: Bilateral;   INTRAOPERATIVE ARTERIOGRAM Right 02/09/2022   Procedure: RIGHT LOWER EXTREMITY ARTERIOGRAM;  Surgeon: [name], MD;  Location: [place];  Service: Vascular;  Laterality: Right;   LUMBAR LAMINECTOMY/DECOMPRESSION MICRODISCECTOMY Right 06/18/2021   Procedure: Laminectomy and Foraminotomy - right - Lumbar four-Lumbar five for resection of synovial cyst;  Surgeon: Kary Kos, MD;  Location: [place];  Service: Neurosurgery;  Laterality: Right;   PATCH ANGIOPLASTY Right 02/09/2022   Procedure: PATCH ANGIOPLASTY USING XENOSURE 1cm x 14cm BIOLOGIC PATCH;  Surgeon: [name], MD;  Location: [place];  Service: Vascular;  Laterality: Right;   POLYPECTOMY  10/23/2020   Procedure: POLYPECTOMY;  Surgeon: Rush Landmark Telford Nab., MD;  Location: [facility];  Service: Gastroenterology;;   [place] INJECTION  10/23/2020   Procedure: SUBMUCOSAL LIFTING INJECTION;  Surgeon: Irving Copas., MD;  Location: [facility];  Service: Gastroenterology;;   SUBMUCOSAL TATTOO INJECTION  10/23/2020   Procedure: SUBMUCOSAL TATTOO INJECTION;  Surgeon: Irving Copas., MD;  Location: [facility];  Service: Gastroenterology;;   TOTAL ABDOMINAL HYSTERECTOMY  1987   ULTRASOUND GUIDANCE FOR VASCULAR ACCESS Bilateral 02/09/2022   Procedure: ULTRASOUND GUIDANCE FOR VASCULAR ACCESS LEFT COMMON FEMORAL ARTERY;  Surgeon: [name], MD;  Location: [place];  Service: Vascular;  Laterality: Bilateral;   Social History  Socioeconomic History   Marital status: Married    Spouse name: Not on file   Number of children: Not on file   Years of education: Not on file   Highest education level: Not on file  Occupational History   Not on file  Tobacco Use   Smoking status: Some Days    Packs/day: 0.25    Types: Cigarettes   Smokeless tobacco: Never   Tobacco comments:    1-2 per day, trying to quit  Vaping Use   Vaping Use: Never used  Substance and Sexual Activity   Alcohol use: Not Currently   Drug use: Never   Sexual activity: Not on file  Other Topics Concern   Not on file  Social  History Narrative   Not on file   Social Determinants of Health   Financial Resource Strain: Not on file  Food Insecurity: Not on file  Transportation Needs: Not on file  Physical Activity: Not on file  Stress: Not on file  Social Connections: Not on file  Intimate Partner Violence: Not on file   Family History  Problem Relation Age of Onset   Breast cancer Mother    Lung cancer Father    Colon cancer Neg Hx    Colon polyps Neg Hx    Esophageal cancer Neg Hx    Rectal cancer Neg Hx    Stomach cancer Neg Hx    I have reviewed her medical, social, and family history in detail and updated the electronic medical record as necessary.    PHYSICAL EXAMINATION  Ht 5' (1.524 m) Comment: height measured without shoes  Wt 124 lb 4 oz (56.4 kg)   BMI 24.27 kg/m  Wt Readings from Last 3 Encounters:  06/23/22 124 lb 4 oz (56.4 kg)  03/03/22 126 lb 6.4 oz (57.3 kg)  02/09/22 125 lb (56.7 kg)   GEN: NAD, appears stated age, doesn't appear chronically ill PSYCH: Cooperative, without pressured speech EYE: Conjunctivae pink, sclerae anicteric ENT: MMM, without oral ulcers, no erythema or exudates noted NECK: Supple CV: RR without R/Gs  RESP: CTAB posteriorly, without wheezing GI: NABS, soft, NT/ND, without rebound or guarding, no HSM appreciated GU: DRE shows MSK/EXT: _ edema, no palmar erythema SKIN: No jaundice, no spider angiomata, no concerning rashes NEURO:  Alert & Oriented x 3, no focal deficits, no evidence of asterixis   REVIEW OF DATA  I reviewed the following data at the time of this encounter:  GI Procedures and Studies  ***  Laboratory Studies  ***  Imaging Studies  ***   ASSESSMENT  Ms. Obrien is a 81 y.o. female with a pmh significant for The patient is seen today for evaluation and management of:  No diagnosis found.  ***   PLAN  There are no diagnoses linked to this encounter.   No orders of the defined types were placed in this  encounter.   New Prescriptions   No medications on file   Modified Medications   No medications on file    Planned Follow Up No follow-ups on file.   Total Time in Face-to-Face and in Coordination of Care for patient including independent/personal interpretation/review of prior testing, medical history, examination, medication adjustment, communicating results with the patient directly, and documentation within the EHR is ***.   Justice Britain, MD [facility] Office # [phone]

## 2022-06-23 NOTE — Patient Instructions (Addendum)
_______________________________________________________  If you are age 81 or older, your body mass index should be between 23-30. Your Body mass index is 24.27 kg/m. If this is out of the aforementioned range listed, please consider follow up with your Primary Care Provider.  If you are age 71 or younger, your body mass index should be between 19-25. Your Body mass index is 24.27 kg/m. If this is out of the aformentioned range listed, please consider follow up with your Primary Care Provider.   ________________________________________________________  The [facility] providers would like to encourage you to use [facility] to communicate with providers for non-urgent requests or questions.  Due to long hold times on the telephone, sending your provider a message by [facility] may be a faster and more efficient way to get a response.  Please allow 48 business hours for a response.  Please remember that this is for non-urgent requests.  _______________________________________________________  Please try to incorporate Ensure in to your daily diet 1-2 a day.   Consider OJ and strawberries with ice and vanilla protein.  Follow up in 4 months.   It was a pleasure to see you today!  Thank you for trusting me with your gastrointestinal care!

## 2022-06-26 ENCOUNTER — Encounter: Payer: Self-pay | Admitting: Gastroenterology

## 2022-06-27 ENCOUNTER — Encounter: Payer: Self-pay | Admitting: Gastroenterology

## 2022-06-27 DIAGNOSIS — Z87898 Personal history of other specified conditions: Secondary | ICD-10-CM | POA: Insufficient documentation

## 2022-06-27 DIAGNOSIS — Z86010 Personal history of colonic polyps: Secondary | ICD-10-CM | POA: Insufficient documentation

## 2022-06-27 DIAGNOSIS — R630 Anorexia: Secondary | ICD-10-CM | POA: Insufficient documentation

## 2022-06-27 DIAGNOSIS — Z7902 Long term (current) use of antithrombotics/antiplatelets: Secondary | ICD-10-CM | POA: Insufficient documentation

## 2022-06-27 DIAGNOSIS — R634 Abnormal weight loss: Secondary | ICD-10-CM | POA: Insufficient documentation

## 2022-07-03 ENCOUNTER — Ambulatory Visit: Payer: Medicare Other | Admitting: Gastroenterology

## 2022-09-25 ENCOUNTER — Ambulatory Visit: Payer: Medicare Other | Admitting: Gastroenterology

## 2022-10-21 ENCOUNTER — Other Ambulatory Visit: Payer: Self-pay | Admitting: *Deleted

## 2022-10-21 DIAGNOSIS — M79606 Pain in leg, unspecified: Secondary | ICD-10-CM

## 2022-10-21 DIAGNOSIS — I70221 Atherosclerosis of native arteries of extremities with rest pain, right leg: Secondary | ICD-10-CM

## 2022-10-21 DIAGNOSIS — I739 Peripheral vascular disease, unspecified: Secondary | ICD-10-CM

## 2022-11-03 ENCOUNTER — Ambulatory Visit (HOSPITAL_COMMUNITY)
Admission: RE | Admit: 2022-11-03 | Discharge: 2022-11-03 | Disposition: A | Discharge status: Home / Self Care | Payer: Medicare Other | Source: Ambulatory Visit | Attending: Vascular Surgery | Admitting: Vascular Surgery

## 2022-11-03 ENCOUNTER — Encounter: Payer: Self-pay | Admitting: Vascular Surgery

## 2022-11-03 ENCOUNTER — Ambulatory Visit (INDEPENDENT_AMBULATORY_CARE_PROVIDER_SITE_OTHER)
Admission: RE | Admit: 2022-11-03 | Discharge: 2022-11-03 | Disposition: A | Discharge status: Home / Self Care | Payer: Medicare Other | Source: Ambulatory Visit | Attending: Vascular Surgery | Admitting: Vascular Surgery

## 2022-11-03 ENCOUNTER — Ambulatory Visit: Payer: Medicare Other | Admitting: Vascular Surgery

## 2022-11-03 VITALS — BP 151/95 | HR 73 | Temp 97.2°F | Resp 14 | Ht 61.0 in | Wt 122.0 lb

## 2022-11-03 DIAGNOSIS — M79606 Pain in leg, unspecified: Secondary | ICD-10-CM | POA: Diagnosis present

## 2022-11-03 DIAGNOSIS — I739 Peripheral vascular disease, unspecified: Secondary | ICD-10-CM

## 2022-11-03 DIAGNOSIS — I70221 Atherosclerosis of native arteries of extremities with rest pain, right leg: Secondary | ICD-10-CM

## 2022-11-03 LAB — VAS US ABI WITH/WO TBI
Left ABI: 0.95
Right ABI: 0.35

## 2022-11-03 NOTE — Progress Notes (Signed)
Patient name: Brandi Mcmahon MRN: [id number] DOB: Jun 08, 1941 Sex: female  REASON FOR CONSULT: 66 month follow-up  HPI: Brandi Mcmahon is a 82 y.o. female, with history of hypertension, hyperlipidemia, tobacco abuse that presents for 18-monthfollow-up and surveillance of her PAD.  She was previously seen by Dr. EDonnetta Hutchingin [place] rest pain in the right foot.  On 02/09/2022 I took her for right femoral endarterectomy with profundoplasty and bovine patch as well as bilateral common iliac artery stenting.  Her ABI's prior to intervention were 0.15 on the right.  She states her right foot is still doing better since intervention.  No longer walking with a cane or walker.  No pain or numbness in the foot.  Sleeping through the night.  She does state that she has these weird twinges.  Still smoking about 2 to 4 cigarettes a day.  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  Past Medical History:  Diagnosis Date   Allergic rhinitis    Allergy    seasonal and environmental   Anxiety    Arthritis    [place]   Left breast for DCIS   GERD (gastroesophageal reflux disease)    Hyperlipidemia    Hypertension    Shingles     Past Surgical History:  Procedure Laterality Date   AORTOGRAM N/A 02/09/2022   Procedure: AORTOGRAM WITH ILIAC ARTERIOGRAM;  Surgeon: CMarty Heck MD;  [place]  Service: Vascular;  Laterality: N/A;   BREAST LUMPECTOMY Left 11/07/2009   for left DCIS   BUNIONECTOMY  2009   CARDIAC CATHETERIZATION     ~ 2004, reported results unremarkable and shoulder symptoms due to bursitis   COLONOSCOPY  2019   COLONOSCOPY WITH PROPOFOL N/A 10/23/2020   Procedure: COLONOSCOPY WITH PROPOFOL;  Surgeon: MIrving Copas, MD;  [facility];  Service: Gastroenterology;  Laterality: N/A;   ENDARTERECTOMY FEMORAL Right 02/09/2022   Procedure: RIGHT COMMON FEMORAL ENDARTERECTOMY WITH  PROFUNDA PLASTY AND SUPERFICIAL FEMORAL ARTERY ENDARTERECTOMY;  Surgeon: CMarty Heck MD;  [place]  Service: Vascular;  Laterality: Right;   HEMOSTASIS CLIP PLACEMENT  10/23/2020   Procedure: HEMOSTASIS CLIP PLACEMENT;  Surgeon: MIrving Copas, MD;  [facility];  Service: Gastroenterology;;   INSERTION OF ILIAC STENT Bilateral 02/09/2022   Procedure: BILATERAL COMMON ILIAC ANGIOPLASTY WITH STENTS;  Surgeon: CMarty Heck MD;  [place]  Service: Vascular;  Laterality: Bilateral;   INTRAOPERATIVE ARTERIOGRAM Right 02/09/2022   Procedure: RIGHT LOWER EXTREMITY ARTERIOGRAM;  Surgeon: CMarty Heck MD;  [place] [facility]  Service: Vascular;  Laterality: Right;   LUMBAR LAMINECTOMY/DECOMPRESSION MICRODISCECTOMY Right 06/18/2021   Procedure: Laminectomy and Foraminotomy - right - Lumbar four-Lumbar five for resection of synovial cyst;  Surgeon: CKary Kos MD;  [place]  Service: Neurosurgery;  Laterality: Right;   PATCH ANGIOPLASTY Right 02/09/2022   Procedure: PATCH ANGIOPLASTY USING XENOSURE 1cm x 14cm BIOLOGIC PATCH;  Surgeon: CMarty Heck MD;  [place]  Service: Vascular;  Laterality: Right;   POLYPECTOMY  10/23/2020   Procedure: POLYPECTOMY;  Surgeon: MRush LandmarkGTelford Nab, MD;  [facility];  Service: Gastroenterology;;   [place]  10/23/2020   Procedure: SUBMUCOSAL LIFTING INJECTION;  Surgeon: MIrving Copas, MD;  [facility];  Service: Gastroenterology;;   SUBMUCOSAL TATTOO INJECTION  10/23/2020   Procedure: SUBMUCOSAL TATTOO INJECTION;  Surgeon: Rush Landmark Telford Nab., MD;  [facility];  Service: Gastroenterology;;   TOTAL ABDOMINAL HYSTERECTOMY  1987   ULTRASOUND GUIDANCE FOR VASCULAR ACCESS Bilateral 02/09/2022   Procedure: ULTRASOUND GUIDANCE FOR VASCULAR ACCESS LEFT COMMON FEMORAL ARTERY;  Surgeon: [name], MD;  [facility];   Service: Vascular;  Laterality: Bilateral;    Family History  Problem Relation Age of Onset   Breast cancer Mother    Lung cancer Father    Colon cancer Neg Hx    Colon polyps Neg Hx    Esophageal cancer Neg Hx    Rectal cancer Neg Hx    Stomach cancer Neg Hx    Inflammatory bowel disease Neg Hx    Liver disease Neg Hx    Pancreatic cancer Neg Hx     SOCIAL HISTORY: Social History   Socioeconomic History   Marital status: Married    Spouse name: Not on file   Number of children: Not on file   Years of education: Not on file   Highest education level: Not on file  Occupational History   Not on file  Tobacco Use   Smoking status: Some Days    Packs/day: 0.25    Types: Cigarettes   Smokeless tobacco: Never   Tobacco comments:    1-2 per day, trying to quit  Vaping Use   Vaping Use: Never used  Substance and Sexual Activity   Alcohol use: Not Currently   Drug use: Never   Sexual activity: Not on file  Other Topics Concern   Not on file  Social History Narrative   Not on file   Social Determinants of Health   Financial Resource Strain: Not on file  Food Insecurity: Not on file  Transportation Needs: Not on file  Physical Activity: Not on file  Stress: Not on file  Social Connections: Not on file  Intimate Partner Violence: Not on file    Allergies  Allergen Reactions   Augmentin [Amoxicillin-Pot Clavulanate] Other (See Comments)    ABDONIMAL PAIN    Clavulanic Acid Hives   Codeine Hives and Other (See Comments)   Alprazolam Other (See Comments)    OVER SEDATES    Bee Venom Hives   Sulfa Antibiotics Rash and Other (See Comments)   Tramadol     Insomnia    Avelox [Moxifloxacin] Rash   Hydrocodone-Acetaminophen Other (See Comments)    Insomnia     Current Outpatient Medications  Medication Sig Dispense Refill   acetaminophen (TYLENOL) 500 MG tablet Take 500-1,000 mg by mouth every 6 (six) hours as needed for moderate pain.     albuterol (VENTOLIN  HFA) 108 (90 Base) MCG/ACT inhaler Inhale 1-2 puffs into the lungs 4 (four) times daily as needed for shortness of breath or wheezing.     aspirin EC 81 MG EC tablet Take 1 tablet (81 mg total) by mouth daily. Swallow whole. 30 tablet 11   carboxymethylcellulose (REFRESH PLUS) 0.5 % SOLN Place 1 drop into both eyes 2 (two) times daily as needed (dry/irritated eyes.).     clopidogrel (PLAVIX) 75 MG tablet Take 75 mg by mouth daily.     diazepam (VALIUM) 5 MG tablet Take 5 mg by mouth 3 (three) times daily as needed for anxiety.     diphenhydramine-acetaminophen (TYLENOL PM) 25-500 MG TABS tablet Take 1-2 tablets by mouth at bedtime as needed (sleep / pain).     docusate sodium (COLACE) 100 MG capsule Take 100-200 mg by mouth at  bedtime as needed for mild constipation.     famotidine (PEPCID) 40 MG tablet Take 1 tablet by mouth as needed.     gabapentin (NEURONTIN) 100 MG capsule Take 100 mg by mouth 2 (two) times daily.     lisinopril (ZESTRIL) 20 MG tablet Take 20 mg by mouth daily.     melatonin 3 MG TABS tablet Take 3-6 mg by mouth at bedtime as needed (sleep).     mometasone (NASONEX) 50 MCG/ACT nasal spray Place 2 sprays into the nose daily as needed (allergies).     montelukast (SINGULAIR) 10 MG tablet Take 10 mg by mouth at bedtime.     Multiple Vitamin (MULTI-VITAMIN) tablet Take 1 tablet by mouth daily.     Omega-3 Fatty Acids (FISH OIL ULTRA) 1400 MG CAPS Take 1,400 mg by mouth daily.     pantoprazole (PROTONIX) 20 MG tablet Take 20 mg by mouth daily before breakfast.     polycarbophil (FIBERCON) 625 MG tablet Take 625 mg by mouth in the morning and at bedtime.     rosuvastatin (CRESTOR) 20 MG tablet Take 20 mg by mouth daily.     No current facility-administered medications for this visit.    REVIEW OF SYSTEMS:  '[X]'$  denotes positive finding, '[ ]'$  denotes negative finding Cardiac  Comments:  Chest pain or chest pressure:    Shortness of breath upon exertion:    Short of breath when  lying flat:    Irregular heart rhythm:        Vascular    Pain in calf, thigh, or hip brought on by ambulation:    Pain in feet at night that wakes you up from your sleep:     Blood clot in your veins:    Leg swelling:         Pulmonary    Oxygen at home:    Productive cough:     Wheezing:         Neurologic    Sudden weakness in arms or legs:     Sudden numbness in arms or legs:     Sudden onset of difficulty speaking or slurred speech:    Temporary loss of vision in one eye:     Problems with dizziness:         Gastrointestinal    Blood in stool:     Vomited blood:         Genitourinary    Burning when urinating:     Blood in urine:        Psychiatric    Major depression:         Hematologic    Bleeding problems:    Problems with blood clotting too easily:        Skin    Rashes or ulcers:        Constitutional    Fever or chills:      PHYSICAL EXAM: Vitals:   11/03/22 1038  BP: (!) 151/95  Pulse: 73  Resp: 14  Temp: (!) 97.2 F (36.2 C)  TempSrc: Temporal  SpO2: 93%  Weight: 122 lb (55.3 kg)  Height: '5\' 1"'$  (1.549 m)    GENERAL: The patient is a well-nourished female, in no acute distress. The vital signs are documented above. CARDIAC: There is a regular rate and rhythm.  VASCULAR:  Left femoral pulse palpable Hard to appreciate right femoral pulse Left DP palpable No palpable right pedal pulses but foot warm with no tissue loss PULMONARY: No respiratory  distress. ABDOMEN: Soft and non-tender. MUSCULOSKELETAL: There are no major deformities or cyanosis. NEUROLOGIC: No focal weakness or paresthesias are detected. SKIN: There are no ulcers or rashes noted. PSYCHIATRIC: The patient has a normal affect.  DATA:   ABIs today are 0.35 on the right and 0.95 on the left  Aortoiliac duplex shows both iliac artery stents patent with no flow-limiting stenosis  Assessment/Plan:  82 y.o. female, with history of hypertension, hyperlipidemia, tobacco  abuse that presents for 83-monthfollow-up and surveillance of her PAD.  She was previously seen by Dr. EDonnetta Hutchingin RBolckowwith rest pain in the right foot.  On 02/09/2022 I took her for right femoral endarterectomy with profundoplasty and bovine patch as well as bilateral common iliac artery stenting.  Overall her right leg symptoms are much better since intervention.  No longer having pain in the foot through the night and no longer walking with a walker or cane.  I would expect her ABIs to be better on the right as I discussed with her and her daughter.  That being said she really does not endorse any pain in the foot or numbness like she was having before.  We discussed the option of repeat angiogram if she feels her right foot is bothering her versus continued observation.  Given overall improvement we will continue to observe for now.  She wants to try and avoid any additional invasive procedures.  I will see her in 3 months just to check on her clinical status.  Discussed the importance of smoking cessation.   CMarty Heck MD [facility] G[facility]ningOffice: [phone]

## 2022-12-16 ENCOUNTER — Telehealth: Payer: Self-pay | Admitting: Gastroenterology

## 2022-12-16 NOTE — Telephone Encounter (Signed)
PT is calling with concerns of rapid weight loss along with severe diarrhea and nausea. She scheduled with PA but that the symptoms came so fast that it has her a little afraid. Please advise.

## 2022-12-16 NOTE — Telephone Encounter (Signed)
The pt has an appt on 3/26 with Nevin Bloodgood and would like to see if she can be seen sooner.  I have offered her several dates with mid levels however she is not able to accept due to her daughters work schedule.  She has some diarrhea and nausea.  I have asked her to push fluids as much as possible and eat toast, apple sauce and bland foods as she can tolerate.  She will try imodium for the diarrhea.  She will call back with her daughters scheduled to see if any appts open up.  She will also call back if she has any worse symptoms.  She will also call her PCP to see if they can see her in the meantime.  She is concerned about weight loss (same complaint in 05/2022) weight is 115 lbs today and is down from 124 lbs in August.

## 2022-12-25 ENCOUNTER — Telehealth: Payer: Self-pay | Admitting: Gastroenterology

## 2022-12-25 NOTE — Telephone Encounter (Signed)
PT has been experiencing a very upset stomach along with not being able to keep any foods down. This has been happening the past three weeks and she has tried all she can to get relief. She has an appointment scheduled for the 26th. Please advise.

## 2022-12-25 NOTE — Telephone Encounter (Signed)
Left message on machine to call back  

## 2022-12-28 NOTE — Telephone Encounter (Signed)
Left message on machine to call back  

## 2022-12-29 NOTE — Telephone Encounter (Signed)
Left message on machine to call back   Unable to reach pt by phone will await further communication from the pt

## 2023-01-19 ENCOUNTER — Ambulatory Visit: Payer: Medicare Other | Admitting: Nurse Practitioner

## 2023-01-19 ENCOUNTER — Encounter: Payer: Self-pay | Admitting: Nurse Practitioner

## 2023-01-19 DIAGNOSIS — R634 Abnormal weight loss: Secondary | ICD-10-CM

## 2023-01-19 DIAGNOSIS — R1084 Generalized abdominal pain: Secondary | ICD-10-CM | POA: Diagnosis not present

## 2023-01-19 DIAGNOSIS — Z7901 Long term (current) use of anticoagulants: Secondary | ICD-10-CM

## 2023-01-19 NOTE — Patient Instructions (Signed)
_______________________________________________________  If your blood pressure at your visit was 140/90 or greater, please contact your primary care physician to follow up on this.  _______________________________________________________  If you are age 82 or older, your body mass index should be between 23-30. Your Body mass index is 23.05 kg/m. If this is out of the aforementioned range listed, please consider follow up with your Primary Care Provider. ________________________________________________________  The [place] [facility] providers would like to encourage you to use [facility] to communicate with providers for non-urgent requests or questions.  Due to long hold times on the telephone, sending your provider a message by [facility] may be a faster and more efficient way to get a response.  Please allow 48 business hours for a response.  Please remember that this is for non-urgent requests.  _______________________________________________________  Dennis Bast have been scheduled for an endoscopy. Please follow written instructions given to you at your visit today. If you use inhalers (even only as needed), please bring them with you on the day of your procedure.  Due to recent changes in healthcare laws, you may see the results of your imaging and laboratory studies on MyChart before your provider has had a chance to review them.  We understand that in some cases there may be results that are confusing or concerning to you. Not all laboratory results come back in the same time frame and the provider may be waiting for multiple results in order to interpret others.  Please give [place] 48 hours in order for your provider to thoroughly review all the results before contacting the office for clarification of your results.   You will be contacted by our office prior to your procedure for directions on holding your Plavix.  If you do not hear from our office 1 week prior to your scheduled procedure, please call  [phone] to discuss.   Thank you for entrusting me with your care and choosing [facility].  Tye Savoy, NP

## 2023-01-19 NOTE — Progress Notes (Signed)
Assessment   82 yo female with the following:   Early satiety, weight loss, frequent postprandial generalized abdominal pain  History of large hyperplastic polyps resected in Dec 2021  PAD, on plavix   Plan   -As discussed at last visit, will proceed with EGD given ongoing weight loss / early satiety. The risks and benefits of EGD with possible biopsies were discussed with the patient who agrees to proceed.  - If EGD negative consider CT A to evaluate for chronic mesenteric ischemia given the meal associated abdominal pain / weight loss   -Hold Plavix for 5 days before procedure - will instruct when and how to resume after procedure. Patient understands that there is a low but real risk of cardiovascular event such as heart attack, stroke, or embolism /  thrombosis, or ischemia while off Plavix. The patient consents to proceed. Will communicate by phone or EMR with patient's prescribing provider to confirm that holding Plavix is reasonable in this case.  -Dr. Rush Landmark if okay to do the EGD on Plavix if Vascular Surgery is  uncomfortable holding it.    History of Present Illness   Chief complaint:  follow up on abdominal pain and weight loss    Brandi Mcmahon is a 82 y.o. female known to Dr. Rush Landmark with a PMH of PAD, HTN, colon polyps (large hyperplastic polyp resected in 2021)  She was evaluated here 06/23/22 for diarrhea , anorexia, weight loss. She had been undergoing multiple surgical interventions and believed that many of these interventions/procedures had contributed. Additionally she had some stressors at home taking care of husband.  Plan was for EGD  and probable CT is weight loss persisted.  H.pylori testing discussed patient patient deferred.   Updated History:  Patient has returned for follow up . She continues to unintentionally lose weight. She was 124 pounds at last visit the end of August and is 119 pounds today. Sometimes wakes up in the morning with nausea.  She has early satiety. She is consuming 2 protein shakes a day. She tries to follow a bland diet because food often causes her generalized abdominal pain. The pain isn't the result of any particular food. In fact the sometimes she eats the same food but doesn't get pain.   The diarrhea she was having when here in August has resolved.   Labs:     Latest Ref Rng & Units 02/09/2022    9:24 AM  Hepatic Function  Total Protein 6.5 - 8.1 g/dL 6.8   Albumin 3.5 - 5.0 g/dL 3.8   AST 15 - 41 U/L 18   ALT 0 - 44 U/L 12   Alk Phosphatase 38 - 126 U/L 90   Total Bilirubin 0.3 - 1.2 mg/dL 0.3        Latest Ref Rng & Units 02/10/2022    5:28 AM 02/09/2022    3:10 PM 02/09/2022    9:24 AM  CBC  WBC 4.0 - 10.5 K/uL 9.7  7.1  7.8   Hemoglobin 12.0 - 15.0 g/dL 9.8  9.6  12.8   Hematocrit 36.0 - 46.0 % 28.4  28.8  38.4   Platelets 150 - 400 K/uL 236  213  284      Previous GI Evaluation   December 2021 colonoscopy - Hemorrhoids found on digital rectal exam. - The examined portion of the ileum was normal. - Multiple 2 to 5 mm polyps in the rectum, at the recto-sigmoid colon, in the sigmoid colon  and in the descending colon - consistent with hyperplastic polyps endoscopically, a few were sampled with a cold snare. - One 28 mm polyp at the recto-sigmoid colon, removed with piecemeal cold mucosal resection. Resected and retrieved. Clips (MR conditional) were placed. Tattooed distally. - Normal mucosa in the entire examined colon otherwise. - Diverticulosis in the recto-sigmoid colon, in the sigmoid colon and in the descending colon leading to tortuosity in the right colon. - Non-bleeding non-thrombosed external and internal hemorrhoids.   Pathology FINAL MICROSCOPIC DIAGNOSIS:  A. COLON, DESCENDING, POLYPECTOMY:  - Hyperplastic polyp  B. 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   GERD (gastroesophageal reflux disease)    Hyperlipidemia    Hypertension    Shingles     Past Surgical History:  Procedure Laterality Date   AORTOGRAM N/A 02/09/2022   Procedure: AORTOGRAM WITH ILIAC ARTERIOGRAM;  Surgeon: [name], MD;  Location: [place];  Service: Vascular;  Laterality: N/A;   BREAST LUMPECTOMY Left 11/07/2009   for left DCIS   BUNIONECTOMY  2009   CARDIAC CATHETERIZATION     ~ 2004, reported results unremarkable and shoulder symptoms due to bursitis   COLONOSCOPY  2019   COLONOSCOPY WITH PROPOFOL N/A 10/23/2020   Procedure: COLONOSCOPY WITH PROPOFOL;  Surgeon: Irving Copas., MD;  Location: [facility];  Service: Gastroenterology;  Laterality: N/A;   ENDARTERECTOMY FEMORAL Right 02/09/2022   Procedure: RIGHT COMMON FEMORAL ENDARTERECTOMY WITH PROFUNDA PLASTY AND SUPERFICIAL FEMORAL ARTERY ENDARTERECTOMY;  Surgeon: [name], MD;  Location: [place];  Service: Vascular;  Laterality: Right;   HEMOSTASIS CLIP PLACEMENT  10/23/2020   Procedure: HEMOSTASIS CLIP PLACEMENT;  Surgeon: Irving Copas., MD;  Location: [facility];  Service: Gastroenterology;;   INSERTION OF ILIAC STENT Bilateral 02/09/2022   Procedure: BILATERAL COMMON ILIAC ANGIOPLASTY WITH STENTS;  Surgeon: [name], MD;  Location: [place];  Service: Vascular;  Laterality: Bilateral;   INTRAOPERATIVE ARTERIOGRAM Right 02/09/2022   Procedure: RIGHT LOWER EXTREMITY ARTERIOGRAM;  Surgeon: [name], MD;  Location: [place];  Service: Vascular;  Laterality: Right;   LUMBAR LAMINECTOMY/DECOMPRESSION MICRODISCECTOMY Right 06/18/2021   Procedure: Laminectomy and Foraminotomy - right - Lumbar four-Lumbar five for resection of synovial cyst;  Surgeon: Kary Kos, MD;  Location: [place];  Service: Neurosurgery;  Laterality: Right;   PATCH ANGIOPLASTY Right 02/09/2022    Procedure: PATCH ANGIOPLASTY USING XENOSURE 1cm x 14cm BIOLOGIC PATCH;  Surgeon: [name], MD;  Location: [place];  Service: Vascular;  Laterality: Right;   POLYPECTOMY  10/23/2020   Procedure: POLYPECTOMY;  Surgeon: Rush Landmark Telford Nab., MD;  Location: [facility];  Service: Gastroenterology;;   [place] INJECTION  10/23/2020   Procedure: SUBMUCOSAL LIFTING INJECTION;  Surgeon: Irving Copas., MD;  Location: [facility];  Service: Gastroenterology;;   SUBMUCOSAL TATTOO INJECTION  10/23/2020   Procedure: SUBMUCOSAL TATTOO INJECTION;  Surgeon: Irving Copas., MD;  Location: [facility];  Service: Gastroenterology;;   TOTAL ABDOMINAL HYSTERECTOMY  1987   ULTRASOUND GUIDANCE FOR VASCULAR ACCESS Bilateral 02/09/2022   Procedure: ULTRASOUND GUIDANCE FOR VASCULAR ACCESS LEFT COMMON FEMORAL ARTERY;  Surgeon: [name], MD;  Location: [place];  Service: Vascular;  Laterality: Bilateral;    Current Medications,  Allergies, Family History and Social History were reviewed in [employer] record.     Current Outpatient Medications  Medication Sig Dispense Refill   acetaminophen (TYLENOL) 500 MG tablet Take 500-1,000 mg by mouth every 6 (six) hours as needed for moderate pain.     albuterol (VENTOLIN HFA) 108 (90 Base) MCG/ACT inhaler Inhale 1-2 puffs into the lungs 4 (four) times daily as needed for shortness of breath or wheezing.     aspirin EC 81 MG EC tablet Take 1 tablet (81 mg total) by mouth daily. Swallow whole. 30 tablet 11   carboxymethylcellulose (REFRESH PLUS) 0.5 % SOLN Place 1 drop into both eyes 2 (two) times daily as needed (dry/irritated eyes.).     clopidogrel (PLAVIX) 75 MG tablet Take 75 mg by mouth daily.     diazepam (VALIUM) 5 MG tablet Take 5 mg by mouth 3 (three) times daily as needed for anxiety.     diphenhydramine-acetaminophen (TYLENOL PM) 25-500 MG TABS tablet Take 1-2 tablets by mouth at bedtime as  needed (sleep / pain).     docusate sodium (COLACE) 100 MG capsule Take 100-200 mg by mouth at bedtime as needed for mild constipation.     famotidine (PEPCID) 40 MG tablet Take 1 tablet by mouth as needed.     gabapentin (NEURONTIN) 100 MG capsule Take 100 mg by mouth 2 (two) times daily.     lisinopril (ZESTRIL) 20 MG tablet Take 20 mg by mouth daily.     melatonin 3 MG TABS tablet Take 3-6 mg by mouth at bedtime as needed (sleep).     mometasone (NASONEX) 50 MCG/ACT nasal spray Place 2 sprays into the nose daily as needed (allergies).     montelukast (SINGULAIR) 10 MG tablet Take 10 mg by mouth at bedtime.     Multiple Vitamin (MULTI-VITAMIN) tablet Take 1 tablet by mouth daily.     Omega-3 Fatty Acids (FISH OIL ULTRA) 1400 MG CAPS Take 1,400 mg by mouth daily.     pantoprazole (PROTONIX) 20 MG tablet Take 20 mg by mouth daily before breakfast.     polycarbophil (FIBERCON) 625 MG tablet Take 625 mg by mouth in the morning and at bedtime.     rosuvastatin (CRESTOR) 20 MG tablet Take 20 mg by mouth daily.     No current facility-administered medications for this visit.    Review of Systems: No chest pain. No shortness of breath. No urinary complaints.    Physical Exam  Wt Readings from Last 3 Encounters:  11/03/22 122 lb (55.3 kg)  06/23/22 124 lb 4 oz (56.4 kg)  03/03/22 126 lb 6.4 oz (57.3 kg)    BP 110/64   Pulse 75   Ht 5\' 1"  (1.549 m)   Wt 119 lb (54 kg)   BMI 22.48 kg/m  Constitutional:  Pleasant, generally well appearing female in no acute distress. Psychiatric: Normal mood and affect. Behavior is normal. EENT: Pupils normal.  Conjunctivae are normal. No scleral icterus. Neck supple.  Cardiovascular: Normal rate, regular rhythm.  Pulmonary/chest: Effort normal and breath sounds normal. No wheezing, rales or rhonchi. Abdominal: Soft, nondistended, nontender. Bowel sounds active throughout. There are no masses palpable. No hepatomegaly. Neurological: Alert and oriented  to person place and time.  Skin: Skin is warm and dry. No rashes noted.  Tye Savoy, NP  01/19/2023, 9:01 AM

## 2023-01-20 NOTE — Progress Notes (Signed)
Attending Physician's Attestation   I have reviewed the chart.   I agree with the Advanced Practitioner's note, impression, and recommendations with any updates as below. I am okay to move forward with EGD and possible CT scan for further evaluation.  I am comfortable with keeping the patient on Plavix, if there are any issues from a vascular surgery standpoint about holding it.  I would not be offering a dilation at that time but should be able to take biopsies if anything else is abnormal.   Justice Britain, MD [facility] Advanced Endoscopy Office # [phone]

## 2023-01-21 ENCOUNTER — Telehealth: Payer: Self-pay

## 2023-01-21 NOTE — Telephone Encounter (Signed)
   Brandi Mcmahon 1941/03/15 [id number]  Dear Dr Carlis Abbott,  We have scheduled the above named patient for an endoscopy procedure. Our records show that she is on antiplatelet therapy.  Please advise as to whether the patient may come off their therapy of Plavix 5 days prior to their procedure which is scheduled for 02-10-23.  Please route your response to Harrel Lemon, CMA or fax response to [phone].  Sincerely,    [facility]

## 2023-01-25 NOTE — Telephone Encounter (Signed)
Patient advised that she  has been given clearance to hold Plavix 5 days prior to EGD scheduled for 02-10-23.  Patient advised to take last dose of Plavix on 02-04-23, and she will be advised when to restart Plavix by Dr Rush Landmark after the procedure.  Patient agreed to plan and verbalized understanding.  No further questions.

## 2023-02-02 ENCOUNTER — Ambulatory Visit: Payer: Medicare Other | Admitting: Vascular Surgery

## 2023-02-10 ENCOUNTER — Ambulatory Visit (AMBULATORY_SURGERY_CENTER): Payer: Medicare Other | Admitting: Gastroenterology

## 2023-02-10 ENCOUNTER — Encounter: Payer: Self-pay | Admitting: Gastroenterology

## 2023-02-10 VITALS — BP 129/66 | HR 76 | Temp 97.5°F | Resp 13 | Ht 61.0 in | Wt 119.0 lb

## 2023-02-10 DIAGNOSIS — R1084 Generalized abdominal pain: Secondary | ICD-10-CM | POA: Diagnosis not present

## 2023-02-10 DIAGNOSIS — K229 Disease of esophagus, unspecified: Secondary | ICD-10-CM

## 2023-02-10 DIAGNOSIS — K2950 Unspecified chronic gastritis without bleeding: Secondary | ICD-10-CM | POA: Diagnosis not present

## 2023-02-10 DIAGNOSIS — K2980 Duodenitis without bleeding: Secondary | ICD-10-CM | POA: Diagnosis present

## 2023-02-10 DIAGNOSIS — R634 Abnormal weight loss: Secondary | ICD-10-CM

## 2023-02-10 MED ORDER — SODIUM CHLORIDE 0.9 % IV SOLN: 500.0000 mL | Freq: Once | INTRAVENOUS | Status: DC

## 2023-02-10 MED ORDER — PANTOPRAZOLE SODIUM 40 MG PO TBEC
40.0000 mg | DELAYED_RELEASE_TABLET | Freq: Every day | ORAL | 6 refills | Status: DC
Start: 2023-02-10 — End: 2023-05-31

## 2023-02-10 MED ORDER — SUCRALFATE 1 G PO TABS: 1.0000 g | ORAL_TABLET | Freq: Two times a day (BID) | ORAL | 6 refills | Status: DC

## 2023-02-10 NOTE — Progress Notes (Unsigned)
Sedate, gd SR, tolerated procedure well, VSS, report to RN 

## 2023-02-10 NOTE — Progress Notes (Unsigned)
GASTROENTEROLOGY PROCEDURE H&P NOTE   Primary Care Physician: Joya Salm  HPI: Brandi Mcmahon is a 82 y.o. female who presents for EGD for evaluation of abdominal pain and unintentional weight loss.  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   GERD (gastroesophageal reflux disease)    Hyperlipidemia    Hypertension    Shingles    Past Surgical History:  Procedure Laterality Date   AORTOGRAM N/A 02/09/2022   Procedure: AORTOGRAM WITH ILIAC ARTERIOGRAM;  Surgeon: Cephus Shelling, MD;  Location: [facility] OR;  Service: Vascular;  Laterality: N/A;   BREAST LUMPECTOMY Left 11/07/2009   for left DCIS   BUNIONECTOMY  2009   CARDIAC CATHETERIZATION     ~ 2004, reported results unremarkable and shoulder symptoms due to bursitis   COLONOSCOPY  2019   COLONOSCOPY WITH PROPOFOL N/A 10/23/2020   Procedure: COLONOSCOPY WITH PROPOFOL;  Surgeon: Lemar Lofty., MD;  Location: [facility];  Service: Gastroenterology;  Laterality: N/A;   ENDARTERECTOMY FEMORAL Right 02/09/2022   Procedure: RIGHT COMMON FEMORAL ENDARTERECTOMY WITH PROFUNDA PLASTY AND SUPERFICIAL FEMORAL ARTERY ENDARTERECTOMY;  Surgeon: Cephus Shelling, MD;  Location: [facility] OR;  Service: Vascular;  Laterality: Right;   HEMOSTASIS CLIP PLACEMENT  10/23/2020   Procedure: HEMOSTASIS CLIP PLACEMENT;  Surgeon: Lemar Lofty., MD;  Location: [facility];  Service: Gastroenterology;;   INSERTION OF ILIAC STENT Bilateral 02/09/2022   Procedure: BILATERAL COMMON ILIAC ANGIOPLASTY WITH STENTS;  Surgeon: Cephus Shelling, MD;  Location: [facility] OR;  Service: Vascular;  Laterality: Bilateral;   INTRAOPERATIVE ARTERIOGRAM Right 02/09/2022   Procedure: RIGHT LOWER EXTREMITY ARTERIOGRAM;  Surgeon: Cephus Shelling, MD;  Location: [facility] OR;  Service: Vascular;  Laterality: Right;   LUMBAR  LAMINECTOMY/DECOMPRESSION MICRODISCECTOMY Right 06/18/2021   Procedure: Laminectomy and Foraminotomy - right - Lumbar four-Lumbar five for resection of synovial cyst;  Surgeon: Donalee Citrin, MD;  Location: [facility] OR;  Service: Neurosurgery;  Laterality: Right;   PATCH ANGIOPLASTY Right 02/09/2022   Procedure: PATCH ANGIOPLASTY USING XENOSURE 1cm x 14cm BIOLOGIC PATCH;  Surgeon: Cephus Shelling, MD;  Location: [facility] OR;  Service: Vascular;  Laterality: Right;   POLYPECTOMY  10/23/2020   Procedure: POLYPECTOMY;  Surgeon: Meridee Score Netty Starring., MD;  Location: [facility];  Service: Gastroenterology;;   SUBMUCOSAL LIFTING INJECTION  10/23/2020   Procedure: SUBMUCOSAL LIFTING INJECTION;  Surgeon: Lemar Lofty., MD;  Location: [facility];  Service: Gastroenterology;;   SUBMUCOSAL TATTOO INJECTION  10/23/2020   Procedure: SUBMUCOSAL TATTOO INJECTION;  Surgeon: Lemar Lofty., MD;  Location: [facility];  Service: Gastroenterology;;   TOTAL ABDOMINAL HYSTERECTOMY  1987   ULTRASOUND GUIDANCE FOR VASCULAR ACCESS Bilateral 02/09/2022   Procedure: ULTRASOUND GUIDANCE FOR VASCULAR ACCESS LEFT COMMON FEMORAL ARTERY;  Surgeon: Cephus Shelling, MD;  Location: [facility] OR;  Service: Vascular;  Laterality: Bilateral;   Current Outpatient Medications  Medication Sig Dispense Refill   acetaminophen (TYLENOL) 500 MG tablet Take 500-1,000 mg by mouth every 6 (six) hours as needed for moderate pain.     albuterol (VENTOLIN HFA) 108 (90 Base) MCG/ACT inhaler Inhale 1-2 puffs into the lungs 4 (four) times daily as needed for shortness of breath or wheezing.     Cetirizine HCl (ZYRTEC ALLERGY) 10 MG CAPS Take 10 mg by mouth daily.     diazepam (VALIUM)  5 MG tablet Take 5 mg by mouth 3 (three) times daily as needed for anxiety.     docusate sodium (COLACE) 100 MG capsule Take 100-200 mg by mouth at bedtime as needed for mild constipation.     lisinopril (ZESTRIL) 20 MG tablet Take 20 mg by mouth daily.      mometasone (NASONEX) 50 MCG/ACT nasal spray Place 2 sprays into the nose daily as needed (allergies).     montelukast (SINGULAIR) 10 MG tablet Take 10 mg by mouth at bedtime.     MYRBETRIQ 50 MG TB24 tablet Take 50 mg by mouth daily.     rosuvastatin (CRESTOR) 20 MG tablet Take 20 mg by mouth daily.     aspirin EC 81 MG EC tablet Take 1 tablet (81 mg total) by mouth daily. Swallow whole. 30 tablet 11   clopidogrel (PLAVIX) 75 MG tablet Take 75 mg by mouth daily.     diphenhydramine-acetaminophen (TYLENOL PM) 25-500 MG TABS tablet Take 1-2 tablets by mouth at bedtime as needed (sleep / pain). (Patient not taking: Reported on 01/19/2023)     famotidine (PEPCID) 40 MG tablet Take 1 tablet by mouth as needed. (Patient not taking: Reported on 01/19/2023)     gabapentin (NEURONTIN) 100 MG capsule Take 100 mg by mouth 2 (two) times daily. (Patient not taking: Reported on 01/19/2023)     melatonin 3 MG TABS tablet Take 3-6 mg by mouth at bedtime as needed (sleep). (Patient not taking: Reported on 02/10/2023)     Multiple Vitamin (MULTI-VITAMIN) tablet Take 1 tablet by mouth daily. (Patient not taking: Reported on 01/19/2023)     Omega-3 Fatty Acids (FISH OIL ULTRA) 1400 MG CAPS Take 1,400 mg by mouth daily. (Patient not taking: Reported on 01/19/2023)     pantoprazole (PROTONIX) 20 MG tablet Take 20 mg by mouth daily before breakfast. (Patient not taking: Reported on 01/19/2023)     polycarbophil (FIBERCON) 625 MG tablet Take 625 mg by mouth in the morning and at bedtime. (Patient not taking: Reported on 01/19/2023)     Current Facility-Administered Medications  Medication Dose Route Frequency Provider Last Rate Last Admin   0.9 %  sodium chloride infusion  500 mL Intravenous Once Mansouraty, Netty Starring., MD        Current Outpatient Medications:    acetaminophen (TYLENOL) 500 MG tablet, Take 500-1,000 mg by mouth every 6 (six) hours as needed for moderate pain., Disp: , Rfl:    albuterol (VENTOLIN HFA) 108  (90 Base) MCG/ACT inhaler, Inhale 1-2 puffs into the lungs 4 (four) times daily as needed for shortness of breath or wheezing., Disp: , Rfl:    Cetirizine HCl (ZYRTEC ALLERGY) 10 MG CAPS, Take 10 mg by mouth daily., Disp: , Rfl:    diazepam (VALIUM) 5 MG tablet, Take 5 mg by mouth 3 (three) times daily as needed for anxiety., Disp: , Rfl:    docusate sodium (COLACE) 100 MG capsule, Take 100-200 mg by mouth at bedtime as needed for mild constipation., Disp: , Rfl:    lisinopril (ZESTRIL) 20 MG tablet, Take 20 mg by mouth daily., Disp: , Rfl:    mometasone (NASONEX) 50 MCG/ACT nasal spray, Place 2 sprays into the nose daily as needed (allergies)., Disp: , Rfl:    montelukast (SINGULAIR) 10 MG tablet, Take 10 mg by mouth at bedtime., Disp: , Rfl:    MYRBETRIQ 50 MG TB24 tablet, Take 50 mg by mouth daily., Disp: , Rfl:    rosuvastatin (CRESTOR) 20  MG tablet, Take 20 mg by mouth daily., Disp: , Rfl:    aspirin EC 81 MG EC tablet, Take 1 tablet (81 mg total) by mouth daily. Swallow whole., Disp: 30 tablet, Rfl: 11   clopidogrel (PLAVIX) 75 MG tablet, Take 75 mg by mouth daily., Disp: , Rfl:    diphenhydramine-acetaminophen (TYLENOL PM) 25-500 MG TABS tablet, Take 1-2 tablets by mouth at bedtime as needed (sleep / pain). (Patient not taking: Reported on 01/19/2023), Disp: , Rfl:    famotidine (PEPCID) 40 MG tablet, Take 1 tablet by mouth as needed. (Patient not taking: Reported on 01/19/2023), Disp: , Rfl:    gabapentin (NEURONTIN) 100 MG capsule, Take 100 mg by mouth 2 (two) times daily. (Patient not taking: Reported on 01/19/2023), Disp: , Rfl:    melatonin 3 MG TABS tablet, Take 3-6 mg by mouth at bedtime as needed (sleep). (Patient not taking: Reported on 02/10/2023), Disp: , Rfl:    Multiple Vitamin (MULTI-VITAMIN) tablet, Take 1 tablet by mouth daily. (Patient not taking: Reported on 01/19/2023), Disp: , Rfl:    Omega-3 Fatty Acids (FISH OIL ULTRA) 1400 MG CAPS, Take 1,400 mg by mouth daily. (Patient not  taking: Reported on 01/19/2023), Disp: , Rfl:    pantoprazole (PROTONIX) 20 MG tablet, Take 20 mg by mouth daily before breakfast. (Patient not taking: Reported on 01/19/2023), Disp: , Rfl:    polycarbophil (FIBERCON) 625 MG tablet, Take 625 mg by mouth in the morning and at bedtime. (Patient not taking: Reported on 01/19/2023), Disp: , Rfl:   Current Facility-Administered Medications:    0.9 %  sodium chloride infusion, 500 mL, Intravenous, Once, Mansouraty, Netty Starring., MD Allergies  Allergen Reactions   Augmentin [Amoxicillin-Pot Clavulanate] Other (See Comments)    ABDONIMAL PAIN    Clavulanic Acid Hives   Codeine Hives and Other (See Comments)   Alprazolam Other (See Comments)    OVER SEDATES    Bee Venom Hives   Sulfa Antibiotics Rash and Other (See Comments)   Tramadol     Insomnia    Avelox [Moxifloxacin] Rash   Hydrocodone-Acetaminophen Other (See Comments)    Insomnia    Family History  Problem Relation Age of Onset   Breast cancer Mother    Lung cancer Father    Colon cancer Neg Hx    Colon polyps Neg Hx    Esophageal cancer Neg Hx    Rectal cancer Neg Hx    Stomach cancer Neg Hx    Inflammatory bowel disease Neg Hx    Liver disease Neg Hx    Pancreatic cancer Neg Hx    Social History   Socioeconomic History   Marital status: Married    Spouse name: Not on file   Number of children: Not on file   Years of education: Not on file   Highest education level: Not on file  Occupational History   Not on file  Tobacco Use   Smoking status: Some Days    Packs/day: .25    Types: Cigarettes   Smokeless tobacco: Never   Tobacco comments:    1-2 per day, trying to quit  Vaping Use   Vaping Use: Never used  Substance and Sexual Activity   Alcohol use: Not Currently   Drug use: Never   Sexual activity: Not on file  Other Topics Concern   Not on file  Social History Narrative   Not on file   Social Determinants of Health   Financial Resource Strain: Not on  file  Food Insecurity: Not on file  Transportation Needs: Not on file  Physical Activity: Not on file  Stress: Not on file  Social Connections: Not on file  Intimate Partner Violence: Not on file    Physical Exam: Today's Vitals   02/10/23 1308 02/10/23 1310  BP: 136/61   Pulse: 75   Temp: (!) 97.5 F (36.4 C) (!) 97.5 F (36.4 C)  TempSrc: Other (Comment)   SpO2: 98%   Weight: 119 lb (54 kg)   Height:  (1.549 m)   PainSc:  3    Body mass index is 22.48 kg/m. GEN: NAD EYE: Sclerae anicteric ENT: MMM CV: Non-tachycardic GI: Soft, NT/ND NEURO:  Alert & Oriented x 3  Lab Results: No results for input(s): "WBC", "HGB", "HCT", "PLT" in the last 72 hours. BMET No results for input(s): "NA", "K", "CL", "CO2", "GLUCOSE", "BUN", "CREATININE", "CALCIUM" in the last 72 hours. LFT No results for input(s): "PROT", "ALBUMIN", "AST", "ALT", "ALKPHOS", "BILITOT", "BILIDIR", "IBILI" in the last 72 hours. PT/INR No results for input(s): "LABPROT", "INR" in the last 72 hours.   Impression / Plan: This is a 82 y.o.female who presents for EGD for evaluation of abdominal pain and unintentional weight loss.  The risks and benefits of endoscopic evaluation/treatment were discussed with the patient and/or family; these include but are not limited to the risk of perforation, infection, bleeding, missed lesions, lack of diagnosis, severe illness requiring hospitalization, as well as anesthesia and sedation related illnesses.  The patient's history has been reviewed, patient examined, no change in status, and deemed stable for procedure.  The patient and/or family is agreeable to proceed.    [name], MD [facility] Office # [phone]

## 2023-02-10 NOTE — Patient Instructions (Addendum)
-Restart Plavix on 02/11/2023 at prior dose  - Resume previous diet. - Initiate PPI 40 mg once daily (take 30 minutes  before breakfast or dinner). Try the Pantoprazole for one week before starting the Carafate.  - Continue present medications. - Await pathology results. - Consider solid food gastric emptying study if patient continues to have symptoms of early satiety. - Recommend consideration of colonoscopy for  follow-up of previous large hyperplastic left-sided colon polyp resection and for unintentional weight loss if this proves persists or progresses.  YOU HAD AN ENDOSCOPIC PROCEDURE TODAY AT THE [facility]:   Refer to the procedure report that was given to you for any specific questions about what was found during the examination.  If the procedure report does not answer your questions, please call your gastroenterologist to clarify.  If you requested that your care partner not be given the details of your procedure findings, then the procedure report has been included in a sealed envelope for you to review at your convenience later.  YOU SHOULD EXPECT: Some feelings of bloating in the abdomen. Passage of more gas than usual.  Walking can help get rid of the air that was put into your GI tract during the procedure and reduce the bloating. If you had a lower endoscopy (such as a colonoscopy or flexible sigmoidoscopy) you may notice spotting of blood in your stool or on the toilet paper. If you underwent a bowel prep for your procedure, you may not have a normal bowel movement for a few days.  Please Note:  You might notice some irritation and congestion in your nose or some drainage.  This is from the oxygen used during your procedure.  There is no need for concern and it should clear up in a day or so.  SYMPTOMS TO REPORT IMMEDIATELY:  Following upper endoscopy (EGD)  Vomiting of blood or coffee ground material  New chest pain or pain under the shoulder blades  Painful or  persistently difficult swallowing  New shortness of breath  Fever of 100F or higher  Black, tarry-looking stools  For urgent or emergent issues, a gastroenterologist can be reached at any hour by calling [phone]. Do not use MyChart messaging for urgent concerns.    DIET:  We do recommend a small meal at first, but then you may proceed to your regular diet.  Drink plenty of fluids but you should avoid alcoholic beverages for 24 hours.  ACTIVITY:  You should plan to take it easy for the rest of today and you should NOT DRIVE or use heavy machinery until tomorrow (because of the sedation medicines used during the test).    FOLLOW UP: Our staff will call the number listed on your records the next business day following your procedure.  We will call around 7:15- 8:00 am to check on you and address any questions or concerns that you may have regarding the information given to you following your procedure. If we do not reach you, we will leave a message.     If any biopsies were taken you will be contacted by phone or by letter within the next 1-3 weeks.  Please call us at [phone] if you have not heard about the biopsies in 3 weeks.    SIGNATURES/CONFIDENTIALITY: You and/or your care partner have signed paperwork which will be entered into your electronic medical record.  These signatures attest to the fact that that the information above on your After Visit Summary has been  reviewed and is understood.  Full responsibility of the confidentiality of this discharge information lies with you and/or your care-partner.

## 2023-02-10 NOTE — Progress Notes (Signed)
Called to room to assist during endoscopic procedure.  Patient ID and intended procedure confirmed with present staff. Received instructions for my participation in the procedure from the performing physician.  

## 2023-02-10 NOTE — Op Note (Signed)
[facility] Patient Name: Brandi Mcmahon Procedure Date: 02/10/2023 2:17 PM MRN: [id number] Endoscopist: [name] , MD, [id number] Age: 82 Referring MD:  Date of Birth: [birth year] Gender: Female Account #: [id number] Procedure:                Upper GI endoscopy Indications:              Epigastric abdominal pain, Early satiety, Weight                            loss Medicines:                Monitored Anesthesia Care Procedure:                Pre-Anesthesia Assessment:                           - Prior to the procedure, a History and Physical                            was performed, and patient medications and                            allergies were reviewed. The patient's tolerance of                            previous anesthesia was also reviewed. The risks                            and benefits of the procedure and the sedation                            options and risks were discussed with the patient.                            All questions were answered, and informed consent                            was obtained. Prior Anticoagulants: The patient                            last took Plavix (clopidogrel) 5 days prior to the                            procedure and has taken no anticoagulant or                            antiplatelet agents except for aspirin. ASA Grade                            Assessment: III - A patient with severe systemic                            disease. After reviewing the risks and benefits,  the patient was deemed in satisfactory condition to                            undergo the procedure.                           After obtaining informed consent, the endoscope was                            passed under direct vision. Throughout the                            procedure, the patient's blood pressure, pulse, and                            oxygen saturations were monitored continuously. The                             Olympus Scope [id number] was introduced through the                            mouth, and advanced to the second part of duodenum.                            The upper GI endoscopy was accomplished without                            difficulty. The patient tolerated the procedure. Scope In: Scope Out: Findings:                 No gross lesions were noted in the entire esophagus.                           The Z-line was irregular and was found 37 cm from                            the incisors.                           A 2 cm hiatal hernia was present.                           Patchy mild inflammation characterized by erosions,                            erythema and friability was found in the entire                            examined stomach. Biopsies were taken with a cold                            forceps for histology and Helicobacter pylori                            testing.  Patchy mildly erythematous mucosa without active                            bleeding and with no stigmata of bleeding was found                            in the duodenal bulb, in the first portion of the                            duodenum and in the second portion of the duodenum.                            Biopsies were taken with a cold forceps for                            histology. Complications:            No immediate complications. Estimated Blood Loss:     Estimated blood loss was minimal. Impression:               - No gross lesions in the entire esophagus. Z-line                            irregular, 37 cm from the incisors.                           - 2 cm hiatal hernia.                           - Gastritis. Biopsied.                           - Erythematous duodenopathy. Biopsied. Recommendation:           - The patient will be observed post-procedure,                            until all discharge criteria are met.                           -  Discharge patient to home.                           - Patient has a contact number available for                            emergencies. The signs and symptoms of potential                            delayed complications were discussed with the                            patient. Return to normal activities tomorrow.                            Written discharge instructions were  provided to the                            patient.                           - Resume previous diet.                           - Initiate PPI 40 mg once daily (take 30 minutes                            before breakfast or dinner).                           - May restart Plavix tomorrow on 4/18.                           - Continue present medications.                           - Await pathology results.                           - Consider solid food gastric emptying study if                            patient continues to have symptoms of early satiety.                           - Recommend consideration of colonoscopy for                            follow-up of previous large hyperplastic left-sided                            colon polyp resection and for unintentional weight                            loss if this proves persists or progresses.                           - The findings and recommendations were discussed                            with the patient.                           - The findings and recommendations were discussed                            with the patient's family. [name], MD 02/10/2023 2:35:37 PM

## 2023-02-11 ENCOUNTER — Telehealth: Payer: Self-pay

## 2023-02-11 NOTE — Telephone Encounter (Signed)
  Follow up Call-     02/10/2023    1:10 PM 06/18/2020    8:01 AM  Call back number  Post procedure Call Back phone  # [phone]  Permission to leave phone message Yes Yes     Patient questions:  Do you have a fever, pain , or abdominal swelling? No. Pain Score  0 *  Have you tolerated food without any problems? Yes.    Have you been able to return to your normal activities? Yes.    Do you have any questions about your discharge instructions: Diet   No. Medications  No. Follow up visit  No.  Do you have questions or concerns about your Care? No.  Actions: * If pain score is 4 or above: No action needed, pain <4.

## 2023-02-15 ENCOUNTER — Encounter: Payer: Self-pay | Admitting: Gastroenterology

## 2023-02-16 ENCOUNTER — Other Ambulatory Visit: Payer: Self-pay

## 2023-02-16 DIAGNOSIS — R6881 Early satiety: Secondary | ICD-10-CM

## 2023-02-16 DIAGNOSIS — R634 Abnormal weight loss: Secondary | ICD-10-CM

## 2023-02-16 DIAGNOSIS — R1084 Generalized abdominal pain: Secondary | ICD-10-CM

## 2023-03-02 ENCOUNTER — Ambulatory Visit: Payer: Medicare Other | Admitting: Vascular Surgery

## 2023-03-09 ENCOUNTER — Ambulatory Visit: Payer: Medicare Other | Admitting: Vascular Surgery

## 2023-03-12 ENCOUNTER — Telehealth: Payer: Self-pay | Admitting: Gastroenterology

## 2023-03-12 NOTE — Telephone Encounter (Signed)
Inbound call from [facility] radiology stating patient has an upcoming PET scan for Monday but our facility MPI number is rejecting with patients insurance. Please f/u ASAP.  Thank you

## 2023-03-12 NOTE — Telephone Encounter (Signed)
Patient calling back please advise asap.

## 2023-03-15 ENCOUNTER — Encounter (HOSPITAL_COMMUNITY): Payer: Medicare Other

## 2023-03-15 ENCOUNTER — Encounter (HOSPITAL_COMMUNITY)
Admission: RE | Admit: 2023-03-15 | Discharge: 2023-03-15 | Disposition: A | Discharge status: Home / Self Care | Payer: Medicare Other | Source: Ambulatory Visit | Attending: Gastroenterology | Admitting: Gastroenterology

## 2023-03-15 DIAGNOSIS — R6881 Early satiety: Secondary | ICD-10-CM | POA: Diagnosis present

## 2023-03-15 DIAGNOSIS — R634 Abnormal weight loss: Secondary | ICD-10-CM

## 2023-03-15 DIAGNOSIS — R1084 Generalized abdominal pain: Secondary | ICD-10-CM

## 2023-03-15 MED ORDER — TECHNETIUM TC 99M SULFUR COLLOID FILTERED
2.4600 | Freq: Once | INTRAVENOUS | Status: AC | PRN
  Administered 2023-03-15: 2.46 via INTRADERMAL

## 2023-03-18 ENCOUNTER — Other Ambulatory Visit (HOSPITAL_COMMUNITY): Payer: Medicare Other

## 2023-03-30 ENCOUNTER — Ambulatory Visit: Payer: Medicare Other | Admitting: Vascular Surgery

## 2023-04-19 ENCOUNTER — Encounter: Payer: Self-pay | Admitting: Nurse Practitioner

## 2023-04-19 ENCOUNTER — Ambulatory Visit: Payer: Medicare Other | Admitting: Nurse Practitioner

## 2023-04-19 VITALS — BP 110/66 | HR 78 | Ht 61.0 in | Wt 127.0 lb

## 2023-04-19 DIAGNOSIS — K2970 Gastritis, unspecified, without bleeding: Secondary | ICD-10-CM | POA: Diagnosis not present

## 2023-04-19 NOTE — Progress Notes (Signed)
Primary GI: Brandi Parish, MD  Assessment and Plan   Brief Narrative:  82 y.o.  female whose past medical history includes,  but is not necessarily limited to, PAD on Plavix, hypertension, hyperlipidemia, GERD, large hyperplastic polyp resected in 2021.    Weight loss, early satiety, postprandial abdominal pain.  Symptoms have resolved.  Recent EGD showed gastritis and chronic peptic duodenitis.   Her symptoms have resolved, possibly due to starting pantoprazole. She has been able to gain some of her weight back ( up 8 pounds since late March).  Gastric emptying study was normal -Continue daily pantoprazole -No need to take Carafate or famotidine (she has not been taking these anyway).    Large hyperplastic polyp , resected in 2021.  She is not interested in a follow-up colonoscopy   History of Present Illness   Chief complaint:  follow up on weight loss, abdominal pain   I saw Brandi Mcmahon 01/19/2023 for early satiety, weight loss and postprandial abdominal pain.  She underwent an EGD with findings of non-H. pylori and chronic peptic duodenitis . She was started on a PPI.  She underwent a gastric emptying study which was normal  Patient here for follow-up.  She has gained about 8 pounds since I saw her in March.  Her appetite is great. She isn't having any abdominal pain.  She is taking the pantoprazole but not taking Carafate ( doesn't think she needs it). Pepcid on home med list but she isn't taking it either.  She thinks her weight loss may have been due to recent surgeries as well as stress.  Her husband has been diagnosed with dementia and she is his caretaker  Patient is not interested in a colonoscopy to follow-up on the large hyperplastic polyp she had resected in 2021.  She has not had any blood in her stool   Previous GI Endoscopies / Labs / Imaging  02/10/23 EGD for weight loss, abdominal pain  2 cm hiatal hernia.  Gastritis.  Biopsied.  Erythematous  duodenopathy.  Diagnosis 1. Surgical [P], gastric - ANTRAL AND OXYNTIC MUCOSA WITH NO SIGNIFICANT PATHOLOGY. - NO HELICOBACTER PYLORI ORGANISMS IDENTIFIED ON H&E STAINED SLIDE. 2. Surgical [P], duodenal - DUODENAL MUCOSA WITH PROMINENT BRUNNER'S GLANDS AND EXTENSIVE FOVEOLAR METAPLASIA AND ASSOCIATED REACTIVE/REPARATIVE CHANGE CONSISTENT WITH CHRONIC PEPTIC DUODENITIS.      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   GERD (gastroesophageal reflux disease)    Hyperlipidemia    Hypertension    Shingles     Past Surgical History:  Procedure Laterality Date   AORTOGRAM N/A 02/09/2022   Procedure: AORTOGRAM WITH ILIAC ARTERIOGRAM;  Surgeon: Cephus Shelling, MD;  Location: [facility] OR;  Service: Vascular;  Laterality: N/A;   BREAST LUMPECTOMY Left 11/07/2009   for left DCIS   BUNIONECTOMY  2009   CARDIAC CATHETERIZATION     ~ 2004, reported results unremarkable and shoulder symptoms due  to bursitis   COLONOSCOPY  2019   COLONOSCOPY WITH PROPOFOL N/A 10/23/2020   Procedure: COLONOSCOPY WITH PROPOFOL;  Surgeon: Meridee Score Netty Starring., MD;  Location: [facility]OSCOPY;  Service: Gastroenterology;  Laterality: N/A;   ENDARTERECTOMY FEMORAL Right 02/09/2022   Procedure: RIGHT COMMON FEMORAL ENDARTERECTOMY WITH PROFUNDA PLASTY AND SUPERFICIAL FEMORAL ARTERY ENDARTERECTOMY;  Surgeon: Cephus Shelling, MD;  Location: [facility] OR;  Service: Vascular;  Laterality: Right;   HEMOSTASIS CLIP PLACEMENT  10/23/2020   Procedure: HEMOSTASIS CLIP PLACEMENT;  Surgeon: Lemar Lofty., MD;  Location: [facility];  Service:  Gastroenterology;;   INSERTION OF ILIAC STENT Bilateral 02/09/2022   Procedure: BILATERAL COMMON ILIAC ANGIOPLASTY WITH STENTS;  Surgeon: Cephus Shelling, MD;  Location: [facility] OR;  Service: Vascular;  Laterality: Bilateral;   INTRAOPERATIVE ARTERIOGRAM Right 02/09/2022   Procedure: RIGHT LOWER EXTREMITY ARTERIOGRAM;  Surgeon: Cephus Shelling, MD;  Location: [facility] OR;  Service: Vascular;  Laterality: Right;   LUMBAR LAMINECTOMY/DECOMPRESSION MICRODISCECTOMY Right 06/18/2021   Procedure: Laminectomy and Foraminotomy - right - Lumbar four-Lumbar five for resection of synovial cyst;  Surgeon: Donalee Citrin, MD;  Location: [facility] OR;  Service: Neurosurgery;  Laterality: Right;   PATCH ANGIOPLASTY Right 02/09/2022   Procedure: PATCH ANGIOPLASTY USING XENOSURE 1cm x 14cm BIOLOGIC PATCH;  Surgeon: Cephus Shelling, MD;  Location: [facility] OR;  Service: Vascular;  Laterality: Right;   POLYPECTOMY  10/23/2020   Procedure: POLYPECTOMY;  Surgeon: Meridee Score Netty Starring., MD;  Location: [facility];  Service: Gastroenterology;;   SUBMUCOSAL LIFTING INJECTION  10/23/2020   Procedure: SUBMUCOSAL LIFTING INJECTION;  Surgeon: Lemar Lofty., MD;  Location: [facility];  Service: Gastroenterology;;   SUBMUCOSAL TATTOO INJECTION  10/23/2020   Procedure: SUBMUCOSAL TATTOO INJECTION;  Surgeon: Lemar Lofty., MD;  Location: [facility];  Service: Gastroenterology;;   TOTAL ABDOMINAL HYSTERECTOMY  1987   ULTRASOUND GUIDANCE FOR VASCULAR ACCESS Bilateral 02/09/2022   Procedure: ULTRASOUND GUIDANCE FOR VASCULAR ACCESS LEFT COMMON FEMORAL ARTERY;  Surgeon: Cephus Shelling, MD;  Location: [facility] OR;  Service: Vascular;  Laterality: Bilateral;    Current Medications, Allergies, Family History and Social History were reviewed in [employer] medical record.     Current Outpatient Medications  Medication Sig Dispense Refill   acetaminophen (TYLENOL) 500 MG tablet Take 500-1,000 mg by mouth  every 6 (six) hours as needed for moderate pain.     albuterol (VENTOLIN HFA) 108 (90 Base) MCG/ACT inhaler Inhale 1-2 puffs into the lungs 4 (four) times daily as needed for shortness of breath or wheezing.     aspirin EC 81 MG EC tablet Take 1 tablet (81 mg total) by mouth daily. Swallow whole. 30 tablet 11   Cetirizine HCl (ZYRTEC ALLERGY) 10 MG CAPS Take 10 mg by mouth daily.     clopidogrel (PLAVIX) 75 MG tablet Take 75 mg by mouth daily.     diazepam (VALIUM) 5 MG tablet Take 5 mg by mouth 3 (three) times daily as needed for anxiety.     docusate sodium (COLACE) 100 MG capsule Take 100-200 mg by mouth at bedtime as needed for mild constipation.     gabapentin (NEURONTIN) 100 MG capsule Take 100 mg by mouth 2 (two) times daily.     lisinopril (ZESTRIL) 20 MG tablet Take 20 mg by mouth daily.     mometasone (NASONEX) 50 MCG/ACT nasal spray Place 2 sprays into the nose daily as needed (allergies).     montelukast (SINGULAIR) 10 MG tablet Take 10 mg by  mouth at bedtime.     MYRBETRIQ 50 MG TB24 tablet Take 50 mg by mouth daily.     pantoprazole (PROTONIX) 40 MG tablet Take 1 tablet (40 mg total) by mouth daily. 30 tablet 6   polycarbophil (FIBERCON) 625 MG tablet Take 625 mg by mouth in the morning and at bedtime.     rosuvastatin (CRESTOR) 20 MG tablet Take 20 mg by mouth daily.     sucralfate (CARAFATE) 1 g tablet Take 1 tablet (1 g total) by mouth 2 (two) times daily. Try Pantoprazole for 1 week before starting Carafate 60 tablet 6   diphenhydramine-acetaminophen (TYLENOL PM) 25-500 MG TABS tablet Take 1-2 tablets by mouth at bedtime as needed (sleep / pain). (Patient not taking: Reported on 01/19/2023)     melatonin 3 MG TABS tablet Take 3-6 mg by mouth at bedtime as needed (sleep). (Patient not taking: Reported on 02/10/2023)     Multiple Vitamin (MULTI-VITAMIN) tablet Take 1 tablet by mouth daily. (Patient not taking: Reported on 01/19/2023)     Omega-3 Fatty Acids (FISH OIL ULTRA) 1400 MG  CAPS Take 1,400 mg by mouth daily. (Patient not taking: Reported on 01/19/2023)     No current facility-administered medications for this visit.    Review of Systems: No chest pain. No shortness of breath. No urinary complaints.    Physical Exam  Wt Readings from Last 3 Encounters:  02/10/23 119 lb (54 kg)  01/19/23 119 lb (54 kg)  11/03/22 122 lb (55.3 kg)    Ht 5\' 1"  (1.549 m)   BMI 22.48 kg/m  BP 110/66, HR 78. Weight 127 pounds Constitutional:  Pleasant, generally well appearing female in no acute distress. Psychiatric: Normal mood and affect. Behavior is normal. EENT: Pupils normal.  Conjunctivae are normal. No scleral icterus. Neck supple.  Cardiovascular: Normal rate, regular rhythm.  Pulmonary/chest: Effort normal and breath sounds normal. No wheezing, rales or rhonchi. Abdominal: Limited exam. She got on exam table but hurts her to lay down on table. Abdomen soft, nondistended, nontender. Bowel sounds active throughout, abdominal bruit present.  Neurological: Alert and oriented to person place and time.  Skin: Skin is warm and dry. No rashes noted.  Brandi Cluster, NP  04/19/2023, 10:57 AM

## 2023-04-19 NOTE — Patient Instructions (Signed)
_______________________________________________________  If your blood pressure at your visit was 140/90 or greater, please contact your primary care physician to follow up on this. _______________________________________________________  If you are age 82 or older, your body mass index should be between 23-30. Your Body mass index is 24 kg/m. If this is out of the aforementioned range listed, please consider follow up with your Primary Care Provider. ________________________________________________________  The [facility] providers would like to encourage you to use [facility] to communicate with providers for non-urgent requests or questions.  Due to long hold times on the telephone, sending your provider a message by [facility] may be a faster and more efficient way to get a response.  Please allow 48 business hours for a response.  Please remember that this is for non-urgent requests.  _______________________________________________________  Bonita Quin will follow up in our office in 1 year.  Please call our office or send a MyChart message if you need [place] sooner.  Thank you for entrusting me with your care and choosing [facility].  Gunnar Fusi, NP

## 2023-04-20 ENCOUNTER — Ambulatory Visit: Payer: Medicare Other | Admitting: Vascular Surgery

## 2023-04-26 NOTE — Progress Notes (Signed)
Attending Physician's Attestation   I have reviewed the chart.   I agree with the Advanced Practitioner's note, impression, and recommendations with any updates as below.    Maurene Hollin Mansouraty, MD [facility] Office # [phone]  

## 2023-05-11 ENCOUNTER — Ambulatory Visit: Payer: Medicare Other | Admitting: Vascular Surgery

## 2023-05-29 ENCOUNTER — Other Ambulatory Visit: Payer: Self-pay | Admitting: Gastroenterology

## 2023-05-29 DIAGNOSIS — R1084 Generalized abdominal pain: Secondary | ICD-10-CM

## 2023-05-29 DIAGNOSIS — K2980 Duodenitis without bleeding: Secondary | ICD-10-CM

## 2023-06-29 ENCOUNTER — Ambulatory Visit: Payer: Medicare Other | Admitting: Vascular Surgery

## 2023-08-19 IMAGING — CT CT ANGIO AOBIFEM WO/W CM
1 of 18 series · 9 of 48 positions shown, 15 images · IV contrast (APPLIED)
Comparison: None.

CLINICAL DATA: Claudication or leg ischemia; Acute aortic syndrome
(AAS) suspected RLE CRITICAL LIMB ISCHEMIA

EXAM:
CT ANGIOGRAPHY AOBIFEM WITHOUT AND WITH CONTRAST; CT ANGIOGRAPHY
CHEST
TECHNIQUE: Multidetector CT imaging of the chest, abdomen, pelvis and lower
extremities was performed using the standard protocol during bolus
administration of intravenous contrast. Multiplanar CT image
reconstructions and MIPs were obtained to evaluate the vascular
anatomy.

[Series 15: axial arterial lower · axial · arterial · 0.98mm/px · z∈[-1335,-480]mm · 9 of 349 slices shown, 15 images]
[im 32/349  soft-tissue]
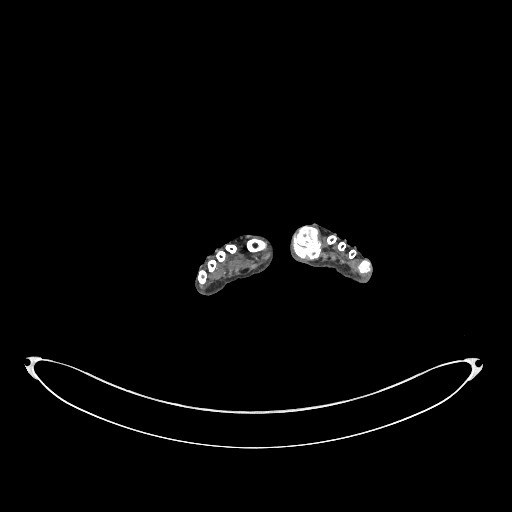
[im 32/349  bone]
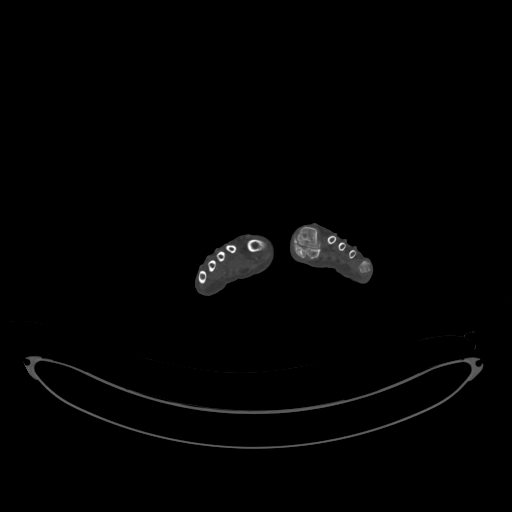
[im 64/349  soft-tissue]
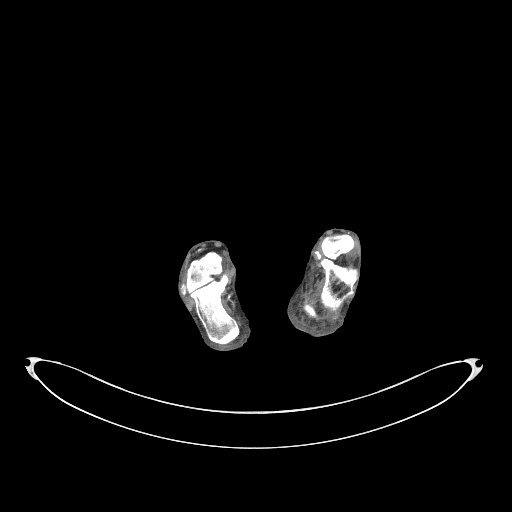
[im 95/349  soft-tissue]
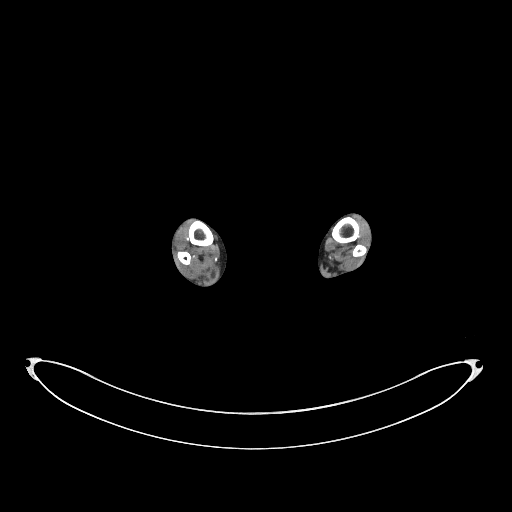
[im 127/349  soft-tissue]
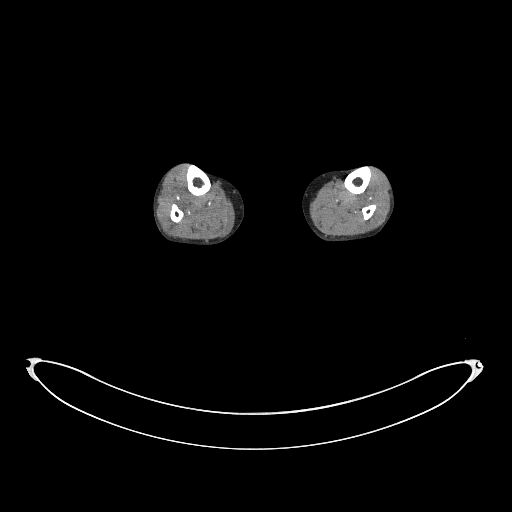
[im 190/349  soft-tissue]
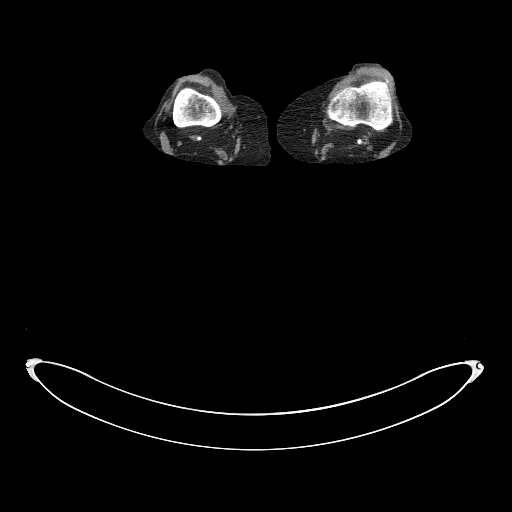
[im 222/349  soft-tissue]
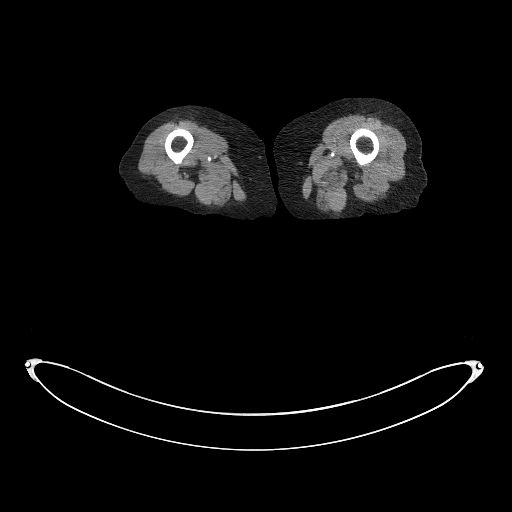
[im 222/349  lung]
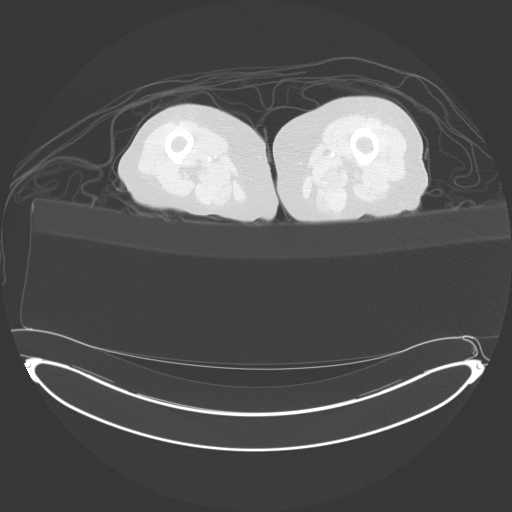
[im 254/349  soft-tissue]
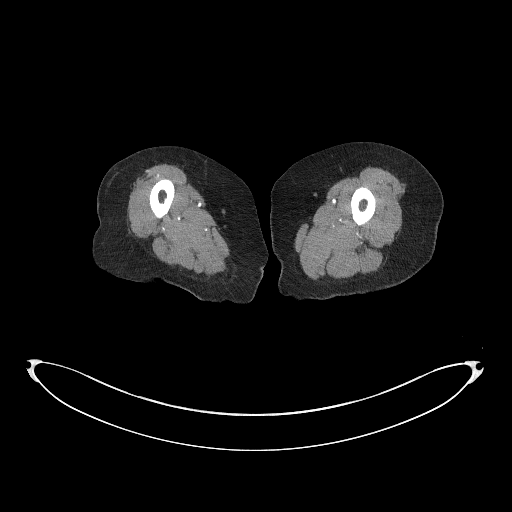
[im 254/349  lung]
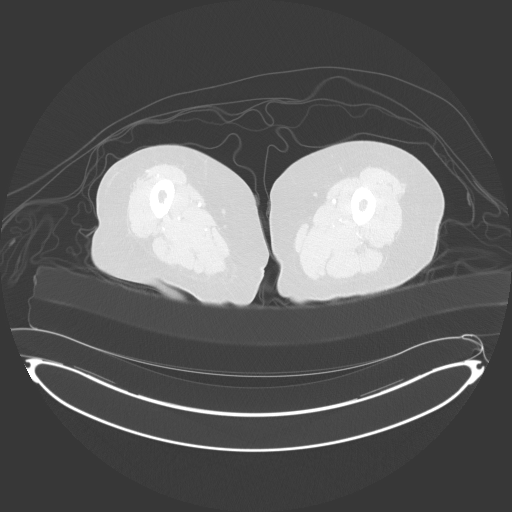
[im 285/349  soft-tissue]
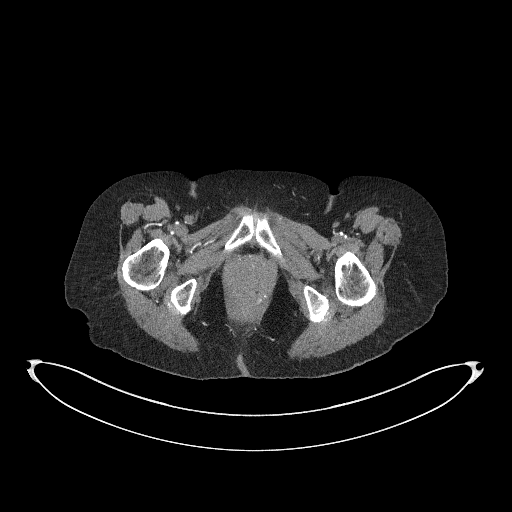
[im 285/349  lung]
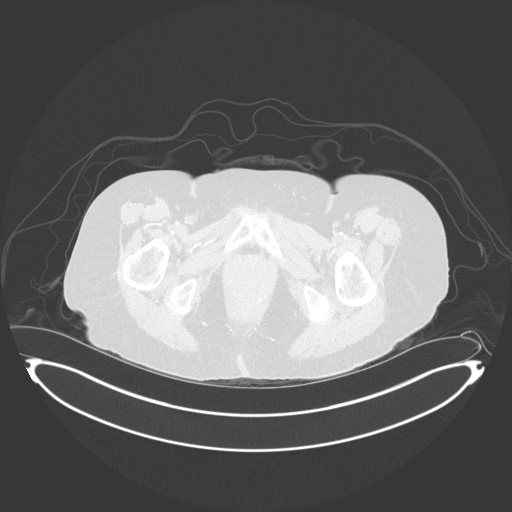
[im 317/349  soft-tissue]
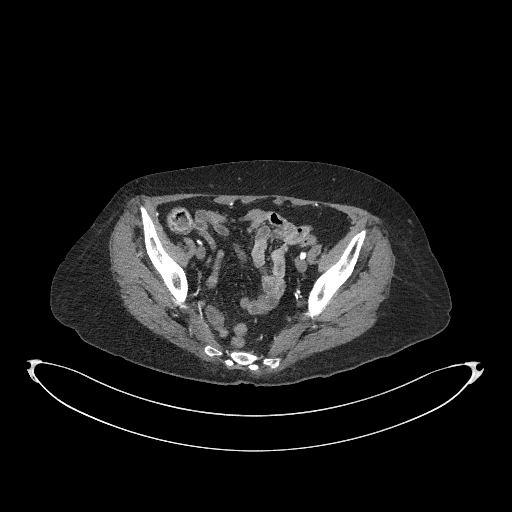
[im 317/349  lung]
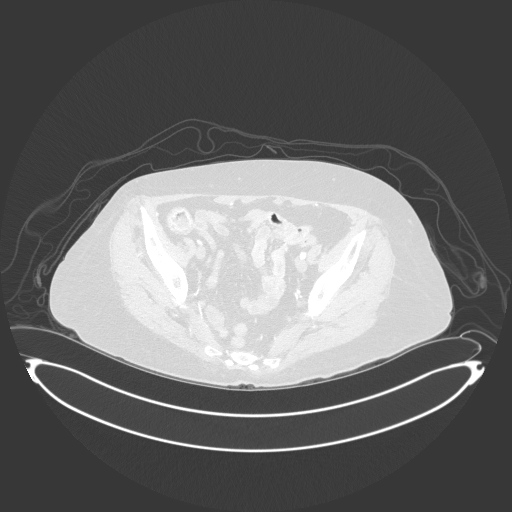
[im 317/349  bone]
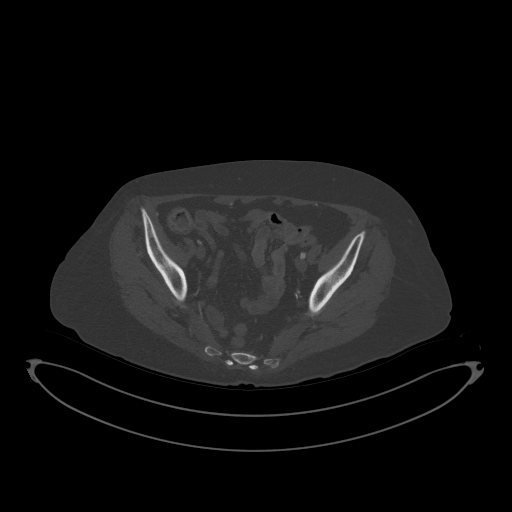

[9 of 48 positions shown; findings below may reference images not displayed]

RADIATION DOSE REDUCTION: This exam was performed according to the
departmental dose-optimization program which includes automated
exposure control, adjustment of the mA and/or kV according to
patient size and/or use of iterative reconstruction technique.

CONTRAST:  100mL OMNIPAQUE IOHEXOL 350 MG/ML SOLN
FINDINGS: CTA CHEST

Cardiovascular: Preferential opacification of the thoracic aorta. No
evidence of thoracic aortic aneurysm or dissection. There are
scattered irregular fibrofatty plaque and calcific atherosclerosis
of the aortic arch and distal descending thoracic aorta. Normal
heart size. Coronary artery calcifications. No pericardial effusion.

Mediastinum/Nodes: No enlarged mediastinal, hilar, or axillary lymph
nodes. The thyroid gland appears normal.

Lungs/Pleura: No pleural effusion. No pneumothorax. Advanced
emphysematous changes in the lungs. No lobar consolidation.
Scattered calcified granulomas. There is an 8 mm solid pulmonary
nodule in the posterior aspect of the left lower lobe that is
slightly obscured in a background of pulmonary scarring (series 4,
image 49).

CTA ABDOMEN AND PELVIS WITH RUNOFF

VASCULAR

Abdominal Aorta: The abdominal aorta is normal in caliber. Note is
made of an atypical mesenteric artery branching pattern with the
origin of the SMA being superior to the celiac artery. Advanced
atherosclerotic disease and fibrofatty plaque throughout the
abdominal aorta.

Celiac: Patent without significant stenosis.

SMA: Patent without significant stenosis.

IMA: Appears occluded.

Right Renal artery: Ostial calcifications of the right renal artery.

Left Renal artery: Ostial calcifications of the left renal artery.
There is an accessory left renal artery supplying the upper pole.

Right lower extremity

Common iliac artery: Advanced aortoiliac disease results in
near-complete occlusion of the right common iliac artery, possible
trickle flow.

Internal iliac artery: Patent.

External iliac artery: Appears diminutive with scattered
atherosclerotic disease.

Common femoral artery: Bulky calcifications result in severe
stenosis.

Profunda femoral artery: Patent without significant stenosis.

Superficial femoral artery: Patent without significant stenosis.

Popliteal artery: Patent without significant stenosis or aneurysm.

Tibioperoneal trunk: Patent without significant stenosis.

Runoff vessels: Appears to be a patent three-vessel runoff. A high
takeoff of the anterior tibial artery above the knee joint is noted.

Left lower extremity

Common iliac artery: Advanced aortoiliac disease results in mild
less than 50% ostial stenosis.

Internal iliac artery: Patent.

External iliac artery: Scattered atherosclerotic disease with less
than 50% stenoses.

Common femoral artery: Calcific plaque results in mild less than 50%
stenosis.

Profunda femoral artery: Patent without significant stenosis.

Superficial femoral artery: Patent without significant stenosis.

Popliteal artery: Patent without significant stenosis or aneurysm.

Tibioperoneal trunk: Patent without significant stenosis.

Runoff vessels: Appears to be a patent three-vessel runoff.

NON-VASCULAR

Hepatobiliary: The liver is normal in size without focal
abnormality. No intrahepatic or extrahepatic biliary ductal
dilation. The gallbladder appears normal.

Spleen: Normal in size without focal abnormality.

Pancreas: No pancreatic ductal dilatation or surrounding
inflammatory changes.

Adrenals/Urinary Tract: Adrenal glands are unremarkable. Kidneys are
normal, without renal calculi, focal lesion, or hydronephrosis.
Bladder is unremarkable.

Stomach/Bowel: The stomach, small bowel and large bowel are normal
in caliber without abnormal wall thickening or surrounding
inflammatory changes. The appendix is normal.

Reproductive: Status post hysterectomy. No adnexal masses.

Lymphatic: No enlarged lymph nodes in the abdomen or pelvis.

Other: No abdominopelvic ascites.

Musculoskeletal: Status post L3-S1 posterior lumbosacral fusion. No
aggressive osseous lesions. The soft tissues are unremarkable.

Review of the MIP images confirms the above findings.
IMPRESSION: CTA chest, abdomen and pelvis with runoff:

Vascular:

1. There is advanced aortoiliac disease which preferentially affects
the right inflow vessels. The right common iliac artery appears
nearly completely occluded, with a diminutive right external iliac
artery. Additionally, the right common femoral artery demonstrates
large calcific plaque which results in severe stenosis.
2. The left iliac vessels demonstrate scattered mild less than 50%
stenoses.
3. The bilateral SFA and popliteal arteries appear patent without
significant stenosis.
4. There appears to be a three-vessel runoff bilaterally.

Nonvascular:

1. Advanced emphysematous changes in the lungs. There is an 8 mm
solid pulmonary nodule in the right lower lobe. Non-contrast chest
CT at 6-12 months is recommended. If the nodule is stable at time of
repeat CT, then future CT at 18-24 months (from today's scan) is
considered optional for low-risk patients, but is recommended for
high-risk patients. This recommendation follows the consensus
statement: Guidelines for Management of Incidental Pulmonary Nodules
Detected on CT Images: From the [HOSPITAL] 7848; Radiology

## 2023-08-19 IMAGING — CT CT ANGIO CHEST
1 of 6 series · 8 of 46 positions shown · IV contrast (APPLIED)
Comparison: None.

CLINICAL DATA: Claudication or leg ischemia; Acute aortic syndrome
(AAS) suspected RLE CRITICAL LIMB ISCHEMIA

EXAM:
CT ANGIOGRAPHY AOBIFEM WITHOUT AND WITH CONTRAST; CT ANGIOGRAPHY
CHEST
TECHNIQUE: Multidetector CT imaging of the chest, abdomen, pelvis and lower
extremities was performed using the standard protocol during bolus
administration of intravenous contrast. Multiplanar CT image
reconstructions and MIPs were obtained to evaluate the vascular
anatomy.

[Series 6: coronals · axial · 0.52mm/px · z∈[-287,-39]mm · 8 of 150 slices shown]
[im 13/150  lung]
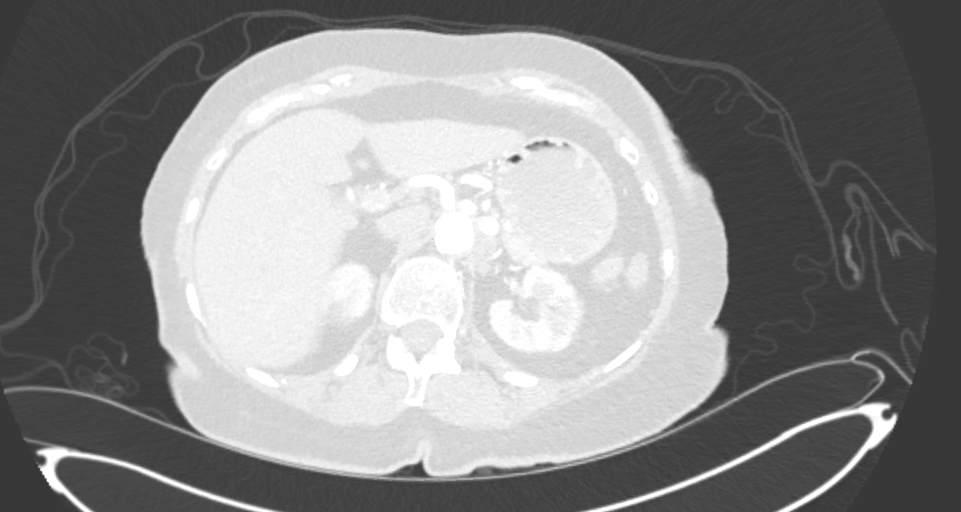
[im 38/150  soft-tissue]
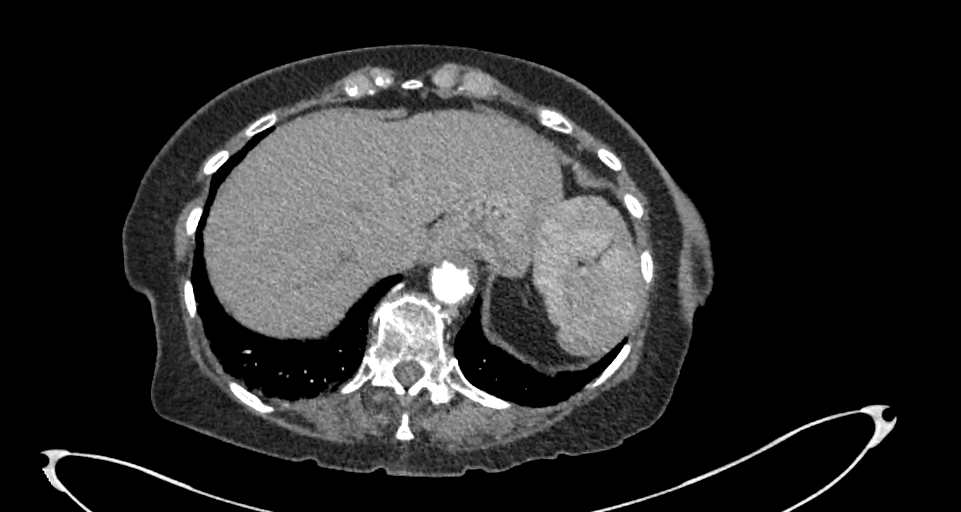
[im 50/150  lung]
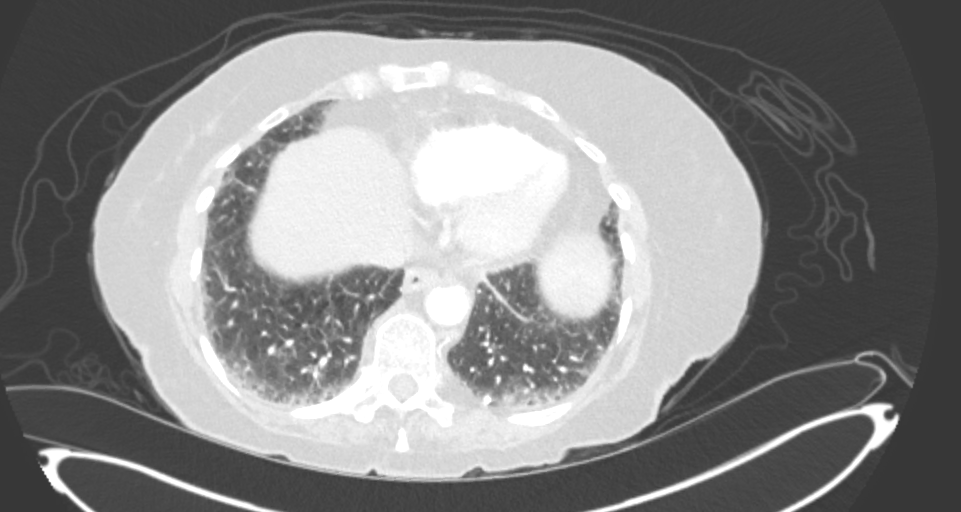
[im 63/150  soft-tissue]
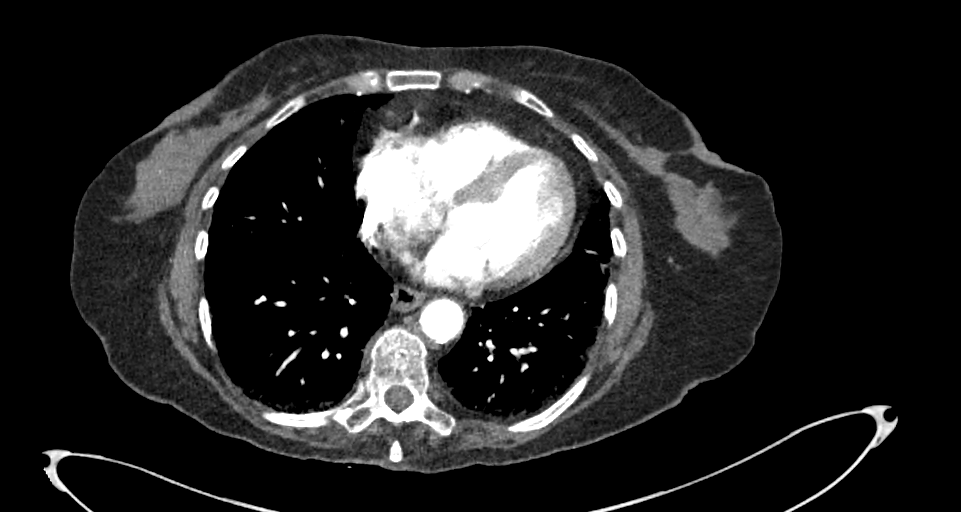
[im 87/150  lung]
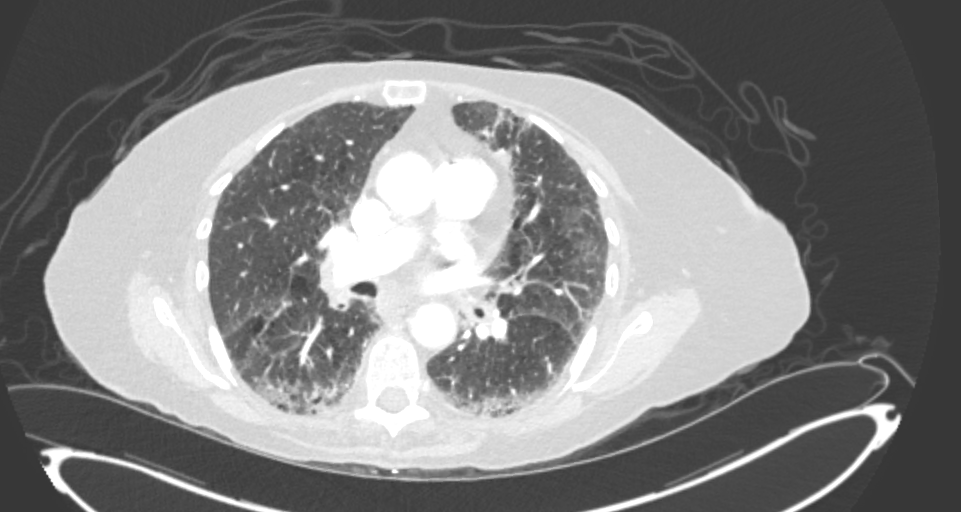
[im 100/150  soft-tissue]
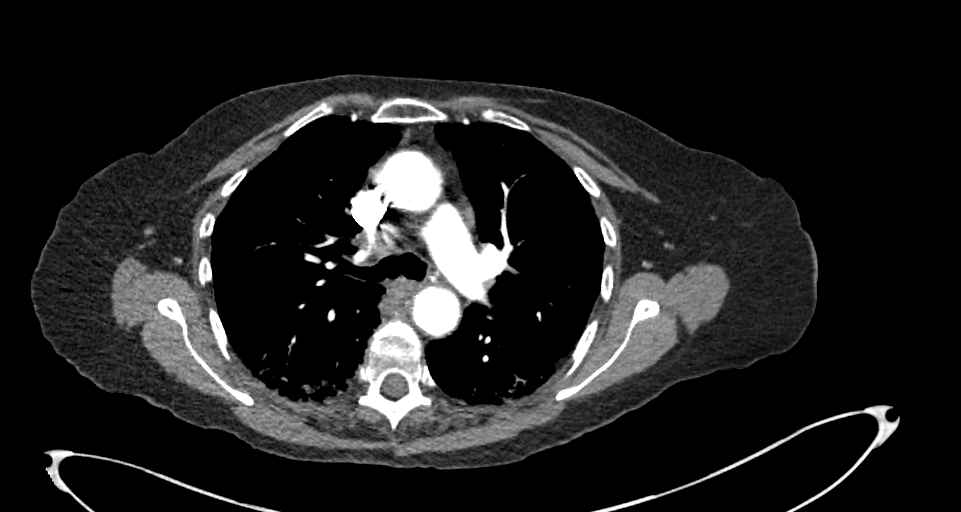
[im 112/150  lung]
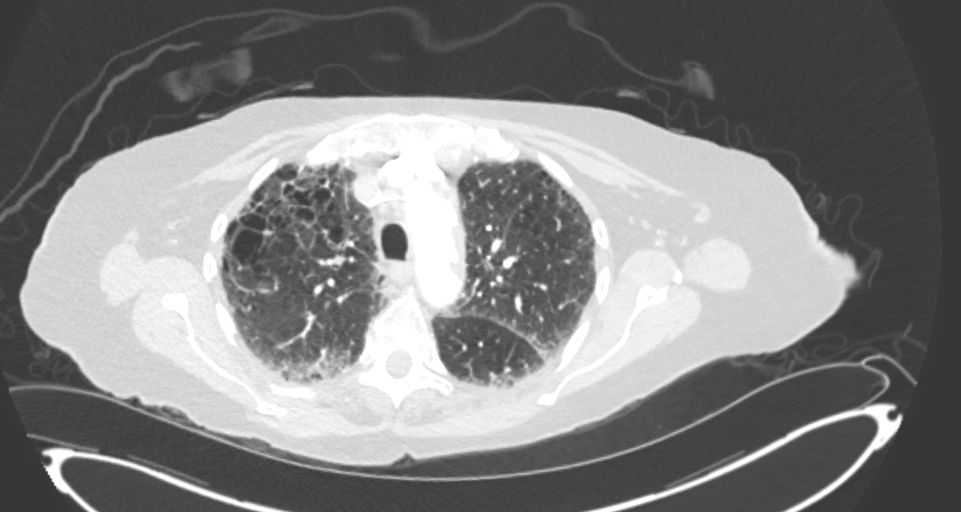
[im 137/150  soft-tissue]
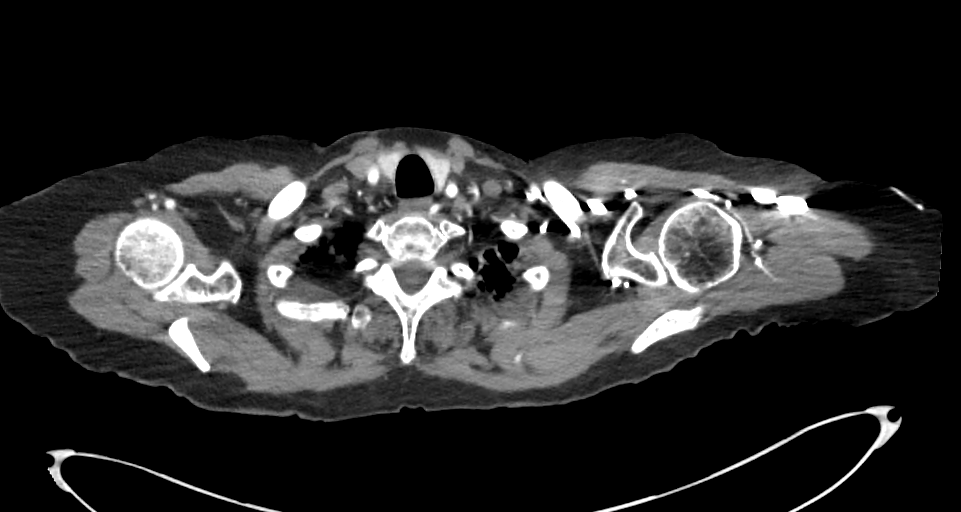

[8 of 46 positions shown; findings below may reference images not displayed]

RADIATION DOSE REDUCTION: This exam was performed according to the
departmental dose-optimization program which includes automated
exposure control, adjustment of the mA and/or kV according to
patient size and/or use of iterative reconstruction technique.

CONTRAST:  100mL OMNIPAQUE IOHEXOL 350 MG/ML SOLN
FINDINGS: CTA CHEST

Cardiovascular: Preferential opacification of the thoracic aorta. No
evidence of thoracic aortic aneurysm or dissection. There are
scattered irregular fibrofatty plaque and calcific atherosclerosis
of the aortic arch and distal descending thoracic aorta. Normal
heart size. Coronary artery calcifications. No pericardial effusion.

Mediastinum/Nodes: No enlarged mediastinal, hilar, or axillary lymph
nodes. The thyroid gland appears normal.

Lungs/Pleura: No pleural effusion. No pneumothorax. Advanced
emphysematous changes in the lungs. No lobar consolidation.
Scattered calcified granulomas. There is an 8 mm solid pulmonary
nodule in the posterior aspect of the left lower lobe that is
slightly obscured in a background of pulmonary scarring (series 4,
image 49).

CTA ABDOMEN AND PELVIS WITH RUNOFF

VASCULAR

Abdominal Aorta: The abdominal aorta is normal in caliber. Note is
made of an atypical mesenteric artery branching pattern with the
origin of the SMA being superior to the celiac artery. Advanced
atherosclerotic disease and fibrofatty plaque throughout the
abdominal aorta.

Celiac: Patent without significant stenosis.

SMA: Patent without significant stenosis.

IMA: Appears occluded.

Right Renal artery: Ostial calcifications of the right renal artery.

Left Renal artery: Ostial calcifications of the left renal artery.
There is an accessory left renal artery supplying the upper pole.

Right lower extremity

Common iliac artery: Advanced aortoiliac disease results in
near-complete occlusion of the right common iliac artery, possible
trickle flow.

Internal iliac artery: Patent.

External iliac artery: Appears diminutive with scattered
atherosclerotic disease.

Common femoral artery: Bulky calcifications result in severe
stenosis.

Profunda femoral artery: Patent without significant stenosis.

Superficial femoral artery: Patent without significant stenosis.

Popliteal artery: Patent without significant stenosis or aneurysm.

Tibioperoneal trunk: Patent without significant stenosis.

Runoff vessels: Appears to be a patent three-vessel runoff. A high
takeoff of the anterior tibial artery above the knee joint is noted.

Left lower extremity

Common iliac artery: Advanced aortoiliac disease results in mild
less than 50% ostial stenosis.

Internal iliac artery: Patent.

External iliac artery: Scattered atherosclerotic disease with less
than 50% stenoses.

Common femoral artery: Calcific plaque results in mild less than 50%
stenosis.

Profunda femoral artery: Patent without significant stenosis.

Superficial femoral artery: Patent without significant stenosis.

Popliteal artery: Patent without significant stenosis or aneurysm.

Tibioperoneal trunk: Patent without significant stenosis.

Runoff vessels: Appears to be a patent three-vessel runoff.

NON-VASCULAR

Hepatobiliary: The liver is normal in size without focal
abnormality. No intrahepatic or extrahepatic biliary ductal
dilation. The gallbladder appears normal.

Spleen: Normal in size without focal abnormality.

Pancreas: No pancreatic ductal dilatation or surrounding
inflammatory changes.

Adrenals/Urinary Tract: Adrenal glands are unremarkable. Kidneys are
normal, without renal calculi, focal lesion, or hydronephrosis.
Bladder is unremarkable.

Stomach/Bowel: The stomach, small bowel and large bowel are normal
in caliber without abnormal wall thickening or surrounding
inflammatory changes. The appendix is normal.

Reproductive: Status post hysterectomy. No adnexal masses.

Lymphatic: No enlarged lymph nodes in the abdomen or pelvis.

Other: No abdominopelvic ascites.

Musculoskeletal: Status post L3-S1 posterior lumbosacral fusion. No
aggressive osseous lesions. The soft tissues are unremarkable.

Review of the MIP images confirms the above findings.
IMPRESSION: CTA chest, abdomen and pelvis with runoff:

Vascular:

1. There is advanced aortoiliac disease which preferentially affects
the right inflow vessels. The right common iliac artery appears
nearly completely occluded, with a diminutive right external iliac
artery. Additionally, the right common femoral artery demonstrates
large calcific plaque which results in severe stenosis.
2. The left iliac vessels demonstrate scattered mild less than 50%
stenoses.
3. The bilateral SFA and popliteal arteries appear patent without
significant stenosis.
4. There appears to be a three-vessel runoff bilaterally.

Nonvascular:

1. Advanced emphysematous changes in the lungs. There is an 8 mm
solid pulmonary nodule in the right lower lobe. Non-contrast chest
CT at 6-12 months is recommended. If the nodule is stable at time of
repeat CT, then future CT at 18-24 months (from today's scan) is
considered optional for low-risk patients, but is recommended for
high-risk patients. This recommendation follows the consensus
statement: Guidelines for Management of Incidental Pulmonary Nodules
Detected on CT Images: From the [HOSPITAL] 7848; Radiology

## 2024-02-08 ENCOUNTER — Other Ambulatory Visit: Payer: Self-pay | Admitting: Gastroenterology

## 2024-02-08 DIAGNOSIS — K2980 Duodenitis without bleeding: Secondary | ICD-10-CM

## 2024-02-08 DIAGNOSIS — R1084 Generalized abdominal pain: Secondary | ICD-10-CM

## 2024-03-30 ENCOUNTER — Ambulatory Visit: Admitting: Physician Assistant

## 2024-06-01 ENCOUNTER — Ambulatory Visit: Admitting: Physician Assistant

## 2024-06-01 ENCOUNTER — Encounter: Payer: Self-pay | Admitting: Physician Assistant

## 2024-06-01 VITALS — BP 132/68 | HR 90 | Ht 61.0 in | Wt 135.0 lb

## 2024-06-01 DIAGNOSIS — K2970 Gastritis, unspecified, without bleeding: Secondary | ICD-10-CM | POA: Diagnosis not present

## 2024-06-01 DIAGNOSIS — Z860102 Personal history of hyperplastic colon polyps: Secondary | ICD-10-CM | POA: Diagnosis not present

## 2024-06-01 MED ORDER — PANTOPRAZOLE SODIUM 40 MG PO TBEC
40.0000 mg | DELAYED_RELEASE_TABLET | Freq: Every day | ORAL | 3 refills | Status: AC
Start: 2024-06-01 — End: ?

## 2024-06-01 NOTE — Progress Notes (Signed)
 Chief Complaint: Follow-up gastritis  HPI:    Brandi Mcmahon is an 83 year old Caucasian female, known to Dr. Wilhelmenia, with a past medical history as listed below including anxiety, PAD on Plavix , large hyperplastic polyp resected in 2021, GERD and multiple others, who presents to clinic today for follow-up of her gastritis.    04/19/2023 patient seen in clinic by Vina Dasen and at that time discussed weight loss and early satiety with postprandial abdominal pain.  Recent EGD did show gastritis and chronic peptic duodenitis.  Symptoms had resolved possibly due to starting Pantoprazole .  She had gained some weight back.  Gastric emptying study was normal.  Continue on daily Pantoprazole .  Also discussed large hyperplastic polyp resected in 2021 and she was not interested in a follow-up colonoscopy.    Today, the patient presents to clinic and tells me that she is doing just fine.  She actually was not even aware that she had gastritis, and is still on Pantoprazole  40 mg daily with no symptoms at all.    Patient recently lost her husband.    Denies fever, chills or weight loss.  Previous GI Endoscopies / Labs / Imaging  02/10/23 EGD for weight loss, abdominal pain  2 cm hiatal hernia.  Gastritis.  Biopsied.  Erythematous duodenopathy.   Diagnosis 1. Surgical [P], gastric - ANTRAL AND OXYNTIC MUCOSA WITH NO SIGNIFICANT PATHOLOGY. - NO HELICOBACTER PYLORI ORGANISMS IDENTIFIED ON H&E STAINED SLIDE. 2. Surgical [P], duodenal - DUODENAL MUCOSA WITH PROMINENT BRUNNER'S GLANDS AND EXTENSIVE FOVEOLAR METAPLASIA AND ASSOCIATED REACTIVE/REPARATIVE CHANGE CONSISTENT WITH CHRONIC PEPTIC DUODENITIS.  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   GERD (gastroesophageal reflux disease)    Hyperlipidemia    Hypertension    Shingles     Past Surgical History:  Procedure  Laterality Date   AORTOGRAM N/A 02/09/2022   Procedure: AORTOGRAM WITH ILIAC ARTERIOGRAM;  Surgeon: Gretta Lonni PARAS, MD;  Location: [facility] OR;  Service: Vascular;  Laterality: N/A;   BREAST LUMPECTOMY Left 11/07/2009   for left DCIS   BUNIONECTOMY  2009   CARDIAC CATHETERIZATION     ~ 2004, reported results unremarkable and shoulder symptoms due to bursitis   COLONOSCOPY  2019   COLONOSCOPY WITH PROPOFOL  N/A 10/23/2020   Procedure: COLONOSCOPY WITH PROPOFOL ;  Surgeon: Wilhelmenia Aloha Raddle., MD;  Location: [facility];  Service: Gastroenterology;  Laterality: N/A;   ENDARTERECTOMY FEMORAL Right 02/09/2022   Procedure: RIGHT COMMON FEMORAL ENDARTERECTOMY WITH PROFUNDA PLASTY AND SUPERFICIAL FEMORAL ARTERY ENDARTERECTOMY;  Surgeon: Gretta Lonni PARAS, MD;  Location: [facility] OR;  Service: Vascular;  Laterality: Right;   HEMOSTASIS CLIP PLACEMENT  10/23/2020   Procedure: HEMOSTASIS CLIP PLACEMENT;  Surgeon: Wilhelmenia Aloha Raddle., MD;  Location: [facility];  Service: Gastroenterology;;   INSERTION OF ILIAC STENT Bilateral 02/09/2022   Procedure: BILATERAL COMMON ILIAC ANGIOPLASTY WITH STENTS;  Surgeon: Gretta Lonni PARAS, MD;  Location: [facility] OR;  Service: Vascular;  Laterality: Bilateral;   INTRAOPERATIVE ARTERIOGRAM Right 02/09/2022   Procedure: RIGHT LOWER EXTREMITY ARTERIOGRAM;  Surgeon: Gretta Lonni PARAS, MD;  Location: [facility] OR;  Service: Vascular;  Laterality: Right;   LUMBAR LAMINECTOMY/DECOMPRESSION MICRODISCECTOMY Right 06/18/2021   Procedure: Laminectomy and Foraminotomy - right - Lumbar four-Lumbar five for resection of synovial cyst;  Surgeon: Onetha Kuba, MD;  Location: [facility] OR;  Service: Neurosurgery;  Laterality: Right;   PATCH ANGIOPLASTY Right 02/09/2022   Procedure: PATCH ANGIOPLASTY USING XENOSURE 1cm x 14cm BIOLOGIC PATCH;  Surgeon: Gretta Lonni PARAS, MD;  Location: [facility] OR;  Service: Vascular;  Laterality: Right;   POLYPECTOMY  10/23/2020   Procedure: POLYPECTOMY;  Surgeon:  Wilhelmenia Aloha Raddle., MD;  Location: [facility];  Service: Gastroenterology;;   SUBMUCOSAL LIFTING INJECTION  10/23/2020   Procedure: SUBMUCOSAL LIFTING INJECTION;  Surgeon: Wilhelmenia Aloha Raddle., MD;  Location: [facility];  Service: Gastroenterology;;   SUBMUCOSAL TATTOO INJECTION  10/23/2020   Procedure: SUBMUCOSAL TATTOO INJECTION;  Surgeon: Wilhelmenia Aloha Raddle., MD;  Location: [facility];  Service: Gastroenterology;;   TOTAL ABDOMINAL HYSTERECTOMY  1987   ULTRASOUND GUIDANCE FOR VASCULAR ACCESS Bilateral 02/09/2022   Procedure: ULTRASOUND GUIDANCE FOR VASCULAR ACCESS LEFT COMMON FEMORAL ARTERY;  Surgeon: Gretta Lonni PARAS, MD;  Location: [facility] OR;  Service: Vascular;  Laterality: Bilateral;    Current Outpatient Medications  Medication Sig Dispense Refill   acetaminophen  (TYLENOL ) 500 MG tablet Take 500-1,000 mg by mouth every 6 (six) hours as needed for moderate pain.     albuterol  (VENTOLIN  HFA) 108 (90 Base) MCG/ACT inhaler Inhale 1-2 puffs into the lungs 4 (four) times daily as needed for shortness of breath or wheezing.     aspirin  EC 81 MG EC tablet Take 1 tablet (81 mg total) by mouth daily. Swallow whole. 30 tablet 11   Cetirizine HCl (ZYRTEC ALLERGY) 10 MG CAPS Take 10 mg by mouth daily.     clopidogrel  (PLAVIX ) 75 MG tablet Take 75 mg by mouth daily.     diazepam  (VALIUM ) 5 MG tablet Take 5 mg by mouth 3 (three) times daily as needed for anxiety.     diphenhydramine -acetaminophen  (TYLENOL  PM) 25-500 MG TABS tablet Take 1-2 tablets by mouth at bedtime as needed (sleep / pain). (Patient not taking: Reported on 01/19/2023)     docusate sodium  (COLACE) 100 MG capsule Take 100-200 mg by mouth at bedtime as needed for mild constipation.     gabapentin  (NEURONTIN ) 100 MG capsule Take 100 mg by mouth 2 (two) times daily.     lisinopril  (ZESTRIL ) 20 MG tablet Take 20 mg by mouth daily.     melatonin 3 MG TABS tablet Take 3-6 mg by mouth at bedtime as needed (sleep). (Patient  not taking: Reported on 02/10/2023)     mometasone (NASONEX) 50 MCG/ACT nasal spray Place 2 sprays into the nose daily as needed (allergies).     montelukast  (SINGULAIR ) 10 MG tablet Take 10 mg by mouth at bedtime.     Multiple Vitamin (MULTI-VITAMIN) tablet Take 1 tablet by mouth daily. (Patient not taking: Reported on 01/19/2023)     MYRBETRIQ  50 MG TB24 tablet Take 50 mg by mouth daily.     Omega-3 Fatty Acids (FISH OIL  ULTRA) 1400 MG CAPS Take 1,400 mg by mouth daily. (Patient not taking: Reported on 01/19/2023)     pantoprazole  (PROTONIX ) 40 MG tablet TAKE 1 TABLET BY MOUTH EVERY DAY 90 tablet 2   polycarbophil (FIBERCON) 625 MG tablet Take 625 mg by mouth in the morning and at bedtime.     rosuvastatin (CRESTOR) 20 MG tablet Take 20 mg by mouth daily.     sucralfate  (CARAFATE ) 1 g tablet Take 1 tablet (1 g total) by mouth 2 (two) times daily. Try Pantoprazole  for 1 week before starting Carafate  60 tablet 6   No current facility-administered medications for this visit.    Allergies as of 06/01/2024 -  Review Complete 04/19/2023  Allergen Reaction Noted   Augmentin [amoxicillin-pot clavulanate] Other (See Comments) 04/02/2014   Clavulanic acid Hives 06/04/2020   Codeine Hives and Other (See Comments) 04/02/2014   Alprazolam Other (See Comments) 04/02/2014   Bee venom Hives 04/02/2014   Sulfa antibiotics Rash and Other (See Comments) 04/02/2014   Tramadol   06/10/2021   Avelox [moxifloxacin] Rash 02/04/2022   Hydrocodone-acetaminophen  Other (See Comments) 04/02/2014    Family History  Problem Relation Age of Onset   Breast cancer Mother    Lung cancer Father    Colon cancer Neg Hx    Colon polyps Neg Hx    Esophageal cancer Neg Hx    Rectal cancer Neg Hx    Stomach cancer Neg Hx    Inflammatory bowel disease Neg Hx    Liver disease Neg Hx    Pancreatic cancer Neg Hx     Social History   Socioeconomic History   Marital status: Married    Spouse name: Not on file   Number of  children: Not on file   Years of education: Not on file   Highest education level: Not on file  Occupational History   Not on file  Tobacco Use   Smoking status: Some Days    Current packs/day: 0.25    Types: Cigarettes   Smokeless tobacco: Never   Tobacco comments:    1-2 per day, trying to quit  Vaping Use   Vaping status: Never Used  Substance and Sexual Activity   Alcohol  use: Not Currently   Drug use: Never   Sexual activity: Not on file  Other Topics Concern   Not on file  Social History Narrative   Not on file   Social Drivers of Health   Financial Resource Strain: Not on file  Food Insecurity: Not on file  Transportation Needs: Not on file  Physical Activity: Not on file  Stress: Not on file  Social Connections: Not on file  Intimate Partner Violence: Not on file    Review of Systems:    Constitutional: No weight loss, fever or chills Cardiovascular: No chest pain Respiratory: No SOB  Gastrointestinal: See HPI and otherwise negative   Physical Exam:  Vital signs: BP 132/68   Pulse 90   Ht 5' 1 (1.549 m)   Wt 135 lb (61.2 kg)   BMI 25.51 kg/m    Constitutional:   Pleasant elderly Caucasian female appears to be in NAD, Well developed, Well nourished, alert and cooperative Respiratory: Respirations even and unlabored. Lungs clear to auscultation bilaterally.   No wheezes, crackles, or rhonchi.  Cardiovascular: Normal S1, S2. No MRG. Regular rate and rhythm. No peripheral edema, cyanosis or pallor.  Gastrointestinal:  Soft, nondistended, nontender. No rebound or guarding. Normal bowel sounds. No appreciable masses or hepatomegaly. Rectal:  Not performed.  Psychiatric: Oriented to person, place and time. Demonstrates good judgement and reason without abnormal affect or behaviors.  See HPI for recent labs/GI studies.  Assessment: 1.  Gastritis: On Pantoprazole  40 mg daily with no symptoms, last EGD in 2024 with signs of gastritis 2.  Large polyp:  Hyperplastic, recommended repeat colonoscopy in 2022, but patient does not want any further colonoscopies  Plan: 1.  Refilled Pantoprazole  40 mg daily, 30 minutes before breakfast #90 with 3 refills. 2.  Patient tells me that it is very hard to get to our clinic.  Explained that she can have this medication refilled by her primary care provider going forward and only  see us  if she has any issues.  She verbalized understanding. 3.  Patient to follow in clinic with us  as needed  Delon Failing, PA-C [facility] 06/01/2024, 2:57 PM  Cc: Mavis Coy, MD

## 2024-06-01 NOTE — Patient Instructions (Signed)
 We have sent the following medications to your pharmacy for you to pick up at your convenience: Pantoprazole  40 mg daily 30-60 minutes before breakfast and dinner.

## 2024-06-02 NOTE — Progress Notes (Signed)
 Attending Physician's Attestation   I have reviewed the chart.   I agree with the Advanced Practitioner's note, impression, and recommendations with any updates as below. I am glad that she is doing okay from a GI perspective.  I am sorry to hear about her husband's passing.  She can follow-up with us  as needed.   Aloha Finner, MD [facility] Advanced Endoscopy Office # [phone]

## 2024-10-28 ENCOUNTER — Inpatient Hospital Stay (HOSPITAL_COMMUNITY)
Admission: EM | Admit: 2024-10-28 | Discharge: 2024-11-07 | DRG: 193 | Disposition: A | Discharge status: Home Health | Attending: Internal Medicine | Admitting: Internal Medicine

## 2024-10-28 ENCOUNTER — Other Ambulatory Visit: Payer: Self-pay

## 2024-10-28 DIAGNOSIS — R29810 Facial weakness: Secondary | ICD-10-CM | POA: Diagnosis present

## 2024-10-28 DIAGNOSIS — D75839 Thrombocytosis, unspecified: Secondary | ICD-10-CM | POA: Diagnosis present

## 2024-10-28 DIAGNOSIS — Z882 Allergy status to sulfonamides status: Secondary | ICD-10-CM

## 2024-10-28 DIAGNOSIS — E876 Hypokalemia: Secondary | ICD-10-CM | POA: Diagnosis present

## 2024-10-28 DIAGNOSIS — D509 Iron deficiency anemia, unspecified: Secondary | ICD-10-CM | POA: Diagnosis present

## 2024-10-28 DIAGNOSIS — I671 Cerebral aneurysm, nonruptured: Secondary | ICD-10-CM | POA: Diagnosis present

## 2024-10-28 DIAGNOSIS — Z888 Allergy status to other drugs, medicaments and biological substances status: Secondary | ICD-10-CM

## 2024-10-28 DIAGNOSIS — Z6823 Body mass index (BMI) 23.0-23.9, adult: Secondary | ICD-10-CM

## 2024-10-28 DIAGNOSIS — K760 Fatty (change of) liver, not elsewhere classified: Secondary | ICD-10-CM | POA: Diagnosis present

## 2024-10-28 DIAGNOSIS — Z7902 Long term (current) use of antithrombotics/antiplatelets: Secondary | ICD-10-CM

## 2024-10-28 DIAGNOSIS — Z23 Encounter for immunization: Secondary | ICD-10-CM

## 2024-10-28 DIAGNOSIS — F419 Anxiety disorder, unspecified: Secondary | ICD-10-CM | POA: Diagnosis present

## 2024-10-28 DIAGNOSIS — K219 Gastro-esophageal reflux disease without esophagitis: Secondary | ICD-10-CM | POA: Diagnosis present

## 2024-10-28 DIAGNOSIS — D472 Monoclonal gammopathy: Secondary | ICD-10-CM | POA: Diagnosis present

## 2024-10-28 DIAGNOSIS — Z66 Do not resuscitate: Secondary | ICD-10-CM | POA: Diagnosis present

## 2024-10-28 DIAGNOSIS — J440 Chronic obstructive pulmonary disease with acute lower respiratory infection: Secondary | ICD-10-CM | POA: Diagnosis present

## 2024-10-28 DIAGNOSIS — Z91030 Bee allergy status: Secondary | ICD-10-CM

## 2024-10-28 DIAGNOSIS — J939 Pneumothorax, unspecified: Secondary | ICD-10-CM | POA: Diagnosis present

## 2024-10-28 DIAGNOSIS — Z7982 Long term (current) use of aspirin: Secondary | ICD-10-CM

## 2024-10-28 DIAGNOSIS — J948 Other specified pleural conditions: Secondary | ICD-10-CM | POA: Diagnosis present

## 2024-10-28 DIAGNOSIS — J189 Pneumonia, unspecified organism: Principal | ICD-10-CM | POA: Diagnosis present

## 2024-10-28 DIAGNOSIS — J9621 Acute and chronic respiratory failure with hypoxia: Secondary | ICD-10-CM | POA: Diagnosis present

## 2024-10-28 DIAGNOSIS — Z9981 Dependence on supplemental oxygen: Secondary | ICD-10-CM

## 2024-10-28 DIAGNOSIS — G8929 Other chronic pain: Secondary | ICD-10-CM | POA: Diagnosis present

## 2024-10-28 DIAGNOSIS — I739 Peripheral vascular disease, unspecified: Secondary | ICD-10-CM | POA: Diagnosis present

## 2024-10-28 DIAGNOSIS — B370 Candidal stomatitis: Secondary | ICD-10-CM | POA: Diagnosis not present

## 2024-10-28 DIAGNOSIS — Z881 Allergy status to other antibiotic agents status: Secondary | ICD-10-CM

## 2024-10-28 DIAGNOSIS — J439 Emphysema, unspecified: Secondary | ICD-10-CM | POA: Diagnosis present

## 2024-10-28 DIAGNOSIS — R4701 Aphasia: Secondary | ICD-10-CM | POA: Diagnosis present

## 2024-10-28 DIAGNOSIS — Z79899 Other long term (current) drug therapy: Secondary | ICD-10-CM

## 2024-10-28 DIAGNOSIS — Z1152 Encounter for screening for COVID-19: Secondary | ICD-10-CM

## 2024-10-28 DIAGNOSIS — R531 Weakness: Secondary | ICD-10-CM

## 2024-10-28 DIAGNOSIS — Z885 Allergy status to narcotic agent status: Secondary | ICD-10-CM

## 2024-10-28 DIAGNOSIS — E785 Hyperlipidemia, unspecified: Secondary | ICD-10-CM | POA: Diagnosis present

## 2024-10-28 DIAGNOSIS — J9611 Chronic respiratory failure with hypoxia: Secondary | ICD-10-CM

## 2024-10-28 DIAGNOSIS — F17210 Nicotine dependence, cigarettes, uncomplicated: Secondary | ICD-10-CM | POA: Diagnosis present

## 2024-10-28 DIAGNOSIS — G459 Transient cerebral ischemic attack, unspecified: Secondary | ICD-10-CM | POA: Diagnosis present

## 2024-10-28 DIAGNOSIS — I2510 Atherosclerotic heart disease of native coronary artery without angina pectoris: Secondary | ICD-10-CM | POA: Diagnosis present

## 2024-10-28 DIAGNOSIS — J441 Chronic obstructive pulmonary disease with (acute) exacerbation: Secondary | ICD-10-CM | POA: Diagnosis present

## 2024-10-28 DIAGNOSIS — E43 Unspecified severe protein-calorie malnutrition: Secondary | ICD-10-CM | POA: Diagnosis present

## 2024-10-28 DIAGNOSIS — I10 Essential (primary) hypertension: Secondary | ICD-10-CM | POA: Diagnosis present

## 2024-10-28 DIAGNOSIS — J918 Pleural effusion in other conditions classified elsewhere: Secondary | ICD-10-CM | POA: Diagnosis present

## 2024-10-28 NOTE — ED Triage Notes (Signed)
 Pt daughter states pt went to restroom around 2215 when she started having bilateral weakness, and was slow to respond. Per daughter this episode lasted around 10 minutes, pt is now back to her baseline. Pt has also had congestion and cough x1 week

## 2024-10-29 ENCOUNTER — Emergency Department (HOSPITAL_COMMUNITY)

## 2024-10-29 ENCOUNTER — Inpatient Hospital Stay (HOSPITAL_COMMUNITY)

## 2024-10-29 ENCOUNTER — Encounter (HOSPITAL_COMMUNITY): Payer: Self-pay | Admitting: Internal Medicine

## 2024-10-29 DIAGNOSIS — J441 Chronic obstructive pulmonary disease with (acute) exacerbation: Secondary | ICD-10-CM | POA: Diagnosis present

## 2024-10-29 DIAGNOSIS — R7989 Other specified abnormal findings of blood chemistry: Secondary | ICD-10-CM | POA: Diagnosis not present

## 2024-10-29 DIAGNOSIS — I671 Cerebral aneurysm, nonruptured: Secondary | ICD-10-CM | POA: Diagnosis present

## 2024-10-29 DIAGNOSIS — J918 Pleural effusion in other conditions classified elsewhere: Secondary | ICD-10-CM | POA: Diagnosis present

## 2024-10-29 DIAGNOSIS — I10 Essential (primary) hypertension: Secondary | ICD-10-CM | POA: Diagnosis present

## 2024-10-29 DIAGNOSIS — G459 Transient cerebral ischemic attack, unspecified: Secondary | ICD-10-CM

## 2024-10-29 DIAGNOSIS — J939 Pneumothorax, unspecified: Secondary | ICD-10-CM | POA: Diagnosis present

## 2024-10-29 DIAGNOSIS — D509 Iron deficiency anemia, unspecified: Secondary | ICD-10-CM | POA: Diagnosis present

## 2024-10-29 DIAGNOSIS — E782 Mixed hyperlipidemia: Secondary | ICD-10-CM | POA: Diagnosis not present

## 2024-10-29 DIAGNOSIS — J439 Emphysema, unspecified: Secondary | ICD-10-CM | POA: Diagnosis present

## 2024-10-29 DIAGNOSIS — J9621 Acute and chronic respiratory failure with hypoxia: Secondary | ICD-10-CM | POA: Diagnosis present

## 2024-10-29 DIAGNOSIS — E43 Unspecified severe protein-calorie malnutrition: Secondary | ICD-10-CM | POA: Diagnosis present

## 2024-10-29 DIAGNOSIS — Z23 Encounter for immunization: Secondary | ICD-10-CM | POA: Diagnosis present

## 2024-10-29 DIAGNOSIS — Z66 Do not resuscitate: Secondary | ICD-10-CM | POA: Diagnosis present

## 2024-10-29 DIAGNOSIS — J948 Other specified pleural conditions: Secondary | ICD-10-CM | POA: Diagnosis present

## 2024-10-29 DIAGNOSIS — Z9981 Dependence on supplemental oxygen: Secondary | ICD-10-CM | POA: Diagnosis not present

## 2024-10-29 DIAGNOSIS — R4701 Aphasia: Secondary | ICD-10-CM | POA: Diagnosis present

## 2024-10-29 DIAGNOSIS — B370 Candidal stomatitis: Secondary | ICD-10-CM | POA: Diagnosis not present

## 2024-10-29 DIAGNOSIS — J90 Pleural effusion, not elsewhere classified: Secondary | ICD-10-CM | POA: Diagnosis not present

## 2024-10-29 DIAGNOSIS — K760 Fatty (change of) liver, not elsewhere classified: Secondary | ICD-10-CM | POA: Diagnosis present

## 2024-10-29 DIAGNOSIS — J440 Chronic obstructive pulmonary disease with acute lower respiratory infection: Secondary | ICD-10-CM | POA: Diagnosis present

## 2024-10-29 DIAGNOSIS — I739 Peripheral vascular disease, unspecified: Secondary | ICD-10-CM | POA: Diagnosis present

## 2024-10-29 DIAGNOSIS — Z7902 Long term (current) use of antithrombotics/antiplatelets: Secondary | ICD-10-CM | POA: Diagnosis not present

## 2024-10-29 DIAGNOSIS — J189 Pneumonia, unspecified organism: Secondary | ICD-10-CM | POA: Diagnosis present

## 2024-10-29 DIAGNOSIS — K219 Gastro-esophageal reflux disease without esophagitis: Secondary | ICD-10-CM | POA: Diagnosis present

## 2024-10-29 DIAGNOSIS — D472 Monoclonal gammopathy: Secondary | ICD-10-CM | POA: Diagnosis present

## 2024-10-29 DIAGNOSIS — R531 Weakness: Secondary | ICD-10-CM | POA: Diagnosis present

## 2024-10-29 DIAGNOSIS — Z1152 Encounter for screening for COVID-19: Secondary | ICD-10-CM | POA: Diagnosis not present

## 2024-10-29 DIAGNOSIS — I6522 Occlusion and stenosis of left carotid artery: Secondary | ICD-10-CM | POA: Diagnosis not present

## 2024-10-29 DIAGNOSIS — J9611 Chronic respiratory failure with hypoxia: Secondary | ICD-10-CM | POA: Diagnosis not present

## 2024-10-29 LAB — CBC WITH DIFFERENTIAL/PLATELET
Abs Immature Granulocytes: 0.03 K/uL (ref 0.00–0.07)
Abs Immature Granulocytes: 0.06 K/uL (ref 0.00–0.07)
Basophils Absolute: 0 K/uL (ref 0.0–0.1)
Basophils Absolute: 0.1 K/uL (ref 0.0–0.1)
Basophils Relative: 0 %
Basophils Relative: 0 %
Eosinophils Absolute: 0 K/uL (ref 0.0–0.5)
Eosinophils Absolute: 0 K/uL (ref 0.0–0.5)
Eosinophils Relative: 0 %
Eosinophils Relative: 0 %
HCT: 24.2 % — ABNORMAL LOW (ref 36.0–46.0)
HCT: 31.4 % — ABNORMAL LOW (ref 36.0–46.0)
Hemoglobin: 7.2 g/dL — ABNORMAL LOW (ref 12.0–15.0)
Hemoglobin: 9 g/dL — ABNORMAL LOW (ref 12.0–15.0)
Immature Granulocytes: 0 %
Immature Granulocytes: 1 %
Lymphocytes Relative: 10 %
Lymphocytes Relative: 20 %
Lymphs Abs: 1.3 K/uL (ref 0.7–4.0)
Lymphs Abs: 2 K/uL (ref 0.7–4.0)
MCH: 21.6 pg — ABNORMAL LOW (ref 26.0–34.0)
MCH: 22.3 pg — ABNORMAL LOW (ref 26.0–34.0)
MCHC: 28.7 g/dL — ABNORMAL LOW (ref 30.0–36.0)
MCHC: 29.8 g/dL — ABNORMAL LOW (ref 30.0–36.0)
MCV: 74.9 fL — ABNORMAL LOW (ref 80.0–100.0)
MCV: 75.5 fL — ABNORMAL LOW (ref 80.0–100.0)
Monocytes Absolute: 0.8 K/uL (ref 0.1–1.0)
Monocytes Absolute: 0.8 K/uL (ref 0.1–1.0)
Monocytes Relative: 6 %
Monocytes Relative: 7 %
Neutro Abs: 10.6 K/uL — ABNORMAL HIGH (ref 1.7–7.7)
Neutro Abs: 7.2 K/uL (ref 1.7–7.7)
Neutrophils Relative %: 73 %
Neutrophils Relative %: 83 %
Platelets: 516 K/uL — ABNORMAL HIGH (ref 150–400)
Platelets: 633 K/uL — ABNORMAL HIGH (ref 150–400)
RBC: 3.23 MIL/uL — ABNORMAL LOW (ref 3.87–5.11)
RBC: 4.16 MIL/uL (ref 3.87–5.11)
RDW: 20.4 % — ABNORMAL HIGH (ref 11.5–15.5)
RDW: 20.9 % — ABNORMAL HIGH (ref 11.5–15.5)
WBC: 10.1 K/uL (ref 4.0–10.5)
WBC: 12.7 K/uL — ABNORMAL HIGH (ref 4.0–10.5)
nRBC: 0 % (ref 0.0–0.2)
nRBC: 0 % (ref 0.0–0.2)

## 2024-10-29 LAB — LIPID PANEL
Cholesterol: 86 mg/dL (ref 0–200)
HDL: 39 mg/dL — ABNORMAL LOW
LDL Cholesterol: 32 mg/dL (ref 0–99)
Total CHOL/HDL Ratio: 2.2 ratio
Triglycerides: 78 mg/dL
VLDL: 16 mg/dL (ref 0–40)

## 2024-10-29 LAB — COMPREHENSIVE METABOLIC PANEL WITH GFR
ALT: 10 U/L (ref 0–44)
AST: 28 U/L (ref 15–41)
Albumin: 3.1 g/dL — ABNORMAL LOW (ref 3.5–5.0)
Alkaline Phosphatase: 118 U/L (ref 38–126)
Anion gap: 10 (ref 5–15)
BUN: 13 mg/dL (ref 8–23)
CO2: 27 mmol/L (ref 22–32)
Calcium: 8.4 mg/dL — ABNORMAL LOW (ref 8.9–10.3)
Chloride: 98 mmol/L (ref 98–111)
Creatinine, Ser: 0.83 mg/dL (ref 0.44–1.00)
GFR, Estimated: >60 mL/min
Glucose, Bld: 101 mg/dL — ABNORMAL HIGH (ref 70–99)
Potassium: 3.4 mmol/L — ABNORMAL LOW (ref 3.5–5.1)
Sodium: 135 mmol/L (ref 135–145)
Total Bilirubin: 0.2 mg/dL (ref 0.0–1.2)
Total Protein: 6.1 g/dL — ABNORMAL LOW (ref 6.5–8.1)

## 2024-10-29 LAB — ECHOCARDIOGRAM COMPLETE
AR max vel: 2.7 cm2
AV Area VTI: 2.63 cm2
AV Area mean vel: 2.74 cm2
AV Mean grad: 2 mmHg
AV Peak grad: 3.8 mmHg
Ao pk vel: 0.98 m/s
Area-P 1/2: 4.04 cm2
Height: 60 in
S' Lateral: 2.6 cm
Weight: 1936 [oz_av]

## 2024-10-29 LAB — EXPECTORATED SPUTUM ASSESSMENT W GRAM STAIN, RFLX TO RESP C

## 2024-10-29 LAB — BASIC METABOLIC PANEL WITH GFR
Anion gap: 11 (ref 5–15)
BUN: 14 mg/dL (ref 8–23)
CO2: 29 mmol/L (ref 22–32)
Calcium: 9.2 mg/dL (ref 8.9–10.3)
Chloride: 98 mmol/L (ref 98–111)
Creatinine, Ser: 0.93 mg/dL (ref 0.44–1.00)
GFR, Estimated: >60 mL/min
Glucose, Bld: 124 mg/dL — ABNORMAL HIGH (ref 70–99)
Potassium: 3.7 mmol/L (ref 3.5–5.1)
Sodium: 137 mmol/L (ref 135–145)

## 2024-10-29 LAB — MAGNESIUM: Magnesium: 1.5 mg/dL — ABNORMAL LOW (ref 1.7–2.4)

## 2024-10-29 LAB — RESP PANEL BY RT-PCR (RSV, FLU A&B, COVID)  RVPGX2
Influenza A by PCR: NEGATIVE
Influenza B by PCR: NEGATIVE
Resp Syncytial Virus by PCR: NEGATIVE
SARS Coronavirus 2 by RT PCR: NEGATIVE

## 2024-10-29 LAB — HEMOGLOBIN A1C
Hgb A1c MFr Bld: 5.5 % (ref 4.8–5.6)
Mean Plasma Glucose: 111.15 mg/dL

## 2024-10-29 LAB — PHOSPHORUS: Phosphorus: 3.1 mg/dL (ref 2.5–4.6)

## 2024-10-29 LAB — PROTIME-INR
INR: 1.1 (ref 0.8–1.2)
Prothrombin Time: 14.8 s (ref 11.4–15.2)

## 2024-10-29 MED ORDER — SODIUM CHLORIDE 0.9 % IV SOLN
500.0000 mg | Freq: Once | INTRAVENOUS | Status: AC
  Administered 2024-10-29: 500 mg via INTRAVENOUS
  Filled 2024-10-29: qty 5

## 2024-10-29 MED ORDER — SODIUM CHLORIDE 0.9 % IV SOLN
500.0000 mg | INTRAVENOUS | Status: DC
  Administered 2024-10-30 – 2024-10-31 (×2): 500 mg via INTRAVENOUS
  Filled 2024-10-29 (×2): qty 5

## 2024-10-29 MED ORDER — ROSUVASTATIN CALCIUM 20 MG PO TABS
20.0000 mg | ORAL_TABLET | Freq: Every day | ORAL | Status: DC
  Administered 2024-10-30 – 2024-11-05 (×7): 20 mg via ORAL
  Filled 2024-10-29 (×8): qty 1

## 2024-10-29 MED ORDER — STROKE: EARLY STAGES OF RECOVERY BOOK: Freq: Once | Status: AC

## 2024-10-29 MED ORDER — LACTATED RINGERS IV SOLN: INTRAVENOUS | Status: AC

## 2024-10-29 MED ORDER — GUAIFENESIN-DM 100-10 MG/5ML PO SYRP
5.0000 mL | ORAL_SOLUTION | ORAL | Status: DC | PRN
  Administered 2024-10-29 – 2024-10-31 (×3): 5 mL via ORAL
  Filled 2024-10-29 (×4): qty 5

## 2024-10-29 MED ORDER — BUDESONIDE 0.5 MG/2ML IN SUSP
0.5000 mg | Freq: Two times a day (BID) | RESPIRATORY_TRACT | Status: DC
  Administered 2024-10-29 – 2024-11-03 (×10): 0.5 mg via RESPIRATORY_TRACT
  Filled 2024-10-29 (×12): qty 2

## 2024-10-29 MED ORDER — IOHEXOL 350 MG/ML SOLN
75.0000 mL | Freq: Once | INTRAVENOUS | Status: AC | PRN
  Administered 2024-10-29: 75 mL via INTRAVENOUS

## 2024-10-29 MED ORDER — PANTOPRAZOLE SODIUM 40 MG PO TBEC
40.0000 mg | DELAYED_RELEASE_TABLET | Freq: Every day | ORAL | Status: DC
  Filled 2024-10-29: qty 1

## 2024-10-29 MED ORDER — DM-GUAIFENESIN ER 30-600 MG PO TB12
1.0000 | ORAL_TABLET | Freq: Two times a day (BID) | ORAL | Status: DC
  Administered 2024-10-29 – 2024-11-07 (×18): 1 via ORAL
  Filled 2024-10-29 (×20): qty 1

## 2024-10-29 MED ORDER — ACETAMINOPHEN 500 MG PO TABS
500.0000 mg | ORAL_TABLET | Freq: Four times a day (QID) | ORAL | Status: AC | PRN
  Administered 2024-11-01 – 2024-11-03 (×4): 500 mg via ORAL
  Filled 2024-10-29 (×4): qty 1

## 2024-10-29 MED ORDER — IPRATROPIUM-ALBUTEROL 0.5-2.5 (3) MG/3ML IN SOLN: 3.0000 mL | RESPIRATORY_TRACT | Status: DC | PRN

## 2024-10-29 MED ORDER — PROCHLORPERAZINE EDISYLATE 10 MG/2ML IJ SOLN
5.0000 mg | Freq: Four times a day (QID) | INTRAMUSCULAR | Status: DC | PRN
  Administered 2024-11-02: 5 mg via INTRAVENOUS
  Filled 2024-10-29 (×2): qty 2

## 2024-10-29 MED ORDER — FLUTICASONE PROPIONATE 50 MCG/ACT NA SUSP
2.0000 | Freq: Every day | NASAL | Status: AC
  Administered 2024-10-29 – 2024-10-31 (×3): 2 via NASAL
  Filled 2024-10-29: qty 16

## 2024-10-29 MED ORDER — INFLUENZA VAC SPLIT HIGH-DOSE 0.5 ML IM SUSY
0.5000 mL | PREFILLED_SYRINGE | INTRAMUSCULAR | Status: AC
  Administered 2024-10-31: 0.5 mL via INTRAMUSCULAR
  Filled 2024-10-29: qty 0.5

## 2024-10-29 MED ORDER — FISH OIL ULTRA 1400 MG PO CAPS: 1400.0000 mg | ORAL_CAPSULE | Freq: Every day | ORAL | Status: DC

## 2024-10-29 MED ORDER — MELATONIN 3 MG PO TABS
6.0000 mg | ORAL_TABLET | Freq: Every evening | ORAL | Status: DC | PRN
  Administered 2024-10-29: 6 mg via ORAL
  Filled 2024-10-29 (×2): qty 2

## 2024-10-29 MED ORDER — MONTELUKAST SODIUM 10 MG PO TABS: 10.0000 mg | ORAL_TABLET | Freq: Every day | ORAL | Status: DC

## 2024-10-29 MED ORDER — POLYETHYLENE GLYCOL 3350 17 G PO PACK
17.0000 g | PACK | Freq: Every day | ORAL | Status: DC | PRN
  Administered 2024-10-29 – 2024-11-06 (×5): 17 g via ORAL
  Filled 2024-10-29 (×4): qty 1

## 2024-10-29 MED ORDER — IPRATROPIUM-ALBUTEROL 0.5-2.5 (3) MG/3ML IN SOLN
3.0000 mL | Freq: Four times a day (QID) | RESPIRATORY_TRACT | Status: DC
  Administered 2024-10-29 (×3): 3 mL via RESPIRATORY_TRACT
  Filled 2024-10-29 (×3): qty 3

## 2024-10-29 MED ORDER — ENOXAPARIN SODIUM 40 MG/0.4ML IJ SOSY
40.0000 mg | PREFILLED_SYRINGE | INTRAMUSCULAR | Status: DC
  Administered 2024-10-29 – 2024-11-07 (×10): 40 mg via SUBCUTANEOUS
  Filled 2024-10-29 (×10): qty 0.4

## 2024-10-29 MED ORDER — SODIUM CHLORIDE 0.9 % IV SOLN
1.0000 g | Freq: Once | INTRAVENOUS | Status: AC
  Administered 2024-10-29: 1 g via INTRAVENOUS
  Filled 2024-10-29: qty 10

## 2024-10-29 MED ORDER — SODIUM CHLORIDE 0.9 % IV SOLN
2.0000 g | INTRAVENOUS | Status: DC
  Administered 2024-10-29 – 2024-11-06 (×9): 2 g via INTRAVENOUS
  Filled 2024-10-29 (×9): qty 20

## 2024-10-29 MED ORDER — GADOBUTROL 1 MMOL/ML IV SOLN
5.0000 mL | Freq: Once | INTRAVENOUS | Status: AC | PRN
  Administered 2024-10-29: 5 mL via INTRAVENOUS

## 2024-10-29 MED ORDER — PANTOPRAZOLE SODIUM 40 MG PO TBEC
40.0000 mg | DELAYED_RELEASE_TABLET | Freq: Two times a day (BID) | ORAL | Status: DC
  Administered 2024-10-29 – 2024-11-07 (×19): 40 mg via ORAL
  Filled 2024-10-29 (×19): qty 1

## 2024-10-29 MED ORDER — METHYLPREDNISOLONE SODIUM SUCC 40 MG IJ SOLR
40.0000 mg | Freq: Two times a day (BID) | INTRAMUSCULAR | Status: DC
  Administered 2024-10-29 – 2024-11-01 (×7): 40 mg via INTRAVENOUS
  Filled 2024-10-29 (×7): qty 1

## 2024-10-29 MED ORDER — CLOPIDOGREL BISULFATE 75 MG PO TABS
75.0000 mg | ORAL_TABLET | Freq: Every day | ORAL | Status: DC
  Administered 2024-10-29 – 2024-11-02 (×5): 75 mg via ORAL
  Filled 2024-10-29 (×5): qty 1

## 2024-10-29 MED ORDER — ENSURE PLUS HIGH PROTEIN PO LIQD
237.0000 mL | Freq: Two times a day (BID) | ORAL | Status: DC
  Administered 2024-10-29 – 2024-11-02 (×5): 237 mL via ORAL

## 2024-10-29 MED ORDER — GUAIFENESIN-DM 100-10 MG/5ML PO SYRP: 5.0000 mL | ORAL_SOLUTION | ORAL | Status: DC | PRN

## 2024-10-29 MED ORDER — IPRATROPIUM-ALBUTEROL 0.5-2.5 (3) MG/3ML IN SOLN
3.0000 mL | Freq: Two times a day (BID) | RESPIRATORY_TRACT | Status: DC
  Administered 2024-10-30 – 2024-11-06 (×13): 3 mL via RESPIRATORY_TRACT
  Filled 2024-10-29 (×17): qty 3

## 2024-10-29 NOTE — Progress Notes (Signed)
 Patient seen and examined, admitted after midnight secondary to productive coughing spells, generalized and TIA symptoms. 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 Patient afebrile and hemodynamically stable otherwise. Please refer to H&P written by Dr. Shona on 10/29/24 for further info/details on admission.  Physical exam General exam: Alert, awake, oriented and following commands appropriately. Respiratory system: fair air movement, mild exp wheezing and positive rhonchi (L > R). 2L [place] supplementation in place with good sat Cardiovascular system:Rate controlled, no rubs or gallops.  Gastrointestinal system: Abdomen is nondistended, soft and nontender. No organomegaly or masses felt. Normal bowel sounds heard. Central nervous system: No focal neurological deficits. Extremities: No Cyanosis, no clubbing. Skin: No petechiae. Psychiatry: Judgement and insight appear normal. Mood & affect appropriate.   Plan: -IR consultation for thoracentesis  -continue IV, bronchodilator management and low dose steroids -complete TIA/stroke work up and will discuss with neurology for further rec's  -mentation/neurologic exam demonstrating patient is back to baseline and no focal deficit appreciated. -follow clinical response -DNR/DNI status has been confirmed with patient.   Eric Nunnery MD [phone]

## 2024-10-29 NOTE — ED Provider Notes (Signed)
 " [facility] [facility]ENC[facility] DEPARTMENT AT [facility] Provider Note   CSN: [id number] Arrival date & time: 10/28/24  2321     Patient presents with: Weakness   Brandi Mcmahon is a 84 y.o. female.   Patient is an 84 year old female with past medical history of peripheral artery disease with stent, spinal stenosis, hypertension, and hypoxia for which Brandi Mcmahon is on home oxygen.  Patient presenting today with complaints of weakness.  Brandi Mcmahon reports a 1 week history of cough that is productive of green sputum and congestion.  This evening, her daughter was planning to bring her to the ER.  The patient went to the bathroom and was sitting on the toilet.  When Brandi Mcmahon stood up, Brandi Mcmahon had an episode where Brandi Mcmahon could not speak and the daughter noticed weakness on both sides of her body, right worse than left.  This episode lasted for approximately 10 minutes, then seem to resolve.  Brandi Mcmahon is now back to baseline and only complaints are of cough.       Prior to Admission medications  Medication Sig Start Date End Date Taking? Authorizing Provider  acetaminophen  (TYLENOL ) 500 MG tablet Take 500-1,000 mg by mouth every 6 (six) hours as needed for moderate pain. Patient not taking: Reported on 06/01/2024    [provider]  albuterol  (VENTOLIN  HFA) 108 (90 Base) MCG/ACT inhaler Inhale 1-2 puffs into the lungs 4 (four) times daily as needed for shortness of breath or wheezing. 05/07/20   [provider]  aspirin  EC 81 MG EC tablet Take 1 tablet (81 mg total) by mouth daily. Swallow whole. 02/12/22   Baglia, Corrina, PA-C  Cetirizine HCl (ZYRTEC ALLERGY) 10 MG CAPS Take 10 mg by mouth daily. Patient taking differently: Take 10 mg by mouth daily. As needed 05/21/17   [provider]  clopidogrel  (PLAVIX ) 75 MG tablet Take 75 mg by mouth daily.    [provider]  diazepam  (VALIUM ) 5 MG tablet Take 5 mg by mouth 3 (three) times daily as needed for anxiety. 02/27/20   [provider]   diphenhydramine -acetaminophen  (TYLENOL  PM) 25-500 MG TABS tablet Take 1-2 tablets by mouth at bedtime as needed (sleep / pain).    [provider]  docusate sodium  (COLACE) 100 MG capsule Take 100-200 mg by mouth at bedtime as needed for mild constipation.    [provider]  gabapentin  (NEURONTIN ) 100 MG capsule Take 100 mg by mouth 2 (two) times daily. 08/01/21   [provider]  lisinopril  (ZESTRIL ) 20 MG tablet Take 20 mg by mouth daily. 03/10/20   [provider]  melatonin 3 MG TABS tablet Take 3-6 mg by mouth at bedtime as needed (sleep).    [provider]  mometasone (NASONEX) 50 MCG/ACT nasal spray Place 2 sprays into the nose daily as needed (allergies). 01/18/20   [provider]  montelukast  (SINGULAIR ) 10 MG tablet Take 10 mg by mouth at bedtime. 03/11/20   [provider]  Multiple Vitamin (MULTI-VITAMIN) tablet Take 1 tablet by mouth daily.    [provider]  MYRBETRIQ  50 MG TB24 tablet Take 50 mg by mouth daily. 05/21/17   [provider]  Omega-3 Fatty Acids (FISH OIL  ULTRA) 1400 MG CAPS Take 1,400 mg by mouth daily.    [provider]  pantoprazole  (PROTONIX ) 40 MG tablet Take 1 tablet (40 mg total) by mouth daily. 06/01/24   Beather Delon Gibson, PA  polycarbophil (FIBERCON) 625 MG tablet Take 625 mg  by mouth in the morning and at bedtime. Patient not taking: Reported on 06/01/2024    [provider]  polyethylene glycol powder (GLYCOLAX /MIRALAX ) 17 GM/SCOOP powder Take by mouth once.    [provider]  rosuvastatin  (CRESTOR ) 20 MG tablet Take 20 mg by mouth daily. 04/03/22   [provider]  sucralfate  (CARAFATE ) 1 g tablet Take 1 tablet (1 g total) by mouth 2 (two) times daily. Try Pantoprazole  for 1 week before starting Carafate  02/10/23   Mansouraty, Aloha Raddle., MD    Allergies: Augmentin [amoxicillin-pot clavulanate], Clavulanic acid, Codeine, Alprazolam, Bee  venom, Sulfa antibiotics, Tramadol , Avelox [moxifloxacin], and Hydrocodone-acetaminophen     Review of Systems  All other systems reviewed and are negative.   Updated Vital Signs BP (!) 149/54   Pulse 98   Temp 98.3 F (36.8 C) (Oral)   Resp 19   SpO2 94%   Physical Exam Vitals and nursing note reviewed.  Constitutional:      General: Brandi Mcmahon is not in acute distress.    Appearance: Brandi Mcmahon is well-developed. Brandi Mcmahon is not diaphoretic.  HENT:     Head: Normocephalic and atraumatic.  Eyes:     Extraocular Movements: Extraocular movements intact.     Pupils: Pupils are equal, round, and reactive to light.  Cardiovascular:     Rate and Rhythm: Normal rate and regular rhythm.     Heart sounds: No murmur heard.    No friction rub. No gallop.  Pulmonary:     Effort: Pulmonary effort is normal. No respiratory distress.     Breath sounds: Normal breath sounds. No wheezing.  Abdominal:     General: Bowel sounds are normal. There is no distension.     Palpations: Abdomen is soft.     Tenderness: There is no abdominal tenderness.  Musculoskeletal:        General: Normal range of motion.     Cervical back: Normal range of motion and neck supple.  Skin:    General: Skin is warm and dry.  Neurological:     General: No focal deficit present.     Mental Status: Brandi Mcmahon is alert and oriented to person, place, and time.     Cranial Nerves: No cranial nerve deficit.     Motor: No weakness.     Coordination: Coordination normal.     Gait: Gait normal.     (all labs ordered are listed, but only abnormal results are displayed) Labs Reviewed - No data to display  EKG: EKG Interpretation Date/Time:  Saturday October 28 2024 23:33:35 EST Ventricular Rate:  100 PR Interval:  124 QRS Duration:  87 QT Interval:  340 QTC Calculation: 439 R Axis:   36  Text Interpretation: Sinus tachycardia Nonspecific repol abnormality, lateral leads No significant change since 10/28/2024 Confirmed by Geroldine Berg  [phone]) on 10/28/2024 11:37:23 PM  Radiology: No results found.   Procedures   Medications Ordered in the ED - No data to display                                  Medical Decision Making Amount and/or Complexity of Data Reviewed Labs: ordered. Radiology: ordered.  Risk Decision regarding hospitalization.   Patient is an 84 year old female presenting with a 1 week history of URI symptoms, then experienced an episode of weakness and expressive aphasia this evening.  Patient arrives here back to baseline with unremarkable neurologic examination.  Physical examination  does reveal some rales on the left lung, but is otherwise unremarkable.  Laboratory studies obtained including CBC, metabolic panel, pro time.  White count is 12.7, but laboratory studies otherwise unremarkable.  Respiratory panel is negative for RSV, flu, and COVID.  Chest x-ray shows extensive pneumonia within the left lung.  CT scan of the head showing no acute process.  Patient given IV Rocephin  and Zithromax  for the pneumonia.  Given the extent of the pneumonia combined with what ever episode Brandi Mcmahon experienced this evening, I feel as though admission for further observation and IV antibiotics is indicated.  I have spoken with the hospitalist who agrees to admit.     Final diagnoses:  None    ED Discharge Orders     None          Geroldine Berg, MD 10/29/24 [phone]  "

## 2024-10-29 NOTE — Progress Notes (Signed)
 SLP Cancellation Note  Patient Details Name: Brandi Mcmahon MRN: [id number] DOB: 1941/09/01   Cancelled treatment:       Reason Eval/Treat Not Completed: Fatigue/lethargy limiting ability to participate (Pt denies ongoing expressive aphasia or dysarthria, unable to assess 2/2 lethargy. Chart review states aphasia and dysarthria resolved spontenously. SLP will re-attempt tomorrow to ensure to acute ST needs and eval if appropriate.)   Krystale Rinkenberger L Ariz Terrones 10/29/2024, 2:55 PM

## 2024-10-29 NOTE — H&P (Addendum)
 " History and Physical  Brandi Mcmahon FMW:[id number] DOB: [birth year] DOA: 10/28/2024  Referring physician: Dr. Geroldine, EDP  PCP: Mavis Coy  Outpatient Specialists: GI, hematology oncology, vascular surgery. Patient coming from: Home  Chief Complaint: Cough, AMS.  HPI: Brandi Mcmahon is a 84 y.o. 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    This evening, as her daughter-in-law was preparing to bring her to the ER the patient was noted to be aphasic with facial droop and slurred speech witnessed by the patient's son and daughter-in-law.  EMS was activated.  This episode lasted about 10 minutes then resolved spontaneously.  In the ER, tachycardic 105, tachypneic 27, and hypoxic with O2 saturation of 87% on room air.  Chest x-ray revealed consolidation throughout the left lung concerning for pneumonia.  Possible left pleural effusion.  Emphysema/chronic changes.  Patient received IV azithromycin  and Rocephin  in the ER.  TRH, hospitalist service, was asked to admit for community-acquired pneumonia and TIA.  ED Course: Temperature 98.0, BP 157/70, pulse 93, respiration rate 26, O2 saturation 95% on 2 L.  Review of Systems: Review of systems as noted in the HPI. All other systems reviewed and are negative.   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   GERD (gastroesophageal reflux disease)    Hyperlipidemia    Hypertension    Shingles     Past Surgical History:  Procedure Laterality Date   AORTOGRAM N/A 02/09/2022   Procedure: AORTOGRAM WITH ILIAC ARTERIOGRAM;  Surgeon: Gretta Lonni JINNY, MD;  Location: [facility] OR;  Service: Vascular;  Laterality: N/A;   BREAST LUMPECTOMY Left 11/07/2009   for left DCIS   BUNIONECTOMY  2009   CARDIAC CATHETERIZATION     ~ 2004, reported results unremarkable and shoulder symptoms due to bursitis   COLONOSCOPY  2019   COLONOSCOPY WITH PROPOFOL  N/A 10/23/2020   Procedure: COLONOSCOPY WITH PROPOFOL ;  Surgeon: Wilhelmenia Aloha Raddle., MD;  Location: [facility];  Service: Gastroenterology;  Laterality: N/A;   ENDARTERECTOMY FEMORAL Right 02/09/2022   Procedure: RIGHT COMMON FEMORAL ENDARTERECTOMY WITH PROFUNDA PLASTY AND SUPERFICIAL FEMORAL ARTERY ENDARTERECTOMY;  Surgeon: Gretta Lonni JINNY, MD;  Location: [facility] OR;  Service: Vascular;  Laterality: Right;   HEMOSTASIS CLIP PLACEMENT  10/23/2020   Procedure: HEMOSTASIS CLIP PLACEMENT;  Surgeon: Wilhelmenia Aloha Raddle., MD;  Location: [facility];  Service: Gastroenterology;;   INSERTION OF ILIAC STENT Bilateral 02/09/2022   Procedure: BILATERAL COMMON ILIAC ANGIOPLASTY WITH STENTS;  Surgeon: Gretta Lonni JINNY, MD;  Location: [facility] OR;  Service: Vascular;  Laterality: Bilateral;   INTRAOPERATIVE ARTERIOGRAM Right 02/09/2022   Procedure: RIGHT LOWER EXTREMITY ARTERIOGRAM;  Surgeon: Gretta Lonni JINNY, MD;  Location: [facility] OR;  Service: Vascular;  Laterality: Right;   LUMBAR LAMINECTOMY/DECOMPRESSION MICRODISCECTOMY Right 06/18/2021   Procedure: Laminectomy and Foraminotomy - right - Lumbar four-Lumbar five for resection of synovial cyst;  Surgeon: Onetha Kuba, MD;  Location: [facility] OR;  Service: Neurosurgery;  Laterality: Right;   PATCH ANGIOPLASTY Right 02/09/2022  Procedure: PATCH ANGIOPLASTY USING XENOSURE 1cm x 14cm BIOLOGIC PATCH;  Surgeon: Gretta Lonni PARAS, MD;  Location: [facility] OR;  Service: Vascular;  Laterality: Right;   POLYPECTOMY  10/23/2020    Procedure: POLYPECTOMY;  Surgeon: Wilhelmenia Aloha Raddle., MD;  Location: [facility];  Service: Gastroenterology;;   SUBMUCOSAL LIFTING INJECTION  10/23/2020   Procedure: SUBMUCOSAL LIFTING INJECTION;  Surgeon: Wilhelmenia Aloha Raddle., MD;  Location: [facility];  Service: Gastroenterology;;   SUBMUCOSAL TATTOO INJECTION  10/23/2020   Procedure: SUBMUCOSAL TATTOO INJECTION;  Surgeon: Wilhelmenia Aloha Raddle., MD;  Location: [facility];  Service: Gastroenterology;;   TOTAL ABDOMINAL HYSTERECTOMY  1987   ULTRASOUND GUIDANCE FOR VASCULAR ACCESS Bilateral 02/09/2022   Procedure: ULTRASOUND GUIDANCE FOR VASCULAR ACCESS LEFT COMMON FEMORAL ARTERY;  Surgeon: Gretta Lonni PARAS, MD;  Location: [facility] OR;  Service: Vascular;  Laterality: Bilateral;    Social History:  reports that she has been smoking cigarettes. She has never used smokeless tobacco. She reports that she does not currently use alcohol . She reports that she does not use drugs.   Allergies[1]  Family History  Problem Relation Age of Onset   Breast cancer Mother    Lung cancer Father    Colon cancer Neg Hx    Colon polyps Neg Hx    Esophageal cancer Neg Hx    Rectal cancer Neg Hx    Stomach cancer Neg Hx    Inflammatory bowel disease Neg Hx    Liver disease Neg Hx    Pancreatic cancer Neg Hx       Prior to Admission medications  Medication Sig Start Date End Date Taking? Authorizing Provider  acetaminophen  (TYLENOL ) 500 MG tablet Take 500-1,000 mg by mouth every 6 (six) hours as needed for moderate pain. Patient not taking: Reported on 06/01/2024    [provider]  albuterol  (VENTOLIN  HFA) 108 (90 Base) MCG/ACT inhaler Inhale 1-2 puffs into the lungs 4 (four) times daily as needed for shortness of breath or wheezing. 05/07/20   [provider]  aspirin  EC 81 MG EC tablet Take 1 tablet (81 mg total) by mouth daily. Swallow whole. 02/12/22   Baglia, Corrina, PA-C  Cetirizine HCl (ZYRTEC ALLERGY) 10 MG CAPS Take  10 mg by mouth daily. Patient taking differently: Take 10 mg by mouth daily. As needed 05/21/17   [provider]  clopidogrel  (PLAVIX ) 75 MG tablet Take 75 mg by mouth daily.    [provider]  diazepam  (VALIUM ) 5 MG tablet Take 5 mg by mouth 3 (three) times daily as needed for anxiety. 02/27/20   [provider]  diphenhydramine -acetaminophen  (TYLENOL  PM) 25-500 MG TABS tablet Take 1-2 tablets by mouth at bedtime as needed (sleep / pain).    [provider]  docusate sodium  (COLACE) 100 MG capsule Take 100-200 mg by mouth at bedtime as needed for mild constipation.    [provider]  gabapentin  (NEURONTIN ) 100 MG capsule Take 100 mg by mouth 2 (two) times daily. 08/01/21   [provider]  lisinopril  (ZESTRIL ) 20 MG tablet Take 20 mg by mouth daily. 03/10/20   [provider]  melatonin 3 MG TABS tablet Take 3-6 mg by mouth at bedtime as needed (sleep).    [provider]  mometasone (NASONEX) 50 MCG/ACT nasal spray Place 2 sprays into the nose daily as needed (allergies). 01/18/20   [provider]  montelukast  (SINGULAIR ) 10 MG tablet Take 10 mg by mouth at bedtime. 03/11/20   [provider]  Multiple Vitamin (MULTI-VITAMIN) tablet Take 1 tablet by mouth daily.    [provider]  MYRBETRIQ  50 MG TB24 tablet Take 50 mg by mouth daily. 05/21/17   [provider]  Omega-3 Fatty Acids (FISH OIL  ULTRA) 1400 MG CAPS Take 1,400 mg by mouth daily.    [provider]  pantoprazole  (PROTONIX ) 40 MG tablet Take 1 tablet (40 mg total) by mouth daily. 06/01/24   Beather Delon Gibson, PA  polycarbophil (FIBERCON) 625 MG tablet Take 625 mg by mouth in the morning and at bedtime. Patient not taking: Reported on 06/01/2024    [provider]  polyethylene glycol powder (GLYCOLAX /MIRALAX ) 17 GM/SCOOP powder Take by mouth once.    [provider]  rosuvastatin  (CRESTOR ) 20 MG tablet  Take 20 mg by mouth daily. 04/03/22   [provider]  sucralfate  (CARAFATE ) 1 g tablet Take 1 tablet (1 g total) by mouth 2 (two) times daily. Try Pantoprazole  for 1 week before starting Carafate  02/10/23   Mansouraty, Aloha Raddle., MD    Physical Exam: BP (!) 135/53   Pulse 91   Temp 98.3 F (36.8 C) (Oral)   Resp (!) 26   SpO2 94%   General: 84 y.o. year-old female well developed well nourished in no acute distress.  Alert and oriented x3. Cardiovascular: Tachycardic with no rubs or gallops.  No thyromegaly or JVD noted.  No lower extremity edema bilaterally. Respiratory: Mild Rales at bases.  Poor inspiratory effort. Abdomen: Soft nontender nondistended with normal bowel sounds x4 quadrants. Muskuloskeletal: No cyanosis, clubbing or edema noted bilaterally Neuro: CN II-XII intact, strength, sensation, reflexes Skin: No ulcerative lesions noted or rashes Psychiatry: Judgement and insight appear normal. Mood is appropriate for condition and setting          Labs on Admission:  Basic Metabolic Panel: Recent Labs  Lab 10/28/24 2331  NA 137  K 3.7  CL 98  CO2 29  GLUCOSE 124*  BUN 14  CREATININE 0.93  CALCIUM  9.2   Liver Function Tests: No results for input(s): AST, ALT, ALKPHOS, BILITOT, PROT, ALBUMIN in the last 168 hours. No results for input(s): LIPASE, AMYLASE in the last 168 hours. No results for input(s): AMMONIA in the last 168 hours. CBC: Recent Labs  Lab 10/28/24 2331  WBC 12.7*  NEUTROABS 10.6*  HGB 9.0*  HCT 31.4*  MCV 75.5*  PLT 633*   Cardiac Enzymes: No results for input(s): CKTOTAL, CKMB, CKMBINDEX, TROPONINI in the last 168 hours.  BNP (last 3 results) No results for input(s): BNP in the last 8760 hours.  ProBNP (last 3 results) No results for input(s): PROBNP in the last 8760 hours.  CBG: No results for input(s): GLUCAP in the last 168 hours.  Radiological Exams on Admission: DG Chest 2 View Result  Date: 10/29/2024 EXAM: 2 VIEW(S) XRAY OF THE CHEST 10/29/2024 12:30:10 AM COMPARISON: Comparison 11/07/2009. CT 01/07/2022. CLINICAL HISTORY: cough FINDINGS: LUNGS AND PLEURA: Emphysema with hyperinflation. Consolidation throughout the left lung concerning for pneumonia. Underlying chronic lung disease/fibrosis. Possible left pleural effusion. No pneumothorax. HEART AND MEDIASTINUM: No acute abnormality of the cardiac and mediastinal silhouettes. BONES AND SOFT TISSUES: No acute osseous abnormality. IMPRESSION: 1. Consolidation throughout the left lung concerning for pneumonia. 2. Possible left pleural effusion. 3. Emphysema/chronic changes Electronically signed by: Franky Crease MD 10/29/2024 12:34 AM EST RP Workstation: [id number]   CT Head Wo Contrast Result Date: 10/29/2024 EXAM: CT HEAD WITHOUT 10/29/2024 12:23:26 AM TECHNIQUE: CT of the head was performed  without the administration of intravenous contrast. Automated exposure control, iterative reconstruction, and/or weight based adjustment of the mA/kV was utilized to reduce the radiation dose to as low as reasonably achievable. COMPARISON: None available. CLINICAL HISTORY: Neuro deficit, acute, stroke suspected Neuro deficit, acute, stroke suspected FINDINGS: BRAIN AND VENTRICLES: No acute intracranial hemorrhage. No mass effect or midline shift. No extra-axial fluid collection. No evidence of acute infarct. No hydrocephalus. ORBITS: No acute abnormality. SINUSES AND MASTOIDS: No acute abnormality. SOFT TISSUES AND SKULL: No acute skull fracture. No acute soft tissue abnormality. IMPRESSION: 1. No acute intracranial abnormality. Electronically signed by: Franky Crease MD 10/29/2024 12:27 AM EST RP Workstation: [id number]    EKG: I independently viewed the EKG done and my findings are as followed: Sinus tachycardia rate of 100.  QTc 439.  Assessment/Plan Present on Admission:  CAP (community acquired pneumonia)  Principal Problem:   CAP (community  acquired pneumonia)  Community-acquired pneumonia, POA Continue Rocephin  and azithromycin  Follow sputum culture and peripheral blood cultures x 2 DuoNebs every 6 hours and every 2 hours as needed As needed antitussives Monitor fever curve and WBCs. Follow noncontrast CT scan of the chest.  TIA versus acute CVA The patient on Plavix  75 mg daily and Crestor  20 mg daily prior to admission. Follow-up MRI brain, CT angio head and neck. Follow transthoracic echocardiogram. Frequent neurochecks Fasting lipid panel, A1c PT/OT/speech therapist evaluation  Coronary artery disease Peripheral artery disease Resume home regimen No anginal symptoms was reported  Hyperlipidemia Resume home regimen.  Former smoker Encouraged to continue to abstain from tobacco use. Quit smoking in September 2025. Started smoking at the age of 2, on and off.  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 Hemoglobin 9.0, MCV 75. Continue to monitor CBC  Ambulatory dysfunction Ambulates with a walker PT OT evaluation Fall precautions   GERD Resume home PPI   Critical care time: 55 minutes.   DVT prophylaxis: Subcu Lovenox  daily.  Code Status: Full code.  Family Communication: None at bedside.  Disposition Plan: Admitted to telemetry unit.  Consults called: None.  Admission status: Inpatient status.   Status is: Inpatient The patient requires at least 2 midnight for further evaluation and treatment of present condition.   Terry LOISE Hurst MD [facility] Pager [phone]  If 7PM-7AM, please contact night-coverage [email or web address] Password [email or web address]  10/29/2024, 2:00 AM      [1]  Allergies Allergen Reactions   Augmentin [Amoxicillin-Pot Clavulanate] Other (See Comments)    ABDONIMAL  PAIN    Clavulanic Acid Hives   Codeine Hives and Other (See Comments)   Alprazolam Other (See Comments)    OVER SEDATES    Bee Venom Hives   Sulfa Antibiotics Rash and Other (See Comments)   Tramadol      Insomnia    Avelox [Moxifloxacin] Rash   Hydrocodone-Acetaminophen  Other (See Comments)    Insomnia    "

## 2024-10-29 NOTE — Evaluation (Signed)
 Physical Therapy Evaluation Patient Details Name: Brandi Mcmahon MRN: [id number] DOB: Jun 28, 1941 Today's Date: 10/29/2024  History of Present Illness  Brandi Mcmahon is a 84 y.o. 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       This evening, as her daughter-in-law was preparing to bring her to the ER the patient was noted to be aphasic with facial droop and slurred speech witnessed by the patient's son and daughter-in-law.  EMS was activated.  This episode lasted about 10 minutes then resolved spontaneously.   Clinical Impression  Patient required HOB partially raised for sitting up at beside, once seated able to put on her socks with increased time, unsteady on feet with near loss of balance mostly due to c/o pain on top of left foot, after pain resolved patient able to ambulate at bedside but limited due to c/o fatigue and weakness. Patient tolerated sitting up on [facility] after therapy - NT notified. Patient will benefit from continued skilled physical therapy in hospital and recommended venue below to increase strength, balance, endurance for safe ADLs and gait.          If plan is discharge home, recommend the following: A little help with walking and/or transfers;A little help with bathing/dressing/bathroom;Help with stairs or ramp for entrance;Assist for transportation;Assistance with cooking/housework   Can travel by private vehicle        Equipment Recommendations None recommended by PT  Recommendations for Other Services       Functional Status Assessment  Patient has had a recent decline in their functional status and demonstrates the ability to make significant improvements in function in a reasonable and predictable amount of time.     Precautions / Restrictions Precautions Precautions: Fall Recall of Precautions/Restrictions: Intact Restrictions Weight Bearing Restrictions Per Provider Order: No      Mobility  Bed Mobility Overal bed mobility: Needs Assistance Bed Mobility: Supine to Sit     Supine to sit: Contact guard, HOB elevated     General bed mobility comments: increased time, labored movement    Transfers Overall transfer level: Needs assistance Equipment used: Rolling walker (2 wheels), 1 person hand held assist Transfers: Sit to/from Stand, Bed to chair/wheelchair/BSC Sit to Stand: Min assist   Step pivot transfers: Min assist       General transfer comment: unsteady labored movement with c/o pain on top of left foot than resolved after a few minutes    Ambulation/Gait Ambulation/Gait assistance: Min assist Gait Distance (Feet): 10 Feet Assistive device: Rolling walker (2 wheels) Gait Pattern/deviations: Decreased step length - right, Decreased step length - left, Decreased stride length, Staggering right, Staggering left Gait velocity: slow     General Gait Details: slow labored movement with occasional staggering right/left, but able to self correct without loss of balance  Stairs            Wheelchair Mobility     Tilt Bed    Modified Rankin (Stroke Patients Only)       Balance Overall balance assessment: Needs assistance Sitting-balance support: Feet supported, No upper extremity supported Sitting balance-Leahy Scale: Fair Sitting balance - Comments: fair/good seated at EOB  Standing balance support: Reliant on assistive device for balance, During functional activity, Bilateral upper extremity supported Standing balance-Leahy Scale: Poor Standing balance comment: fair/poor using  RW                             Pertinent Vitals/Pain Pain Assessment Pain Assessment: Faces Faces Pain Scale: Hurts little more Pain Location: chronic low back pain Pain Descriptors / Indicators: Aching, Sore Pain Intervention(s): Limited activity within patient's tolerance, Monitored during session, Repositioned    Home Living Family/patient expects to be discharged to:: Private residence Living Arrangements: Children Available Help at Discharge: Family Type of Home: Mobile home Home Access: Stairs to enter Entrance Stairs-Rails: Right Entrance Stairs-Number of Steps: 4   Home Layout: One level Home Equipment: Cane - single point;Standard Building Surveyor (2 wheels)      Prior Function Prior Level of Function : Needs assist       Physical Assist : Mobility (physical);ADLs (physical) Mobility (physical): Bed mobility;Transfers;Gait;Stairs   Mobility Comments: household ambulation using RW, on 2 LPM constant at home ADLs Comments: Assisted by family     Extremity/Trunk Assessment   Upper Extremity Assessment Upper Extremity Assessment: Defer to OT evaluation    Lower Extremity Assessment Lower Extremity Assessment: Generalized weakness    Cervical / Trunk Assessment Cervical / Trunk Assessment: Kyphotic  Communication   Communication Communication: No apparent difficulties    Cognition Arousal: Alert Behavior During Therapy: WFL for tasks assessed/performed                             Following commands: Intact       Cueing Cueing Techniques: Verbal cues     General Comments      Exercises     Assessment/Plan    PT Assessment Patient needs continued PT services  PT Problem List Decreased strength;Decreased activity tolerance;Decreased balance;Decreased mobility       PT Treatment Interventions DME instruction;Gait training;Stair training;Functional mobility training;Therapeutic activities;Therapeutic  exercise;Balance training;Patient/family education    PT Goals (Current goals can be found in the Care Plan section)  Acute Rehab PT Goals Patient Stated Goal: return home with family to assist PT Goal Formulation: With patient Time For Goal Achievement: 11/03/24 Potential to Achieve Goals: Good    Frequency Min 3X/week     Co-evaluation               AM-PAC PT 6 Clicks Mobility  Outcome Measure Help needed turning from your back to your side while in a flat bed without using bedrails?: None Help needed moving from lying on your back to sitting on the side of a flat bed without using bedrails?: A Little Help needed moving to and from a bed to a chair (including a wheelchair)?: A Little Help needed standing up from a chair using your arms (e.g., wheelchair or bedside chair)?: A Little Help needed to walk in hospital room?: A Lot Help needed climbing 3-5 steps with a railing? : A Lot 6 Click Score: 17    End of Session Equipment Utilized During Treatment: Oxygen Activity Tolerance: Patient tolerated treatment well;Patient limited by fatigue Patient left: Other (comment) (left seated on [facility]) Nurse Communication: Mobility status PT Visit Diagnosis: Unsteadiness on feet (R26.81);Other abnormalities of gait and mobility (R26.89);Muscle weakness (generalized) (M62.81)    Time: 8849-8782 PT Time Calculation (min) (ACUTE ONLY): 27 min   Charges:   PT Evaluation $PT  Eval Moderate Complexity: 1 Mod PT Treatments $Therapeutic Activity: 23-37 mins PT General Charges $$ ACUTE PT VISIT: 1 Visit         2:38 PM, 10/29/2024 Lynwood Music, MPT Physical Therapist with [facility] [phone] office [phone] mobile phone

## 2024-10-29 NOTE — ED Notes (Addendum)
 ED TO INPATIENT HANDOFF REPORT  ED Nurse Name and Phone #: [phone]  S Name/Age/Gender Brandi Mcmahon 84 y.o. female Room/Bed: APA04/APA04  Code Status   Code Status: Full Code  Home/SNF/Other Home Patient oriented to: self, place, time, and situation Is this baseline? Yes   Triage Complete: Triage complete  Chief Complaint CAP (community acquired pneumonia) [J18.9]  Triage Note Pt daughter states pt went to restroom around 2215 when she started having bilateral weakness, and was slow to respond. Per daughter this episode lasted around 10 minutes, pt is now back to her baseline. Pt has also had congestion and cough x1 week    Allergies Allergies[1]  Level of Care/Admitting Diagnosis ED Disposition     ED Disposition  Admit   Condition  --   Comment  Hospital Area: [facility] [[id number]]  Level of Care: Telemetry [5]  Diagnosis: CAP (community acquired pneumonia) [659315]  Admitting Physician: SHONA TERRY SAILOR [[id number]]  Attending Physician: SHONA TERRY SAILOR [[id number]]  Certification:: I certify this patient will need inpatient services for at least 2 midnights  Expected Medical Readiness: 10/31/2024          B Medical/Surgery History 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   GERD (gastroesophageal reflux disease)    Hyperlipidemia    Hypertension    Shingles    Past Surgical History:  Procedure Laterality Date   AORTOGRAM N/A 02/09/2022   Procedure: AORTOGRAM WITH ILIAC ARTERIOGRAM;  Surgeon: Gretta Lonni JINNY, MD;  Location: [facility] OR;  Service: Vascular;  Laterality: N/A;   BREAST LUMPECTOMY Left 11/07/2009   for left DCIS   BUNIONECTOMY  2009   CARDIAC CATHETERIZATION     ~ 2004, reported results unremarkable and shoulder symptoms due to bursitis   COLONOSCOPY  2019   COLONOSCOPY WITH PROPOFOL  N/A 10/23/2020    Procedure: COLONOSCOPY WITH PROPOFOL ;  Surgeon: Wilhelmenia Aloha Raddle., MD;  Location: [facility];  Service: Gastroenterology;  Laterality: N/A;   ENDARTERECTOMY FEMORAL Right 02/09/2022   Procedure: RIGHT COMMON FEMORAL ENDARTERECTOMY WITH PROFUNDA PLASTY AND SUPERFICIAL FEMORAL ARTERY ENDARTERECTOMY;  Surgeon: Gretta Lonni JINNY, MD;  Location[facility];  Service: Vascular;  Laterality: Right;   HEMOSTASIS CLIP PLACEMENT  10/23/2020   Procedure: HEMOSTASIS CLIP PLACEMENT;  Surgeon: Wilhelmenia Aloha Raddle., MD;  Location: [facility];  Service: Gastroenterology;;   INSERTION OF ILIAC STENT Bilateral 02/09/2022   Procedure: BILATERAL COMMON ILIAC ANGIOPLASTY WITH STENTS;  Surgeon: Gretta Lonni JINNY, MD;  Location: [facility] OR;  Service: Vascular;  Laterality: Bilateral;   INTRAOPERATIVE ARTERIOGRAM Right 02/09/2022   Procedure: RIGHT LOWER EXTREMITY ARTERIOGRAM;  Surgeon: Gretta Lonni JINNY, MD;  Location: [facility] OR;  Service: Vascular;  Laterality: Right;   LUMBAR LAMINECTOMY/DECOMPRESSION MICRODISCECTOMY Right 06/18/2021   Procedure: Laminectomy and Foraminotomy - right - Lumbar four-Lumbar five for resection of synovial cyst;  Surgeon: Onetha Kuba, MD;  Location: [facility] OR;  Service: Neurosurgery;  Laterality: Right;   PATCH ANGIOPLASTY Right 02/09/2022   Procedure: PATCH ANGIOPLASTY USING XENOSURE 1cm x 14cm BIOLOGIC PATCH;  Surgeon: Gretta Lonni JINNY, MD;  Location: [facility] OR;  Service: Vascular;  Laterality: Right;   POLYPECTOMY  10/23/2020   Procedure: POLYPECTOMY;  Surgeon: Wilhelmenia Aloha Raddle., MD;  Location: [facility];  Service: Gastroenterology;;   SUBMUCOSAL LIFTING INJECTION  10/23/2020   Procedure: SUBMUCOSAL LIFTING  INJECTION;  Surgeon: Wilhelmenia Aloha Raddle., MD;  Location: [facility];  Service: Gastroenterology;;   SUBMUCOSAL TATTOO INJECTION  10/23/2020   Procedure: SUBMUCOSAL TATTOO INJECTION;  Surgeon: Wilhelmenia Aloha Raddle., MD;  Location: [facility];  Service:  Gastroenterology;;   TOTAL ABDOMINAL HYSTERECTOMY  1987   ULTRASOUND GUIDANCE FOR VASCULAR ACCESS Bilateral 02/09/2022   Procedure: ULTRASOUND GUIDANCE FOR VASCULAR ACCESS LEFT COMMON FEMORAL ARTERY;  Surgeon: Gretta Lonni PARAS, MD;  Location: [facility] OR;  Service: Vascular;  Laterality: Bilateral;     A IV Location/Drains/Wounds Patient Lines/Drains/Airways Status     Active Line/Drains/Airways     Name Placement date Placement time Site Days   Peripheral IV 01/07/22 20 G Left;Posterior Forearm 01/07/22  1520  Forearm  1026   Peripheral IV 10/28/24 20 G Anterior;Right Forearm 10/28/24  2337  Forearm  1            Intake/Output Last 24 hours No intake or output data in the 24 hours ending 10/29/24 0218  Labs/Imaging Results for orders placed or performed during the hospital encounter of 10/28/24 (from the past 48 hours)  Basic metabolic panel     Status: Abnormal   Collection Time: 10/28/24 11:31 PM  Result Value Ref Range   Sodium 137 135 - 145 mmol/L   Potassium 3.7 3.5 - 5.1 mmol/L   Chloride 98 98 - 111 mmol/L   CO2 29 22 - 32 mmol/L   Glucose, Bld 124 (H) 70 - 99 mg/dL    Comment: Glucose reference range applies only to samples taken after fasting for at least 8 hours.   BUN 14 8 - 23 mg/dL   Creatinine, Ser 9.06 0.44 - 1.00 mg/dL   Calcium  9.2 8.9 - 10.3 mg/dL   GFR, Estimated >39 >39 mL/min    Comment: (NOTE) Calculated using the CKD-EPI Creatinine Equation (2021)    Anion gap 11 5 - 15    Comment: Performed at [facility], [address] [place], [place] [address]  CBC with Differential     Status: Abnormal   Collection Time: 10/28/24 11:31 PM  Result Value Ref Range   WBC 12.7 (H) 4.0 - 10.5 K/uL   RBC 4.16 3.87 - 5.11 MIL/uL   Hemoglobin 9.0 (L) 12.0 - 15.0 g/dL   HCT 68.5 (L) 63.9 - 53.9 %   MCV 75.5 (L) 80.0 - 100.0 fL   MCH 21.6 (L) 26.0 - 34.0 pg   MCHC 28.7 (L) 30.0 - 36.0 g/dL   RDW 79.0 (H) 88.4 - 84.4 %   Platelets 633 (H) 150 - 400 K/uL   nRBC  0.0 0.0 - 0.2 %   Neutrophils Relative % 83 %   Neutro Abs 10.6 (H) 1.7 - 7.7 K/uL   Lymphocytes Relative 10 %   Lymphs Abs 1.3 0.7 - 4.0 K/uL   Monocytes Relative 6 %   Monocytes Absolute 0.8 0.1 - 1.0 K/uL   Eosinophils Relative 0 %   Eosinophils Absolute 0.0 0.0 - 0.5 K/uL   Basophils Relative 0 %   Basophils Absolute 0.1 0.0 - 0.1 K/uL   Immature Granulocytes 1 %   Abs Immature Granulocytes 0.06 0.00 - 0.07 K/uL    Comment: Performed at [facility], [address] [place], [place] [address]  Protime-INR     Status: None   Collection Time: 10/28/24 11:31 PM  Result Value Ref Range   Prothrombin Time 14.8 11.4 - 15.2 seconds   INR 1.1 0.8 - 1.2    Comment: (  NOTE) INR goal varies based on device and disease states. Performed at [facility], [address] [place], [place] [address]   Resp panel by RT-PCR (RSV, Flu A&B, Covid) Anterior Nasal Swab     Status: None   Collection Time: 10/29/24 12:04 AM   Specimen: Anterior Nasal Swab  Result Value Ref Range   SARS Coronavirus 2 by RT PCR NEGATIVE NEGATIVE    Comment: (NOTE) SARS-CoV-2 target nucleic acids are NOT DETECTED.  The SARS-CoV-2 RNA is generally detectable in upper respiratory specimens during the acute phase of infection. The lowest concentration of SARS-CoV-2 viral copies this assay can detect is 138 copies/mL. A negative result does not preclude SARS-Cov-2 infection and should not be used as the sole basis for treatment or other patient management decisions. A negative result may occur with  improper specimen collection/handling, submission of specimen other than nasopharyngeal swab, presence of viral mutation(s) within the areas targeted by this assay, and inadequate number of viral copies(<138 copies/mL). A negative result must be combined with clinical observations, patient history, and epidemiological information. The expected result is Negative.  Fact Sheet for Patients:   bloggercourse.com  Fact Sheet for Healthcare Providers:  seriousbroker.it  This test is no t yet approved or cleared by the [place]  FDA and  has been authorized for detection and/or diagnosis of SARS-CoV-2 by FDA under an Emergency Use Authorization (EUA). This EUA will remain  in effect (meaning this test can be used) for the duration of the COVID-19 declaration under Section 564(b)(1) of the Act, 21 U.S.C.section 360bbb-3(b)(1), unless the authorization is terminated  or revoked sooner.       Influenza A by PCR NEGATIVE NEGATIVE   Influenza B by PCR NEGATIVE NEGATIVE    Comment: (NOTE) The Xpert Xpress SARS-CoV-2/FLU/RSV plus assay is intended as an aid in the diagnosis of influenza from Nasopharyngeal swab specimens and should not be used as a sole basis for treatment. Nasal washings and aspirates are unacceptable for Xpert Xpress SARS-CoV-2/FLU/RSV testing.  Fact Sheet for Patients: bloggercourse.com  Fact Sheet for Healthcare Providers: seriousbroker.it  This test is not yet approved or cleared by the [place]  FDA and has been authorized for detection and/or diagnosis of SARS-CoV-2 by FDA under an Emergency Use Authorization (EUA). This EUA will remain in effect (meaning this test can be used) for the duration of the COVID-19 declaration under Section 564(b)(1) of the Act, 21 U.S.C. section 360bbb-3(b)(1), unless the authorization is terminated or revoked.     Resp Syncytial Virus by PCR NEGATIVE NEGATIVE    Comment: (NOTE) Fact Sheet for Patients: bloggercourse.com  Fact Sheet for Healthcare Providers: seriousbroker.it  This test is not yet approved or cleared by the [place]  FDA and has been authorized for detection and/or diagnosis of SARS-CoV-2 by FDA under an Emergency Use Authorization (EUA).  This EUA will remain in effect (meaning this test can be used) for the duration of the COVID-19 declaration under Section 564(b)(1) of the Act, 21 U.S.C. section 360bbb-3(b)(1), unless the authorization is terminated or revoked.  Performed at [facility], [address], [place], [place] [address]    DG Chest 2 View Result Date: 10/29/2024 EXAM: 2 VIEW(S) XRAY OF THE CHEST 10/29/2024 12:30:10 AM COMPARISON: Comparison 11/07/2009. CT 01/07/2022. CLINICAL HISTORY: cough FINDINGS: LUNGS AND PLEURA: Emphysema with hyperinflation. Consolidation throughout the left lung concerning for pneumonia. Underlying chronic lung disease/fibrosis. Possible left pleural effusion. No pneumothorax. HEART AND MEDIASTINUM: No acute abnormality of the cardiac and mediastinal silhouettes. BONES  AND SOFT TISSUES: No acute osseous abnormality. IMPRESSION: 1. Consolidation throughout the left lung concerning for pneumonia. 2. Possible left pleural effusion. 3. Emphysema/chronic changes Electronically signed by: Franky Crease MD 10/29/2024 12:34 AM EST RP Workstation: [id number]   CT Head Wo Contrast Result Date: 10/29/2024 EXAM: CT HEAD WITHOUT 10/29/2024 12:23:26 AM TECHNIQUE: CT of the head was performed without the administration of intravenous contrast. Automated exposure control, iterative reconstruction, and/or weight based adjustment of the mA/kV was utilized to reduce the radiation dose to as low as reasonably achievable. COMPARISON: None available. CLINICAL HISTORY: Neuro deficit, acute, stroke suspected Neuro deficit, acute, stroke suspected FINDINGS: BRAIN AND VENTRICLES: No acute intracranial hemorrhage. No mass effect or midline shift. No extra-axial fluid collection. No evidence of acute infarct. No hydrocephalus. ORBITS: No acute abnormality. SINUSES AND MASTOIDS: No acute abnormality. SOFT TISSUES AND SKULL: No acute skull fracture. No acute soft tissue abnormality. IMPRESSION: 1. No acute intracranial abnormality.  Electronically signed by: Franky Crease MD 10/29/2024 12:27 AM EST RP Workstation: [id number]    Pending Labs Unresulted Labs (From admission, onward)     Start     Ordered   11/05/24 0500  Creatinine, serum  (enoxaparin  (LOVENOX )    CrCl >/= 30 ml/min)  Weekly,   R     Comments: while on enoxaparin  therapy    10/29/24 0201            Vitals/Pain Today's Vitals   10/28/24 2335 10/29/24 0045 10/29/24 0100 10/29/24 0145  BP:  (!) 137/54 (!) 135/53 129/63  Pulse: 98 93 91 88  Resp: 19 (!) 26 (!) 26 (!) 23  Temp: 98.3 F (36.8 C)     TempSrc: Oral     SpO2: 94% 93% 94% 92%  PainSc:        Isolation Precautions No active isolations  Medications Medications  cefTRIAXone  (ROCEPHIN ) 1 g in sodium chloride  0.9 % 100 mL IVPB (1 g Intravenous New Bag/Given 10/29/24 0202)  azithromycin  (ZITHROMAX ) 500 mg in sodium chloride  0.9 % 250 mL IVPB (has no administration in time range)  enoxaparin  (LOVENOX ) injection 40 mg (has no administration in time range)  acetaminophen  (TYLENOL ) tablet 500 mg (has no administration in time range)  prochlorperazine  (COMPAZINE ) injection 5 mg (has no administration in time range)  polyethylene glycol (MIRALAX  / GLYCOLAX ) packet 17 g (has no administration in time range)  melatonin tablet 6 mg (has no administration in time range)    Mobility walks with device        NIH Stroke Scale  Dizziness Present: No Headache Present: No Interval: Initial Level of Consciousness (1a.)   : Alert, keenly responsive LOC Questions (1b. )   : Answers both questions correctly LOC Commands (1c. )   : Performs both tasks correctly Best Gaze (2. )  : Normal Visual (3. )  : No visual loss Facial Palsy (4. )    : Normal symmetrical movements Motor Arm, Left (5a. )   : No drift Motor Arm, Right (5b. ) : No drift Motor Leg, Left (6a. )  : No drift Motor Leg, Right (6b. ) : No drift Limb Ataxia (7. ): Absent Sensory (8. )  : Normal, no sensory loss Best Language  (9. )  : No aphasia Dysarthria (10. ): Normal Extinction/Inattention (11.)   : No Abnormality Complete NIHSS TOTAL: 0     Neuro Assessment: Within Defined Limits Neuro Checks:   Initial (10/28/24 2335)  Has TPA been given? No If patient is a  Neuro Trauma and patient is going to OR before floor call report to 4N Charge nurse: [phone] or [phone]   R Recommendations: See Admitting Provider Note  Report given to:   Additional Notes:        [1]  Allergies Allergen Reactions   Augmentin [Amoxicillin-Pot Clavulanate] Other (See Comments)    ABDONIMAL PAIN    Clavulanic Acid Hives   Codeine Hives and Other (See Comments)   Alprazolam Other (See Comments)    OVER SEDATES    Bee Venom Hives   Sulfa Antibiotics Rash and Other (See Comments)   Tramadol      Insomnia    Avelox [Moxifloxacin] Rash   Hydrocodone-Acetaminophen  Other (See Comments)    Insomnia

## 2024-10-29 NOTE — Plan of Care (Signed)
" °  Problem: Acute Rehab PT Goals(only PT should resolve) Goal: Pt Will Go Supine/Side To Sit Outcome: Progressing Flowsheets (Taken 10/29/2024 1439) Pt will go Supine/Side to Sit: with modified independence Goal: Patient Will Transfer Sit To/From Stand Outcome: Progressing Flowsheets (Taken 10/29/2024 1439) Patient will transfer sit to/from stand:  with supervision  with contact guard assist Goal: Pt Will Transfer Bed To Chair/Chair To Bed Outcome: Progressing Flowsheets (Taken 10/29/2024 1439) Pt will Transfer Bed to Chair/Chair to Bed:  with supervision  with contact guard assist Goal: Pt Will Ambulate Outcome: Progressing Flowsheets (Taken 10/29/2024 1439) Pt will Ambulate:  50 feet  with contact guard assist  with minimal assist  with rolling walker   2:39 PM, 10/29/2024 Lynwood Music, MPT Physical Therapist with [facility] [phone] office [phone] mobile phone  "

## 2024-10-30 ENCOUNTER — Encounter (HOSPITAL_COMMUNITY): Payer: Self-pay | Admitting: Internal Medicine

## 2024-10-30 ENCOUNTER — Inpatient Hospital Stay (HOSPITAL_COMMUNITY)

## 2024-10-30 DIAGNOSIS — E782 Mixed hyperlipidemia: Secondary | ICD-10-CM

## 2024-10-30 DIAGNOSIS — D472 Monoclonal gammopathy: Secondary | ICD-10-CM | POA: Diagnosis not present

## 2024-10-30 DIAGNOSIS — K219 Gastro-esophageal reflux disease without esophagitis: Secondary | ICD-10-CM | POA: Diagnosis not present

## 2024-10-30 DIAGNOSIS — J189 Pneumonia, unspecified organism: Secondary | ICD-10-CM | POA: Diagnosis not present

## 2024-10-30 DIAGNOSIS — I10 Essential (primary) hypertension: Secondary | ICD-10-CM | POA: Diagnosis not present

## 2024-10-30 DIAGNOSIS — J441 Chronic obstructive pulmonary disease with (acute) exacerbation: Secondary | ICD-10-CM

## 2024-10-30 DIAGNOSIS — J9611 Chronic respiratory failure with hypoxia: Secondary | ICD-10-CM

## 2024-10-30 DIAGNOSIS — I671 Cerebral aneurysm, nonruptured: Secondary | ICD-10-CM | POA: Diagnosis not present

## 2024-10-30 DIAGNOSIS — E785 Hyperlipidemia, unspecified: Secondary | ICD-10-CM

## 2024-10-30 DIAGNOSIS — G459 Transient cerebral ischemic attack, unspecified: Secondary | ICD-10-CM

## 2024-10-30 LAB — BODY FLUID CELL COUNT WITH DIFFERENTIAL
Eos, Fluid: 0 %
Lymphs, Fluid: 96 %
Monocyte-Macrophage-Serous Fluid: 3 % — ABNORMAL LOW (ref 50–90)
Neutrophil Count, Fluid: 1 % (ref 0–25)
Total Nucleated Cell Count, Fluid: 477 uL (ref 0–1000)

## 2024-10-30 LAB — GRAM STAIN: Gram Stain: NONE SEEN

## 2024-10-30 MED ORDER — ASPIRIN 81 MG PO TBEC
81.0000 mg | DELAYED_RELEASE_TABLET | Freq: Every day | ORAL | Status: DC
  Administered 2024-10-30 – 2024-11-07 (×9): 81 mg via ORAL
  Filled 2024-10-30 (×9): qty 1

## 2024-10-30 MED ORDER — LIDOCAINE HCL (PF) 2 % IJ SOLN
10.0000 mL | Freq: Once | INTRAMUSCULAR | Status: AC
  Administered 2024-10-30: 10 mL via INTRADERMAL

## 2024-10-30 MED ORDER — LIDOCAINE HCL (PF) 2 % IJ SOLN
INTRAMUSCULAR | Status: AC
  Filled 2024-10-30: qty 10

## 2024-10-30 NOTE — Assessment & Plan Note (Signed)
"  Continue PPI  "

## 2024-10-30 NOTE — Progress Notes (Signed)
 Mobility Specialist Progress Note:    10/30/24 1430  Mobility  Activity Pivoted/transferred from chair to bed  Level of Assistance Contact guard assist, steadying assist  Assistive Device None  Distance Ambulated (ft) 3 ft  Range of Motion/Exercises Active;All extremities  Activity Response Tolerated well  Mobility Referral Yes  Mobility visit 1 Mobility  Mobility Specialist Start Time (ACUTE ONLY) 1430  Mobility Specialist Stop Time (ACUTE ONLY) 1450  Mobility Specialist Time Calculation (min) (ACUTE ONLY) 20 min   Pt received in chair, refused to ambulate but requesting assistance back to bed. Required CGA to stand and transfer with no AD. Tolerated well, asx throughout. Family in room, all needs met.  Kedra Mcglade Mobility Specialist Please contact via Special Educational Needs Teacher or  Rehab office at [phone]

## 2024-10-30 NOTE — TOC Initial Note (Signed)
 Transition of Care [facility]) - Initial/Assessment Note    Patient Details  Name: Brandi Mcmahon MRN: [id number] Date of Birth: 1941-07-02  [facility]) CM/SW Contact:    Sharlyne Stabs, RN Phone Number: 10/30/2024, 1:47 PM  Clinical Narrative:           Patient admitted with pneumonia. PT is recommending HHPT.  Son at the bedside. States patient is active with [facility], MD aware to place resumption orders. She live she him and his wife, she has all DME needed in the home. They provide transportation. IPCM following, planning to discharge home in 1-2 days        Expected Discharge Plan: Home w Home Health Services Barriers to Discharge: Continued Medical Work up   Patient Goals and CMS Choice Patient states their goals for this hospitalization and ongoing recovery are:: return home CMS Medicare.gov Compare Post Acute Care list provided to:: Patient Represenative (must comment) Choice offered to / list presented to : Adult Children [place] ownership interest in [facility]provided to:: Adult Children    Expected Discharge Plan and Services     Post Acute Care Choice: Home Health Living arrangements for the past 2 months: Single Family Home                       HH Agency: [facility]     Prior Living Arrangements/Services Living arrangements for the past 2 months: Single Family Home Lives with:: Adult Children Patient language and need for interpreter reviewed:: Yes Do you feel safe going back to the place where you live?: Yes      Need for Family Participation in Patient Care: Yes (Comment) Care giver support system in place?: Yes (comment) Current home services: DME Criminal Activity/Legal Involvement Pertinent to Current Situation/Hospitalization: No - Comment as needed  Activities of Daily Living   ADL Screening (condition at time of admission) Independently performs ADLs?: Yes (appropriate for developmental age) Is  the patient deaf or have difficulty hearing?: No Does the patient have difficulty seeing, even when wearing glasses/contacts?: No Does the patient have difficulty concentrating, remembering, or making decisions?: No  Permission Sought/Granted          Permission granted to share info w Relationship: SON     Emotional Assessment     Affect (typically observed): Accepting Orientation: : Oriented to Self, Oriented to Place, Oriented to Situation Alcohol  / Substance Use: Not Applicable Psych Involvement: No (comment)  Admission diagnosis:  Weakness [R53.1] CAP (community acquired pneumonia) [J18.9] Community acquired pneumonia of left lung, unspecified part of lung [J18.9] Patient Active Problem List   Diagnosis Date Noted   CAP (community acquired pneumonia) 10/29/2024   History of diarrhea 06/27/2022   History of colon polyps 06/27/2022   Anorexia 06/27/2022   Weight loss, unintentional 06/27/2022   Antiplatelet or antithrombotic long-term use 06/27/2022   Peripheral arterial disease 02/09/2022   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 [place] [address] Phone: [phone] Fax: [phone]  [id number] [place] [place] - [address] [place] [address] [place] [address] Phone: [phone] Fax: [phone]     Social Drivers of Health (SDOH) Social History: SDOH Screenings  Food Insecurity: No Food Insecurity (10/29/2024)  Housing: Low Risk (10/29/2024)  Transportation Needs: No Transportation Needs (10/29/2024)  Utilities: Not At Risk (10/29/2024)  Financial Resource Strain: Low Risk (10/12/2024)   Received from [facility]  Physical Activity: Inactive (10/12/2024)   [facility] from [facility]   Social Connections: Moderately Isolated (10/29/2024)  Stress: No Stress Concern Present (10/12/2024)   Received from [facility]  Tobacco Use: Medium Risk (10/30/2024)  Health Literacy: Low Risk (10/12/2024)   Received from [facility]   SDOH Interventions:    Readmission Risk Interventions    10/30/2024    1:46 PM 02/11/2022   11:23 AM  Readmission Risk Prevention Plan  Post Dischage Appt  Complete  Medication Screening  Complete  Transportation Screening Complete Complete  PCP or Specialist Appt within 5-7 Days Not Complete   Home Care Screening Complete   Medication Review (RN CM) Complete

## 2024-10-30 NOTE — Evaluation (Signed)
 Occupational Therapy Evaluation Patient Details Name: Brandi Mcmahon MRN: [id number] DOB: [birth year] Today's Date: 10/30/2024   History of Present Illness   Brandi Mcmahon is a 84 y.o. 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       This evening, as her daughter-in-law was preparing to bring her to the ER the patient was noted to be aphasic with facial droop and slurred speech witnessed by the patient's son and daughter-in-law.  EMS was activated.  This episode lasted about 10 minutes then resolved spontaneously. (per DO)     Clinical Impressions Pt agreeable to OT evaluation. Pt appears to be near baseline function for ADL's. Pt reports she can complete seated ADL's without physical assist at baseline, but needs assist for transfers due to balance deficits. Today pt ambulated in the room with supervision using RW. B UE WFL strength and A/ROM. Pt at level of set up for seated tasks and supervision for standing ADL tasks based on observation. Deferring to physical therapy for balance work. Pt left in the chair with call bell within reach and family present. Pt is not recommended for any further acute OT services and will be discharged to care of nursing staff for remaining length of stay.         If plan is discharge home, recommend the following:   A little help with walking and/or transfers;A little help with bathing/dressing/bathroom;Assist for transportation;Help with stairs or ramp for entrance     Functional Status Assessment   Patient has not had a recent  decline in their functional status     Equipment Recommendations   None recommended by OT             Precautions/Restrictions   Precautions Precautions: Fall Recall of Precautions/Restrictions: Intact Restrictions Weight Bearing Restrictions Per Provider Order: No     Mobility Bed Mobility               General bed mobility comments: Pt seated in the chair at the start of the session today.    Transfers Overall transfer level: Needs assistance Equipment used: Rolling walker (2 wheels), 1 person hand held assist Transfers: Sit to/from Stand Sit to Stand: Supervision           General transfer comment: Sit to stand with RW from chair without physical assist followed by ambulation in the room with RW.      Balance Overall balance assessment: Needs assistance Sitting-balance support: Feet supported, No upper extremity supported Sitting balance-Leahy Scale: Good Sitting balance - Comments: seated in the recliner   Standing balance support: Bilateral upper extremity supported, During functional activity, Reliant on assistive device for balance Standing balance-Leahy Scale: Fair Standing balance comment: using RW                           ADL either performed or assessed with clinical judgement   ADL Overall ADL's : Needs assistance/impaired     Grooming: Supervision/safety;Standing       Lower Body Bathing: Set up;Sitting/lateral leans       Lower Body Dressing: Set up;Sitting/lateral leans   Toilet  Transfer: Supervision/safety;Rolling walker (2 wheels);Ambulation Toilet Transfer Details (indicate cue type and reason): Via ambulation in the room with RW         Functional mobility during ADLs: Supervision/safety;Rolling walker (2 wheels)       Vision Baseline Vision/History: 1 Wears glasses Ability to See in Adequate Light: 1 Impaired Patient Visual Report: No change from baseline Vision Assessment?: No apparent visual  deficits     Perception Perception: Not tested       Praxis Praxis: Not tested       Pertinent Vitals/Pain Pain Assessment Pain Assessment: No/denies pain     Extremity/Trunk Assessment Upper Extremity Assessment Upper Extremity Assessment: Overall WFL for tasks assessed   Lower Extremity Assessment Lower Extremity Assessment: Defer to PT evaluation   Cervical / Trunk Assessment Cervical / Trunk Assessment: Kyphotic   Communication Communication Communication: No apparent difficulties   Cognition Arousal: Alert Behavior During Therapy: WFL for tasks assessed/performed Cognition: No apparent impairments                               Following commands: Intact       Cueing  General Comments   Cueing Techniques: Verbal cues;Tactile cues                 Home Living Family/patient expects to be discharged to:: Private residence Living Arrangements: Children Available Help at Discharge: Family Type of Home: Mobile home Home Access: Stairs to enter Entrance Stairs-Number of Steps: 4 Entrance Stairs-Rails: Right Home Layout: One level     Bathroom Shower/Tub: Producer, Television/film/video: Handicapped height Bathroom Accessibility: Yes   Home Equipment: Cane - single point;Standard Building Surveyor (2 wheels);Wheelchair - manual   Additional Comments: Supplemental O2 at baseline.      Prior Functioning/Environment Prior Level of Function : Needs assist       Physical Assist : ADLs (physical)   ADLs (physical): IADLs;Dressing;Toileting;Bathing Mobility Comments: household ambulation using RW, on 2 LPM constant at home ADLs Comments: Pt reports assist for standing ADL tasks due to balance issues. Pt reports being able to complete seated ADL's. IADL assist.                      End of Session Equipment Utilized During Treatment: Rolling walker (2 wheels);Oxygen  Activity Tolerance: Patient tolerated treatment  well Patient left: in chair;with call bell/phone within reach;with family/visitor present  OT Visit Diagnosis: Unsteadiness on feet (R26.81);Other abnormalities of gait and mobility (R26.89);Muscle weakness (generalized) (M62.81)                Time: 8954-8945 OT Time Calculation (min): 9 min Charges:  OT General Charges $OT Visit: 1 Visit OT Evaluation $OT Eval Low Complexity: 1 Low  Elly Haffey OT, MOT  Jayson Person 10/30/2024, 12:11 PM

## 2024-10-30 NOTE — Assessment & Plan Note (Signed)
-   Stable - Continue current antihypertensive agents - Follow vital signs.

## 2024-10-30 NOTE — Assessment & Plan Note (Signed)
-   Condition likely exacerbated in the setting of pneumonia/bronchitis/bronchiectasis. - Continue bronchodilator management - Continue low-dose steroids - Continue antibiotics - Continue oxygen supplementation - Follow clinical response.

## 2024-10-30 NOTE — Plan of Care (Signed)

## 2024-10-30 NOTE — Assessment & Plan Note (Signed)
-   Continue outpatient follow-up with hematology service.

## 2024-10-30 NOTE — Progress Notes (Signed)
 SLP Cancellation Note  Patient Details Name: Brandi Mcmahon MRN: [id number] DOB: 1941-08-05   Cancelled treatment:       Reason Eval/Treat Not Completed: Patient at procedure or test/unavailable; pt in radiology for procedure when attempted evaluation.  ST will continue efforts as schedule permits.   Pat [name],M.S.,CCC-SLP 10/30/2024, 11:38 AM

## 2024-10-30 NOTE — Plan of Care (Signed)
" °  Problem: Education: Goal: Knowledge of General Education information will improve Description: Including pain rating scale, medication(s)/side effects and non-pharmacologic comfort measures Outcome: Progressing   Problem: Health Behavior/Discharge Planning: Goal: Ability to manage health-related needs will improve Outcome: Progressing   Problem: Clinical Measurements: Goal: Ability to maintain clinical measurements within normal limits will improve Outcome: Progressing Goal: Will remain free from infection Outcome: Progressing Goal: Diagnostic test results will improve Outcome: Progressing Goal: Respiratory complications will improve Outcome: Progressing Goal: Cardiovascular complication will be avoided Outcome: Progressing   Problem: Activity: Goal: Risk for activity intolerance will decrease Outcome: Progressing   Problem: Nutrition: Goal: Adequate nutrition will be maintained Outcome: Progressing   Problem: Coping: Goal: Level of anxiety will decrease Outcome: Progressing   Problem: Elimination: Goal: Will not experience complications related to bowel motility Outcome: Progressing Goal: Will not experience complications related to urinary retention Outcome: Progressing   Problem: Pain Managment: Goal: General experience of comfort will improve and/or be controlled Outcome: Progressing   Problem: Safety: Goal: Ability to remain free from injury will improve Outcome: Progressing   Problem: Skin Integrity: Goal: Risk for impaired skin integrity will decrease Outcome: Progressing   Problem: Education: Goal: Knowledge of disease or condition will improve Outcome: Progressing Goal: Knowledge of secondary prevention will improve (MUST DOCUMENT ALL) Outcome: Progressing Goal: Knowledge of patient specific risk factors will improve (DELETE if not current risk factor) Outcome: Progressing   Problem: Ischemic Stroke/TIA Tissue Perfusion: Goal: Complications of  ischemic stroke/TIA will be minimized 10/30/2024 0532 by Halbert Eleanor HERO, RN Outcome: Progressing 10/30/2024 0531 by Halbert Eleanor HERO, RN Outcome: Progressing   Problem: Coping: Goal: Will verbalize positive feelings about self Outcome: Progressing Goal: Will identify appropriate support needs Outcome: Progressing   Problem: Health Behavior/Discharge Planning: Goal: Ability to manage health-related needs will improve Outcome: Progressing Goal: Goals will be collaboratively established with patient/family Outcome: Progressing   Problem: Self-Care: Goal: Ability to participate in self-care as condition permits will improve Outcome: Progressing Goal: Verbalization of feelings and concerns over difficulty with self-care will improve Outcome: Progressing Goal: Ability to communicate needs accurately will improve Outcome: Progressing   Problem: Nutrition: Goal: Risk of aspiration will decrease Outcome: Progressing Goal: Dietary intake will improve Outcome: Progressing   "

## 2024-10-30 NOTE — Progress Notes (Signed)
 Pt transported via stretcher to radiology for procedure, son at bedside.

## 2024-10-30 NOTE — Assessment & Plan Note (Signed)
-   Present at time of admission with what appears to be parapneumonic effusion - Will follow thoracentesis results - Continue current antibiotics - Continue oxygen supplementation and supportive care - Follow clinical response.

## 2024-10-30 NOTE — Progress Notes (Signed)
 Pt had a left ultrasound guided thoracentesis. 125 pleural fluid removed successfully. VSS. Pt taken to xray for a chest xray and taken back upstairs by transporter.

## 2024-10-30 NOTE — Procedures (Signed)
 PROCEDURE SUMMARY:  Successful image-guided left thoracentesis. Yielded 125 milliliters of serosanguinous fluid. Due to highly loculated nature of the effusion, no further fluid was able to be removed. Patient tolerated procedure well. EBL < 1 mL No immediate complications.  Specimen was sent for labs. Post procedure CXR shows no pneumothorax.  Please see imaging section of Epic for full dictation.  Clotilda DELENA Hesselbach PA-C 10/30/2024 10:14 AM

## 2024-10-30 NOTE — Progress Notes (Signed)
 " PROGRESS NOTE    Brandi Mcmahon  FMW:[id number] DOB: [birth year] DOA: 10/28/2024  PCP: Mavis Coy    Chief Complaint  Patient presents with   Weakness    Brief admission narrative:  As per H&P written by Dr. Shona on 10/29/2024 Brandi Mcmahon is a 84 y.o. 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     This evening, as her daughter-in-law was preparing to bring her to the ER the patient was noted to be aphasic with facial droop and slurred speech witnessed by the patient's son and daughter-in-law.  EMS was activated.  This episode lasted about 10 minutes then resolved spontaneously.   In the ER, tachycardic 105, tachypneic 27, and hypoxic with O2 saturation of 87% on room air.  Chest x-ray revealed consolidation throughout the left lung concerning for pneumonia.  Possible left pleural effusion.  Emphysema/chronic changes.   Patient received IV azithromycin  and Rocephin  in the ER.  TRH, hospitalist service, was asked to admit for community-acquired pneumonia and TIA.   ED Course: Temperature 98.0, BP 157/70, pulse 93, respiration rate 26, O2 saturation 95% on 2 L.  Assessment & Plan: Assessment & Plan CAP (community acquired pneumonia) - Present at time of admission with what appears to be parapneumonic effusion - Will follow thoracentesis results - Continue current antibiotics - Continue oxygen supplementation and supportive care - Follow clinical response. TIA (transient ischemic attack) - Neurologic symptoms completely resolved - Patient workup  demonstrating no acute stroke - Left carotid artery stenosis (about 90% appreciated on CT angiogram of the neck. - Based on findings and presentation case discussed with neurology service who recommended the use of aspirin  and Plavix  for secondary prevention - Close outpatient follow-up with vascular surgeon. GERD (gastroesophageal reflux disease) - Continue PPI. Hyperlipidemia - Continue statin. Hypertension - Stable - Continue current antihypertensive agents - Follow vital signs. Monoclonal gammopathy - Continue outpatient follow-up with hematology service. 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 - Continue bronchodilator management - Continue low-dose steroids - Continue antibiotics - Continue oxygen supplementation - Follow clinical response. Cerebral aneurysm - Aneurysm arising from [facility] - Following radiology recommendations, CT angiogram of the head in 1 to 2 years is recommended to ensure stability  DVT prophylaxis: Lovenox  Code Status: DNR/DNI. Family Communication: Son at bedside; daughter-in-law updated through speaker phone call while making rounds. Disposition:   Status is: Inpatient Remains inpatient appropriate because: Continue IV therapy.   Consultants:  Neurology service curbside - IR (for thoracentesis).  Procedures:  See below for x-ray reports  Antimicrobials:  Rocephin  and Zithromax    Subjective: Afebrile, no chest pain, no nausea, no vomiting.  Reports intermittent mildly productive coughing spell.  Overall feeling better and expressed no focal neurologic complaints.  Objective: Vitals:   10/30/24 0704 10/30/24 0946 10/30/24 1200 10/30/24 1458  BP:  (!) 169/80 (!) 154/62 (!) 183/70  Pulse:   90 88  Resp:  17 18   Temp:  (!) 97.5 F (36.4 C)  (!) 97.5 F (36.4 C)  TempSrc:     Oral  SpO2: 95% 93% 95% 94%  Weight:      Height:        Intake/Output Summary (Last 24 hours) at 10/30/2024 1557 Last data filed at 10/30/2024 0840 Gross per 24 hour  Intake 565.1 ml  Output 400 ml  Net 165.1 ml   Filed Weights   10/29/24 0335  Weight: 54.9 kg    Examination:  General exam: Appears calm and comfortable.  No overnight events. Respiratory system: Positive scattered rhonchi are appreciated bilaterally (left more than right); no expiratory wheezing.  Improved air movement.  Good saturation on 2 L supplementation. Cardiovascular system: Rate controlled, no rubs, no gallops, no JVD on exam. Gastrointestinal system: Abdomen is nondistended, soft and nontender. No organomegaly or masses felt. Normal bowel sounds heard. Central nervous system: Moving 4 limbs spontaneously.  No focal neurological deficits. Extremities: Symmetric 5 x 5 power. Skin: No rashes, lesions or ulcers Psychiatry: Judgement and insight appear normal. Mood & affect appropriate.     Data Reviewed: I have personally reviewed following labs and imaging studies  CBC: Recent Labs  Lab 10/28/24 2331 10/29/24 0650  WBC 12.7* 10.1  NEUTROABS 10.6* 7.2  HGB 9.0* 7.2*  HCT 31.4* 24.2*  MCV 75.5* 74.9*  PLT 633* 516*    Basic Metabolic Panel: Recent Labs  Lab 10/28/24 2331 10/29/24 0650  NA 137 135  K 3.7 3.4*  CL 98 98  CO2 29 27  GLUCOSE 124* 101*  BUN 14 13  CREATININE 0.93 0.83  CALCIUM  9.2 8.4*  MG  --  1.5*  PHOS  --  3.1    GFR: Estimated Creatinine Clearance: 40 mL/min (by C-G formula based on SCr of 0.83 mg/dL).  Liver Function Tests: Recent Labs  Lab 10/29/24 0650  AST 28  ALT 10  ALKPHOS 118  BILITOT 0.2  PROT 6.1*  ALBUMIN 3.1*    CBG: No results for input(s): GLUCAP in the last 168 hours.   Recent Results (from the past 240 hours)  Resp panel by RT-PCR (RSV, Flu A&B, Covid) Anterior Nasal Swab     Status: None   Collection Time: 10/29/24 12:04 AM    Specimen: Anterior Nasal Swab  Result Value Ref Range Status   SARS Coronavirus 2 by RT PCR NEGATIVE NEGATIVE Final    Comment: (NOTE) SARS-CoV-2 target nucleic acids are NOT DETECTED.  The SARS-CoV-2 RNA is generally detectable in upper respiratory specimens during the acute phase of infection. The lowest concentration of SARS-CoV-2 viral copies this assay can detect is 138 copies/mL. A negative result does not preclude SARS-Cov-2 infection and should not be used as the sole basis for treatment or other patient management decisions. A negative result may occur with  improper specimen collection/handling, submission of specimen other than nasopharyngeal swab, presence of viral mutation(s) within the areas targeted by this assay, and inadequate number of viral copies(<138 copies/mL). A negative result must be combined with clinical observations, patient history, and epidemiological information. The expected result is Negative.  Fact Sheet for Patients:  bloggercourse.com  Fact Sheet for Healthcare Providers:  seriousbroker.it  This test is no t yet approved or cleared by the [place]  FDA and  has been authorized for detection and/or diagnosis of SARS-CoV-2 by FDA under an Emergency Use Authorization (EUA). This EUA will remain  in effect (meaning this test can be used) for the duration of the COVID-19 declaration under Section 564(b)(1) of the Act, 21 U.S.C.section 360bbb-3(b)(1), unless the authorization is terminated  or revoked sooner.  Influenza A by PCR NEGATIVE NEGATIVE Final   Influenza B by PCR NEGATIVE NEGATIVE Final    Comment: (NOTE) The Xpert Xpress SARS-CoV-2/FLU/RSV plus assay is intended as an aid in the diagnosis of influenza from Nasopharyngeal swab specimens and should not be used as a sole basis for treatment. Nasal washings and aspirates are unacceptable for Xpert Xpress  SARS-CoV-2/FLU/RSV testing.  Fact Sheet for Patients: bloggercourse.com  Fact Sheet for Healthcare Providers: seriousbroker.it  This test is not yet approved or cleared by the [place]  FDA and has been authorized for detection and/or diagnosis of SARS-CoV-2 by FDA under an Emergency Use Authorization (EUA). This EUA will remain in effect (meaning this test can be used) for the duration of the COVID-19 declaration under Section 564(b)(1) of the Act, 21 U.S.C. section 360bbb-3(b)(1), unless the authorization is terminated or revoked.     Resp Syncytial Virus by PCR NEGATIVE NEGATIVE Final    Comment: (NOTE) Fact Sheet for Patients: bloggercourse.com  Fact Sheet for Healthcare Providers: seriousbroker.it  This test is not yet approved or cleared by the [place]  FDA and has been authorized for detection and/or diagnosis of SARS-CoV-2 by FDA under an Emergency Use Authorization (EUA). This EUA will remain in effect (meaning this test can be used) for the duration of the COVID-19 declaration under Section 564(b)(1) of the Act, 21 U.S.C. section 360bbb-3(b)(1), unless the authorization is terminated or revoked.  Performed at [facility], [address], [place], [place] [address]   Culture, blood (Routine X 2) w Reflex to ID Panel     Status: None (Preliminary result)   Collection Time: 10/29/24  6:50 AM   Specimen: BLOOD  Result Value Ref Range Status   Specimen Description BLOOD LEFT ANTECUBITAL  Final   Special Requests   Final    BOTTLES DRAWN AEROBIC AND ANAEROBIC Blood Culture adequate volume   Culture   Final    NO GROWTH 1 DAY Performed at [facility], [address] [place], [place] [address]    Report Status PENDING  Incomplete  Culture, blood (Routine X 2) w Reflex to ID Panel     Status: None (Preliminary result)   Collection Time: 10/29/24  6:52 AM    Specimen: BLOOD LEFT ARM  Result Value Ref Range Status   Specimen Description BLOOD LEFT ARM BLOOD  Final   Special Requests   Final    BOTTLES DRAWN AEROBIC AND ANAEROBIC Blood Culture adequate volume   Culture   Final    NO GROWTH 1 DAY Performed at [facility], [address], [place], [place] [address]    Report Status PENDING  Incomplete  Expectorated Sputum Assessment w Gram Stain, Rflx to Resp Cult     Status: None   Collection Time: 10/29/24 12:50 PM   Specimen: Sputum  Result Value Ref Range Status   Specimen Description SPU  Final   Special Requests NONE  Final   Sputum evaluation   Final    THIS SPECIMEN IS ACCEPTABLE FOR SPUTUM CULTURE Performed at [facility], [address], [place], [place] [address]    Report Status 10/29/2024 FINAL  Final  Culture, Respiratory w Gram Stain     Status: None (Preliminary result)   Collection Time: 10/29/24 12:50 PM   Specimen: Sputum  Result Value Ref Range Status   Specimen Description   Final    SPU Performed at [facility], [address] [place], [place] [address]    Special Requests   Final  NONE Reflexed from [id number] Performed at [facility], [address], [place], [place] [address]    Gram Stain   Final    NO WBC SEEN FEW GRAM POSITIVE COCCI RARE GRAM NEGATIVE RODS    Culture   Final    CULTURE REINCUBATED FOR BETTER GROWTH Performed at [facility], [address], [place], [place] [address]    Report Status PENDING  Incomplete  Gram stain     Status: None   Collection Time: 10/30/24  9:45 AM   Specimen: Pleura  Result Value Ref Range Status   Specimen Description PLEURAL  Final   Special Requests NONE  Final   Gram Stain   Final    NO ORGANISMS SEEN WBC PRESENT,BOTH PMN AND MONONUCLEAR CYTOSPIN SMEAR Performed at [facility], [address] [place], [place] [address]    Report Status 10/30/2024 FINAL  Final    Radiology Studies: DG Chest 1 View Result Date: 10/30/2024 CLINICAL DATA:  Status  post small volume left thoracentesis. EXAM: CHEST  1 VIEW COMPARISON:  10/29/2024 FINDINGS: Minimal change in the left effusion following small volume thoracentesis only removing 125 cc due to loculation. No pneumothorax. Similar airspace opacities throughout the left lung. Mild interstitial pattern throughout the right lung, suspect chronic. No large right effusion. Trachea midline. Cardiac silhouette is obscured. Aorta atherosclerotic. Degenerative changes of the spine. IMPRESSION: Minimal change in the left effusion following small volume thoracentesis. No pneumothorax. Electronically Signed   By: CHRISTELLA.  Shick M.D.   On: 10/30/2024 11:57   US  THORACENTESIS ASP PLEURAL SPACE W/IMG GUIDE Result Date: 10/30/2024 INDICATION: Patient admitted with pneumonia and new loculated left pleural effusion concerning for possible empyema. Request for diagnostic and therapeutic thoracentesis. EXAM: ULTRASOUND GUIDED LEFT THORACENTESIS MEDICATIONS: 8 mL 1% lidocaine  COMPLICATIONS: None immediate. PROCEDURE: An ultrasound guided thoracentesis was thoroughly discussed with the patient and questions answered. The benefits, risks, alternatives and complications were also discussed. The patient understands and wishes to proceed with the procedure. Written consent was obtained. Ultrasound was performed to localize and mark an adequate pocket of fluid in the left chest. The area was then prepped and draped in the normal sterile fashion. 1% Lidocaine  was used for local anesthesia. Under ultrasound guidance a 19 gauge, 7-cm, Yueh catheter was introduced. Thoracentesis was performed. The catheter was removed and a dressing applied. FINDINGS: A total of approximately 125 mL of clear, serosanguineous fluid was removed. Due to highly loculated nature of the effusion, no further fluid was able to removed. samples were sent to the laboratory as requested by the clinical team. IMPRESSION: Successful ultrasound guided left thoracentesis yielding  125 mL of pleural fluid. Performed by Clotilda Hesselbach, PA-C Electronically Signed   By: CHRISTELLA.  Shick M.D.   On: 10/30/2024 11:54   ECHOCARDIOGRAM COMPLETE Result Date: 10/29/2024    ECHOCARDIOGRAM REPORT   Patient Name:   Brandi Mcmahon Date of Exam: 10/29/2024 Medical Rec #:  [id number]     Height:       60.0 in Accession #:    [id number]    Weight:       121.0 lb Date of Birth:  Jun 14, 1941    BSA:          1.508 m Patient Age:    83 years      BP:           122/55 mmHg Patient Gender: F             HR:  106 bpm. Exam Location:  [facility] Procedure: 2D Echo, Cardiac Doppler and Color Doppler (Both Spectral and Color            Flow Doppler were utilized during procedure). Indications:    TIA G45.9  History:        Patient has no prior history of Echocardiogram examinations.                 Risk Factors:Hypertension.  Sonographer:    Jayson Gaskins Referring Phys: [id number] CAROLE N HALL IMPRESSIONS  1. Left ventricular ejection fraction, by estimation, is 60 to 65%. The left ventricle has normal function. The left ventricle has no regional wall motion abnormalities. Left ventricular diastolic parameters are indeterminate.  2. Right ventricular systolic function is normal. The right ventricular size is normal. There is mildly elevated pulmonary artery systolic pressure. The estimated right ventricular systolic pressure is 43.2 mmHg.  3. The mitral valve is normal in structure. No evidence of mitral valve regurgitation. No evidence of mitral stenosis.  4. The aortic valve is normal in structure. Aortic valve regurgitation is not visualized. No aortic stenosis is present.  5. The inferior vena cava is normal in size with greater than 50% respiratory variability, suggesting right atrial pressure of 3 mmHg. FINDINGS  Left Ventricle: Left ventricular ejection fraction, by estimation, is 60 to 65%. The left ventricle has normal function. The left ventricle has no regional wall motion abnormalities. The left  ventricular internal cavity size was normal in size. There is  no left ventricular hypertrophy. Left ventricular diastolic parameters are indeterminate. Right Ventricle: The right ventricular size is normal. No increase in right ventricular wall thickness. Right ventricular systolic function is normal. There is mildly elevated pulmonary artery systolic pressure. The tricuspid regurgitant velocity is 3.17  m/s, and with an assumed right atrial pressure of 3 mmHg, the estimated right ventricular systolic pressure is 43.2 mmHg. Left Atrium: Left atrial size was normal in size. Right Atrium: Right atrial size was normal in size. Pericardium: There is no evidence of pericardial effusion. Mitral Valve: The mitral valve is normal in structure. Mild to moderate mitral annular calcification. No evidence of mitral valve regurgitation. No evidence of mitral valve stenosis. Tricuspid Valve: The tricuspid valve is normal in structure. Tricuspid valve regurgitation is mild . No evidence of tricuspid stenosis. Aortic Valve: The aortic valve is normal in structure. Aortic valve regurgitation is not visualized. No aortic stenosis is present. Aortic valve mean gradient measures 2.0 mmHg. Aortic valve peak gradient measures 3.8 mmHg. Aortic valve area, by VTI measures 2.63 cm. Pulmonic Valve: The pulmonic valve was normal in structure. Pulmonic valve regurgitation is not visualized. No evidence of pulmonic stenosis. Aorta: The aortic root is normal in size and structure. Venous: The inferior vena cava is normal in size with greater than 50% respiratory variability, suggesting right atrial pressure of 3 mmHg. IAS/Shunts: No atrial level shunt detected by color flow Doppler.  LEFT VENTRICLE PLAX 2D LVIDd:         4.10 cm   Diastology LVIDs:         2.60 cm   LV e' medial:    5.87 cm/s LV PW:         0.80 cm   LV E/e' medial:  16.1 LV IVS:        1.00 cm   LV e' lateral:   6.53 cm/s LVOT diam:     1.80 cm   LV E/e' lateral: 14.5 LV SV:  52 LV SV Index:   35 LVOT Area:     2.54 cm  RIGHT VENTRICLE RV S prime:     18.40 cm/s TAPSE (M-mode): 2.6 cm LEFT ATRIUM             Index        RIGHT ATRIUM           Index LA Vol (A2C):   24.6 ml 16.32 ml/m  RA Area:     14.30 cm LA Vol (A4C):   66.5 ml 44.11 ml/m  RA Volume:   35.00 ml  23.22 ml/m LA Biplane Vol: 42.9 ml 28.46 ml/m  AORTIC VALVE AV Area (Vmax):    2.70 cm AV Area (Vmean):   2.74 cm AV Area (VTI):     2.63 cm AV Vmax:           98.00 cm/s AV Vmean:          70.800 cm/s AV VTI:            0.198 m AV Peak Grad:      3.8 mmHg AV Mean Grad:      2.0 mmHg LVOT Vmax:         104.00 cm/s LVOT Vmean:        76.100 cm/s LVOT VTI:          0.205 m LVOT/AV VTI ratio: 1.04  AORTA Ao Root diam: 2.70 cm MITRAL VALVE                TRICUSPID VALVE MV Area (PHT): 4.04 cm     TR Peak grad:   40.2 mmHg MV Decel Time: 188 msec     TR Vmax:        317.00 cm/s MV E velocity: 94.40 cm/s MV A velocity: 107.00 cm/s  SHUNTS MV E/A ratio:  0.88         Systemic VTI:  0.20 m                             Systemic Diam: 1.80 cm Wilbert Bihari MD Electronically signed by Wilbert Bihari MD Signature Date/Time: 10/29/2024/11:21:50 AM    Final    CT ANGIO HEAD NECK W WO CM Addendum Date: 10/29/2024 ADDENDUM #1  ADDENDUM: The study is reviewed because AIdoc software detected a 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 A follow up CT angiogram of the head in 1 to 2 years is recommended to ensure stability. ---------------------------------------------------- Electronically signed by: Evalene Coho MD 10/29/2024 10:04 AM EST RP Workstation: [id number]   Result Date: 10/29/2024  ORIGINAL REPORT EXAM: CTA HEAD AND NECK WITH AND WITHOUT  10/29/2024 06:19:28 AM TECHNIQUE: CTA of the head and neck was performed with and without the administration of 75 mL of iohexol  (OMNIPAQUE ) 350 MG/ML injection. Multiplanar 2D and/or 3D reformatted images are provided for review. Automated exposure control, iterative reconstruction, and/or weight based adjustment of the mA/kV was utilized to reduce the radiation dose to as low as reasonably achievable. Stenosis of the internal carotid arteries measured using NASCET criteria. COMPARISON: CT of the head dated 10/29/2024 CLINICAL HISTORY: Transient ischemic attack (TIA) FINDINGS: CTA NECK: AORTIC ARCH AND  ARCH VESSELS: Ulcerative plaque present within the distal aortic arch. Moderate calcific plaque present. Standard 3-vessel takeoff of the great arteries. No dissection or arterial injury. No significant stenosis of the brachiocephalic or subclavian arteries. The ulcerative and moderate calcific plaque in the distal aortic arch represents a potential source of emboli. Medical management for atherosclerosis is recommended. CERVICAL CAROTID ARTERIES: Extensive calcific plaque within the origin of the left internal carotid artery with critical stenosis, greater than 90%. Calcific plaque within the proximal right internal carotid artery with less than 30% luminal stenosis. The left external carotid artery is patent without significant stenosis. No dissection or arterial injury. The critical left internal carotid artery stenosis is a significant finding that may be related to the patient's transient ischemic attack (TIA). Consideration for carotid endarterectomy (CEA) or carotid artery stenting (CAS) is recommended, depending on patient symptoms and risk factors. Further evaluation with dedicated carotid ultrasound or MRA may be warranted. CERVICAL VERTEBRAL ARTERIES: The left vertebral artery is dominant and the right vertebral artery is hypoplastic but patent. No dissection, arterial injury, or significant stenosis.  LUNGS AND MEDIASTINUM: Moderate central lobular emphysema. Coarse consolidation of the left upper lung. Clinical correlation and potential follow-up imaging (e.g., dedicated chest CT) for the lung findings. SOFT TISSUES: No acute abnormality. BONES: No acute abnormality. CTA HEAD: ANTERIOR CIRCULATION: Calcific plaque within the carotid siphons bilaterally with less than 20% luminal stenosis. No significant stenosis of the anterior cerebral arteries. No significant stenosis of the middle cerebral arteries. No aneurysm. POSTERIOR CIRCULATION: Fetal type origin of the right posterior cerebral artery with a diminutive right P1 segment. No significant stenosis of the posterior cerebral arteries. No significant stenosis of the basilar artery. No significant stenosis of the vertebral arteries. No aneurysm. OTHER: No dural venous sinus thrombosis on this non-dedicated study. IMPRESSION: 1. 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 2. Less than 30% stenosis of the proximal right internal carotid artery and less than 20% stenosis of the carotid siphons bilaterally due to calcific plaque. 3. Ulcerative plaque and moderate calcific plaque in the distal aortic arch, which can be a potential embolic source. 4. Fetal type origin of the right posterior cerebral artery with a diminutive right P1 segment. 5. Moderate centrilobular emphysema and coarse left upper lobe consolidation. Electronically signed by: Evalene Coho MD 10/29/2024 06:57 AM EST RP Workstation: [id number]   MR BRAIN W WO CONTRAST Result Date: 10/29/2024 EXAM: MRI BRAIN WITH AND WITHOUT CONTRAST 10/29/2024 09:14:24 AM TECHNIQUE: Multiplanar multisequence MRI of the head/brain was performed with and without the administration of 5 mL (gadobutrol  (GADAVIST ) 1 MMOL/ML injection 5 mL GADOBUTROL  1 MMOL/ML IV SOLN)  intravenous contrast. COMPARISON: CT of the head dated 10/29/2024. CLINICAL HISTORY: Transient ischemic attack (TIA). FINDINGS: BRAIN AND VENTRICLES: There is age-related atrophy and mild-to-moderate periventricular and subcortical white matter disease. There is no restricted diffusion to indicate acute or recent infarction. No acute intracranial hemorrhage. No mass effect or midline shift. No hydrocephalus. The sella is unremarkable. Normal flow voids. There is no abnormal parenchymal or meningeal enhancement present. ORBITS: No acute abnormality. SINUSES: No acute abnormality. BONES AND SOFT TISSUES: Normal bone marrow signal and enhancement. No acute soft tissue abnormality. IMPRESSION: 1. No acute intracranial abnormality. No restricted diffusion to indicate acute or recent infarction. 2. Age-related atrophy and mild-to-moderate periventricular and subcortical white matter disease. No abnormal parenchymal or meningeal enhancement. Electronically signed by: Evalene Coho MD  10/29/2024 09:58 AM EST RP Workstation: [id number]   CT CHEST WO CONTRAST Result Date: 10/29/2024 EXAM: CT CHEST WITH CONTRAST 10/29/2024 06:19:28 AM TECHNIQUE: CT of the chest was performed with the administration of 75 mL of iohexol  (OMNIPAQUE ) 350 MG/ML intravenous contrast. Multiplanar reformatted images are provided for review. Automated exposure control, iterative reconstruction, and/or weight based adjustment of the mA/kV was utilized to reduce the radiation dose to as low as reasonably achievable. COMPARISON: 01/07/2022 CLINICAL HISTORY: Abnormal xray - lung opacity/opacities. FINDINGS: MEDIASTINUM: Aortic atherosclerosis and coronary artery calcifications. Soft tissue nodularity within the epicardial fat overlying the left ventricular apex. Index nodule measures 0.9 cm (image 116/2). Nodular thickening of the pericardium is also noted. Index pericardial nodule anterior to the main pulmonary artery, measures 1.4 x 0.5 cm. No  pericardial effusion identified. The central airways are clear. LYMPH NODES: Pre-vascular lymph node measures 1.5 cm (image 64/2). Right paratracheal lymph node which measures 1.3 cm (image 65/2). Subcarinal node measures 1.7 cm (image 77/2). Hilar lymph nodes are suboptimimally evaluated due to lack of iv contrast. No axillary lymphadenopathy. LUNGS AND PLEURA: Debris containing gas is noted within the distal left mainstem bronchus extending into the upper and lower lobe airways. A large loculated left pleural effusion is identified. Diffuse nodular pleural thickening overlies the entire left lung. There is diffuse soft tissue nodularity with interlobular septal thickening within the left upper lobe, with dense consolidative changes within the inferior left lower lobe and lingula. Partial atelectasis of the left lower lobe with scattered areas of nodular consolidation. Emphysematous changes are noted throughout the right lung. Posterior right base measures 4 mm (image 140/4). Right apical pleural parenchymal scarring. No pneumothorax. SOFT TISSUES/BONES: No acute abnormality of the bones or soft tissues. UPPER ABDOMEN: Limited images of the upper abdomen demonstrates no acute abnormality. Punctate stone noted within the interpolar collecting system of the left kidney. Extensive aortic atherosclerotic calcifications. IMPRESSION: 1. Large loculated left pleural effusion with diffuse nodular pleural thickening, most concerning for malignant pleural disease such as pleural metastases or mesothelioma, with empyema less likely. 2. 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 3. Mediastinal lymphadenopathy, suspicious for nodal metastatic disease. 4. Epicardial fat and pericardial nodularity, suspicious for metastatic  disease. 5. Recommend pulmonology consultation for diagnostic thoracentesis with cytology and consideration of pleural biopsy, and consider PET/CT for further evaluation and staging. Electronically signed by: Waddell Calk MD 10/29/2024 06:38 AM EST RP Workstation: [id number]   DG Chest 2 View Result Date: 10/29/2024 EXAM: 2 VIEW(S) XRAY OF THE CHEST 10/29/2024 12:30:10 AM COMPARISON: Comparison 11/07/2009. CT 01/07/2022. CLINICAL HISTORY: cough FINDINGS: LUNGS AND PLEURA: Emphysema with hyperinflation. Consolidation throughout the left lung concerning for pneumonia. Underlying chronic lung disease/fibrosis. Possible left pleural effusion. No pneumothorax. HEART AND MEDIASTINUM: No acute abnormality of the cardiac and mediastinal silhouettes. BONES AND SOFT TISSUES: No acute osseous abnormality. IMPRESSION: 1. Consolidation throughout the left lung concerning for pneumonia. 2. Possible left pleural effusion. 3. Emphysema/chronic changes Electronically signed by: Franky Crease MD 10/29/2024 12:34 AM EST RP Workstation: [id number]   CT Head Wo Contrast Result Date: 10/29/2024 EXAM: CT HEAD WITHOUT 10/29/2024 12:23:26 AM TECHNIQUE: CT of the head was performed without the administration of intravenous contrast. Automated exposure control, iterative reconstruction, and/or weight based adjustment of the mA/kV was utilized to reduce the radiation dose to as low as reasonably achievable.  COMPARISON: None available. CLINICAL HISTORY: Neuro deficit, acute, stroke suspected Neuro deficit, acute, stroke suspected FINDINGS: BRAIN AND VENTRICLES: No acute intracranial hemorrhage. No mass effect or midline shift. No extra-axial fluid collection. No evidence of acute infarct. No hydrocephalus. ORBITS: No acute abnormality. SINUSES AND MASTOIDS: No acute abnormality. SOFT TISSUES AND SKULL: No acute skull fracture. No acute soft tissue abnormality. IMPRESSION: 1. No acute intracranial abnormality. Electronically signed by: Franky Crease MD 10/29/2024 12:27 AM EST RP Workstation: [id number]    Scheduled Meds:  aspirin  EC  81 mg Oral Daily   budesonide  (PULMICORT ) nebulizer solution  0.5 mg Nebulization BID   clopidogrel   75 mg Oral Daily   dextromethorphan -guaiFENesin   1 tablet Oral BID   enoxaparin  (LOVENOX ) injection  40 mg Subcutaneous Q24H   feeding supplement  237 mL Oral BID BM   fluticasone   2 spray Each Nare Daily   Influenza vac split trivalent PF  0.5 mL Intramuscular Tomorrow-1000   ipratropium-albuterol   3 mL Nebulization BID   methylPREDNISolone  (SOLU-MEDROL ) injection  40 mg Intravenous Q12H   pantoprazole   40 mg Oral BID   rosuvastatin   20 mg Oral Daily   Continuous Infusions:  azithromycin  500 mg (10/30/24 0834)   cefTRIAXone  (ROCEPHIN )  IV 200 mL/hr at 10/30/24 0301     LOS: 1 day    Time spent: 50 minutes    Eric Nunnery, MD [facility]   To contact the attending provider between 7A-7P or the covering provider during after hours 7P-7A, please log into the web site [email or web address] and access using universal [place] password for that web site. If you do not have the password, please call the hospital operator.  10/30/2024, 3:57 PM    "

## 2024-10-30 NOTE — Assessment & Plan Note (Signed)
"   Continue statin   "

## 2024-10-30 NOTE — Assessment & Plan Note (Signed)
-   Aneurysm arising from [facility] - Following radiology recommendations, CT angiogram of the head in 1 to 2 years is recommended to ensure stability

## 2024-10-30 NOTE — Assessment & Plan Note (Signed)
-   Neurologic symptoms completely resolved - Patient workup demonstrating no acute stroke - Left carotid artery stenosis (about 90% appreciated on CT angiogram of the neck. - Based on findings and presentation case discussed with neurology service who recommended the use of aspirin  and Plavix  for secondary prevention - Close outpatient follow-up with vascular surgeon.

## 2024-10-31 DIAGNOSIS — J9611 Chronic respiratory failure with hypoxia: Secondary | ICD-10-CM | POA: Diagnosis not present

## 2024-10-31 DIAGNOSIS — E782 Mixed hyperlipidemia: Secondary | ICD-10-CM | POA: Diagnosis not present

## 2024-10-31 DIAGNOSIS — I10 Essential (primary) hypertension: Secondary | ICD-10-CM | POA: Diagnosis not present

## 2024-10-31 DIAGNOSIS — J189 Pneumonia, unspecified organism: Secondary | ICD-10-CM | POA: Diagnosis not present

## 2024-10-31 LAB — GLUCOSE, BODY FLUID OTHER: Glucose, Body Fluid Other: 157 mg/dL

## 2024-10-31 LAB — ALBUMIN, FLUID (OTHER): Albumin, Body Fluid Other: 1.2 g/dL

## 2024-10-31 LAB — CULTURE, RESPIRATORY W GRAM STAIN
Culture: NORMAL
Gram Stain: NONE SEEN

## 2024-10-31 LAB — LD, BODY FLUID (OTHER): LD, Body Fluid: 368 IU/L

## 2024-10-31 LAB — PROTEIN, BODY FLUID (OTHER): Total Protein, Body Fluid Other: 2 g/dL

## 2024-10-31 MED ORDER — AZITHROMYCIN 250 MG PO TABS
500.0000 mg | ORAL_TABLET | Freq: Every day | ORAL | Status: AC
  Administered 2024-11-01 – 2024-11-02 (×2): 500 mg via ORAL
  Filled 2024-10-31 (×2): qty 2

## 2024-10-31 NOTE — Plan of Care (Signed)
" °  Problem: Clinical Measurements: Goal: Will remain free from infection Outcome: Not Progressing   Problem: Clinical Measurements: Goal: Respiratory complications will improve Outcome: Not Progressing   Problem: Ischemic Stroke/TIA Tissue Perfusion: Goal: Complications of ischemic stroke/TIA will be minimized Outcome: Not Progressing   Problem: Self-Care: Goal: Ability to participate in self-care as condition permits will improve Outcome: Not Progressing Goal: Verbalization of feelings and concerns over difficulty with self-care will improve Outcome: Not Progressing Goal: Ability to communicate needs accurately will improve Outcome: Not Progressing   "

## 2024-10-31 NOTE — Assessment & Plan Note (Signed)
"  Continue PPI  "

## 2024-10-31 NOTE — Progress Notes (Addendum)
 " PROGRESS NOTE    Brandi Mcmahon  FMW:[id number] DOB: [birth year] DOA: 10/28/2024  PCP: Mavis Coy    Chief Complaint  Patient presents with   Weakness    Brief admission narrative:  As per H&P written by Dr. Shona on 10/29/2024 Brandi Mcmahon is a 84 y.o. 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     This evening, as her daughter-in-law was preparing to bring her to the ER the patient was noted to be aphasic with facial droop and slurred speech witnessed by the patient's son and daughter-in-law.  EMS was activated.  This episode lasted about 10 minutes then resolved spontaneously.   In the ER, tachycardic 105, tachypneic 27, and hypoxic with O2 saturation of 87% on room air.  Chest x-ray revealed consolidation throughout the left lung concerning for pneumonia.  Possible left pleural effusion.  Emphysema/chronic changes.   Patient received IV azithromycin  and Rocephin  in the ER.  TRH, hospitalist service, was asked to admit for community-acquired pneumonia and TIA.   ED Course: Temperature 98.0, BP 157/70, pulse 93, respiration rate 26, O2 saturation 95% on 2 L.  Assessment & Plan: Assessment & Plan CAP (community acquired pneumonia) - Present at time of admission with what appears to be parapneumonic effusion - Follow culture results -Will also follow thoracentesis results; and cytology findings. - Continue current antibiotics - Continue oxygen supplementation and supportive care - Follow clinical response. TIA (transient ischemic attack) - Neurologic  symptoms completely resolved - Patient workup demonstrating no acute stroke - Left carotid artery stenosis (about 90% appreciated on CT angiogram of the neck. - Based on findings and presentation case discussed with neurology service who recommended the use of aspirin  and Plavix  for secondary prevention - Close outpatient follow-up with vascular surgery to discuss with patient and recommended by neurology service. GERD (gastroesophageal reflux disease) - Continue PPI. Hyperlipidemia - Continue statin. Hypertension - Stable - Continue current antihypertensive agents - Follow vital signs. Monoclonal gammopathy - Continue outpatient follow-up with hematology service. 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 - Continue bronchodilator management - Continue low-dose steroids; with anticipated steroid tapering to be started at discharge on 11/01/2024. - Continue current antibiotics - Continue oxygen supplementation - Follow clinical response. Cerebral aneurysm - Aneurysm arising from [facility] - Following radiology recommendations, CT angiogram of the head in 1 to 2 years is recommended to ensure stability  DVT prophylaxis: Lovenox  Code Status: DNR/DNI. Family Communication: Daughter-in-law at bedside. Disposition:   Status is: Inpatient Remains inpatient appropriate because: Continue IV therapy.   Consultants:  Neurology service curbside - IR (for thoracentesis).  Procedures:  See below for x-ray reports  Antimicrobials:  Rocephin  and Zithromax    Subjective: No fever; no chest pain, no nausea, no vomiting.  Overall feeling better.  Short winded with activity and expressing intermittent coughing spells.  Objective: Vitals:   10/30/24 2049 10/31/24 0422 10/31/24 0709 10/31/24 1409  BP: (!) 159/64 (!) 153/60  (!) 168/75   Pulse: 96 77  88  Resp: 18 20  20   Temp: ROLLEN)  97.5 F (36.4 C) 97.6 F (36.4 C)  (!) 97.5 F (36.4 C)  TempSrc: Oral Oral  Oral  SpO2: 94% 95% 95% 96%  Weight:      Height:        Intake/Output Summary (Last 24 hours) at 10/31/2024 1802 Last data filed at 10/31/2024 1428 Gross per 24 hour  Intake 340 ml  Output 200 ml  Net 140 ml   Filed Weights   10/29/24 0335  Weight: 54.9 kg    Examination:  General exam: Alert, awake, oriented x 3; following commands appropriately and feeling better overall.  Reports improvement in her appetite. Respiratory system: No using accessory muscles; good saturation on 2 L supplementation.  Still short winded with activity and demonstrating mild expiratory wheezing.  Positive rhonchi appreciated at the bases (left more than right). Cardiovascular system: Rate controlled, no rubs, no gallops, no JVD. Gastrointestinal system: Abdomen is nondistended, soft and nontender. No organomegaly or masses felt. Normal bowel sounds heard. Central nervous system: No focal neurological deficits. Extremities: No cyanosis or clubbing; no edema. Skin: No petechiae. Psychiatry: Judgement and insight appear normal. Mood & affect appropriate.   Data Reviewed: I have personally reviewed following labs and imaging studies  CBC: Recent Labs  Lab 10/28/24 2331 10/29/24 0650  WBC 12.7* 10.1  NEUTROABS 10.6* 7.2  HGB 9.0* 7.2*  HCT 31.4* 24.2*  MCV 75.5* 74.9*  PLT 633* 516*    Basic Metabolic Panel: Recent Labs  Lab 10/28/24 2331 10/29/24 0650  NA 137 135  K 3.7 3.4*  CL 98 98  CO2 29 27  GLUCOSE 124* 101*  BUN 14 13  CREATININE 0.93 0.83  CALCIUM  9.2 8.4*  MG  --  1.5*  PHOS  --  3.1    GFR: Estimated Creatinine Clearance: 40 mL/min (by C-G formula based on SCr of 0.83 mg/dL).  Liver Function Tests: Recent Labs  Lab 10/29/24 0650  AST 28  ALT 10  ALKPHOS 118  BILITOT 0.2  PROT 6.1*  ALBUMIN 3.1*    CBG: No results for input(s):  GLUCAP in the last 168 hours.   Recent Results (from the past 240 hours)  Resp panel by RT-PCR (RSV, Flu A&B, Covid) Anterior Nasal Swab     Status: None   Collection Time: 10/29/24 12:04 AM   Specimen: Anterior Nasal Swab  Result Value Ref Range Status   SARS Coronavirus 2 by RT PCR NEGATIVE NEGATIVE Final    Comment: (NOTE) SARS-CoV-2 target nucleic acids are NOT DETECTED.  The SARS-CoV-2 RNA is generally detectable in upper respiratory specimens during the acute phase of infection. The lowest concentration of SARS-CoV-2 viral copies this assay can detect is 138 copies/mL. A negative result does not preclude SARS-Cov-2 infection and should not be used as the sole basis for treatment or other patient management decisions. A negative result may occur with  improper specimen collection/handling, submission of specimen other than nasopharyngeal swab, presence of viral mutation(s) within the areas targeted by this assay, and inadequate number of viral copies(<138 copies/mL). A negative result must be combined with clinical observations, patient history, and epidemiological information. The expected result is Negative.  Fact Sheet for Patients:  bloggercourse.com  Fact Sheet for Healthcare Providers:  seriousbroker.it  This test is no t yet approved or cleared by the [place]  FDA and  has been authorized for detection and/or diagnosis of SARS-CoV-2 by FDA under an Emergency Use Authorization (EUA). This EUA will remain  in effect (meaning this test can  be used) for the duration of the COVID-19 declaration under Section 564(b)(1) of the Act, 21 U.S.C.section 360bbb-3(b)(1), unless the authorization is terminated  or revoked sooner.       Influenza A by PCR NEGATIVE NEGATIVE Final   Influenza B by PCR NEGATIVE NEGATIVE Final    Comment: (NOTE) The Xpert Xpress SARS-CoV-2/FLU/RSV plus assay is intended as an aid in the  diagnosis of influenza from Nasopharyngeal swab specimens and should not be used as a sole basis for treatment. Nasal washings and aspirates are unacceptable for Xpert Xpress SARS-CoV-2/FLU/RSV testing.  Fact Sheet for Patients: bloggercourse.com  Fact Sheet for Healthcare Providers: seriousbroker.it  This test is not yet approved or cleared by the [place]  FDA and has been authorized for detection and/or diagnosis of SARS-CoV-2 by FDA under an Emergency Use Authorization (EUA). This EUA will remain in effect (meaning this test can be used) for the duration of the COVID-19 declaration under Section 564(b)(1) of the Act, 21 U.S.C. section 360bbb-3(b)(1), unless the authorization is terminated or revoked.     Resp Syncytial Virus by PCR NEGATIVE NEGATIVE Final    Comment: (NOTE) Fact Sheet for Patients: bloggercourse.com  Fact Sheet for Healthcare Providers: seriousbroker.it  This test is not yet approved or cleared by the [place]  FDA and has been authorized for detection and/or diagnosis of SARS-CoV-2 by FDA under an Emergency Use Authorization (EUA). This EUA will remain in effect (meaning this test can be used) for the duration of the COVID-19 declaration under Section 564(b)(1) of the Act, 21 U.S.C. section 360bbb-3(b)(1), unless the authorization is terminated or revoked.  Performed at [facility], [address], [place], [place] [address]   Culture, blood (Routine X 2) w Reflex to ID Panel     Status: None (Preliminary result)   Collection Time: 10/29/24  6:50 AM   Specimen: BLOOD  Result Value Ref Range Status   Specimen Description BLOOD LEFT ANTECUBITAL  Final   Special Requests   Final    BOTTLES DRAWN AEROBIC AND ANAEROBIC Blood Culture adequate volume   Culture   Final    NO GROWTH 2 DAYS Performed at [facility], [address], [place], [place]  [address]    Report Status PENDING  Incomplete  Culture, blood (Routine X 2) w Reflex to ID Panel     Status: None (Preliminary result)   Collection Time: 10/29/24  6:52 AM   Specimen: BLOOD LEFT ARM  Result Value Ref Range Status   Specimen Description BLOOD LEFT ARM BLOOD  Final   Special Requests   Final    BOTTLES DRAWN AEROBIC AND ANAEROBIC Blood Culture adequate volume   Culture   Final    NO GROWTH 2 DAYS Performed at [facility], [address], [place], [place] [address]    Report Status PENDING  Incomplete  Expectorated Sputum Assessment w Gram Stain, Rflx to Resp Cult     Status: None   Collection Time: 10/29/24 12:50 PM   Specimen: Sputum  Result Value Ref Range Status   Specimen Description SPU  Final   Special Requests NONE  Final   Sputum evaluation   Final    THIS SPECIMEN IS ACCEPTABLE FOR SPUTUM CULTURE Performed at [facility], [address] [place], [place] [address]    Report Status 10/29/2024 FINAL  Final  Culture, Respiratory w Gram Stain     Status: None   Collection Time: 10/29/24 12:50 PM   Specimen: Sputum  Result Value Ref Range Status  Specimen Description   Final    SPU Performed at [facility], [address], [place], [place] [address]    Special Requests   Final    NONE Reflexed from [id number] Performed at [facility], [address] [place], [place] [address]    Gram Stain   Final    NO WBC SEEN FEW GRAM POSITIVE COCCI RARE GRAM NEGATIVE RODS    Culture   Final    FEW Normal respiratory flora-no Staph aureus or Pseudomonas seen Performed at [facility], [address], [place], [place] [address]    Report Status 10/31/2024 FINAL  Final  Gram stain     Status: None   Collection Time: 10/30/24  9:45 AM   Specimen: Pleura  Result Value Ref Range Status   Specimen Description PLEURAL  Final   Special Requests NONE  Final   Gram Stain   Final    NO ORGANISMS SEEN WBC PRESENT,BOTH PMN AND MONONUCLEAR CYTOSPIN SMEAR Performed at  [facility], [address], [place], [place] [address]    Report Status 10/30/2024 FINAL  Final  Culture, body fluid w Gram Stain-bottle     Status: None (Preliminary result)   Collection Time: 10/30/24  9:45 AM   Specimen: Pleura  Result Value Ref Range Status   Specimen Description PLEURAL BOTTLES DRAWN AEROBIC AND ANAEROBIC  Final   Special Requests 10CC  Final   Culture   Final    NO GROWTH < 24 HOURS Performed at [facility], [address], [place], [place] [address]    Report Status PENDING  Incomplete    Radiology Studies: DG Chest 1 View Result Date: 10/30/2024 CLINICAL DATA:  Status post small volume left thoracentesis. EXAM: CHEST  1 VIEW COMPARISON:  10/29/2024 FINDINGS: Minimal change in the left effusion following small volume thoracentesis only removing 125 cc due to loculation. No pneumothorax. Similar airspace opacities throughout the left lung. Mild interstitial pattern throughout the right lung, suspect chronic. No large right effusion. Trachea midline. Cardiac silhouette is obscured. Aorta atherosclerotic. Degenerative changes of the spine. IMPRESSION: Minimal change in the left effusion following small volume thoracentesis. No pneumothorax. Electronically Signed   By: CHRISTELLA.  Shick M.D.   On: 10/30/2024 11:57   US  THORACENTESIS ASP PLEURAL SPACE W/IMG GUIDE Result Date: 10/30/2024 INDICATION: Patient admitted with pneumonia and new loculated left pleural effusion concerning for possible empyema. Request for diagnostic and therapeutic thoracentesis. EXAM: ULTRASOUND GUIDED LEFT THORACENTESIS MEDICATIONS: 8 mL 1% lidocaine  COMPLICATIONS: None immediate. PROCEDURE: An ultrasound guided thoracentesis was thoroughly discussed with the patient and questions answered. The benefits, risks, alternatives and complications were also discussed. The patient understands and wishes to proceed with the procedure. Written consent was obtained. Ultrasound was performed to localize and mark an  adequate pocket of fluid in the left chest. The area was then prepped and draped in the normal sterile fashion. 1% Lidocaine  was used for local anesthesia. Under ultrasound guidance a 19 gauge, 7-cm, Yueh catheter was introduced. Thoracentesis was performed. The catheter was removed and a dressing applied. FINDINGS: A total of approximately 125 mL of clear, serosanguineous fluid was removed. Due to highly loculated nature of the effusion, no further fluid was able to removed. samples were sent to the laboratory as requested by the clinical team. IMPRESSION: Successful ultrasound guided left thoracentesis yielding 125 mL of pleural fluid. Performed by Clotilda Hesselbach, PA-C Electronically Signed   By: CHRISTELLA.  Shick M.D.   On: 10/30/2024 11:54   ECHOCARDIOGRAM  COMPLETE Result Date: 10/29/2024    ECHOCARDIOGRAM REPORT   Patient Name:   NAZYIA GAUGH Date of Exam: 10/29/2024 Medical Rec #:  [id number]     Height:       60.0 in Accession #:    [id number]    Weight:       121.0 lb Date of Birth:  1941/08/10    BSA:          1.508 m Patient Age:    83 years      BP:           122/55 mmHg Patient Gender: F             HR:           106 bpm. Exam Location:  [facility] Procedure: 2D Echo, Cardiac Doppler and Color Doppler (Both Spectral and Color            Flow Doppler were utilized during procedure). Indications:    TIA G45.9  History:        Patient has no prior history of Echocardiogram examinations.                 Risk Factors:Hypertension.  Sonographer:    Jayson Gaskins Referring Phys: [id number] CAROLE N HALL IMPRESSIONS  1. Left ventricular ejection fraction, by estimation, is 60 to 65%. The left ventricle has normal function. The left ventricle has no regional wall motion abnormalities. Left ventricular diastolic parameters are indeterminate.  2. Right ventricular systolic function is normal. The right ventricular size is normal. There is mildly elevated pulmonary artery systolic pressure. The estimated right ventricular  systolic pressure is 43.2 mmHg.  3. The mitral valve is normal in structure. No evidence of mitral valve regurgitation. No evidence of mitral stenosis.  4. The aortic valve is normal in structure. Aortic valve regurgitation is not visualized. No aortic stenosis is present.  5. The inferior vena cava is normal in size with greater than 50% respiratory variability, suggesting right atrial pressure of 3 mmHg. FINDINGS  Left Ventricle: Left ventricular ejection fraction, by estimation, is 60 to 65%. The left ventricle has normal function. The left ventricle has no regional wall motion abnormalities. The left ventricular internal cavity size was normal in size. There is  no left ventricular hypertrophy. Left ventricular diastolic parameters are indeterminate. Right Ventricle: The right ventricular size is normal. No increase in right ventricular wall thickness. Right ventricular systolic function is normal. There is mildly elevated pulmonary artery systolic pressure. The tricuspid regurgitant velocity is 3.17  m/s, and with an assumed right atrial pressure of 3 mmHg, the estimated right ventricular systolic pressure is 43.2 mmHg. Left Atrium: Left atrial size was normal in size. Right Atrium: Right atrial size was normal in size. Pericardium: There is no evidence of pericardial effusion. Mitral Valve: The mitral valve is normal in structure. Mild to moderate mitral annular calcification. No evidence of mitral valve regurgitation. No evidence of mitral valve stenosis. Tricuspid Valve: The tricuspid valve is normal in structure. Tricuspid valve regurgitation is mild . No evidence of tricuspid stenosis. Aortic Valve: The aortic valve is normal in structure. Aortic valve regurgitation is not visualized. No aortic stenosis is present. Aortic valve mean gradient measures 2.0 mmHg. Aortic valve peak gradient measures 3.8 mmHg. Aortic valve area, by VTI measures 2.63 cm. Pulmonic Valve: The pulmonic valve was normal in  structure. Pulmonic valve regurgitation is not visualized. No evidence of pulmonic stenosis. Aorta: The aortic root is normal in size  and structure. Venous: The inferior vena cava is normal in size with greater than 50% respiratory variability, suggesting right atrial pressure of 3 mmHg. IAS/Shunts: No atrial level shunt detected by color flow Doppler.  LEFT VENTRICLE PLAX 2D LVIDd:         4.10 cm   Diastology LVIDs:         2.60 cm   LV e' medial:    5.87 cm/s LV PW:         0.80 cm   LV E/e' medial:  16.1 LV IVS:        1.00 cm   LV e' lateral:   6.53 cm/s LVOT diam:     1.80 cm   LV E/e' lateral: 14.5 LV SV:         52 LV SV Index:   35 LVOT Area:     2.54 cm  RIGHT VENTRICLE RV S prime:     18.40 cm/s TAPSE (M-mode): 2.6 cm LEFT ATRIUM             Index        RIGHT ATRIUM           Index LA Vol (A2C):   24.6 ml 16.32 ml/m  RA Area:     14.30 cm LA Vol (A4C):   66.5 ml 44.11 ml/m  RA Volume:   35.00 ml  23.22 ml/m LA Biplane Vol: 42.9 ml 28.46 ml/m  AORTIC VALVE AV Area (Vmax):    2.70 cm AV Area (Vmean):   2.74 cm AV Area (VTI):     2.63 cm AV Vmax:           98.00 cm/s AV Vmean:          70.800 cm/s AV VTI:            0.198 m AV Peak Grad:      3.8 mmHg AV Mean Grad:      2.0 mmHg LVOT Vmax:         104.00 cm/s LVOT Vmean:        76.100 cm/s LVOT VTI:          0.205 m LVOT/AV VTI ratio: 1.04  AORTA Ao Root diam: 2.70 cm MITRAL VALVE                TRICUSPID VALVE MV Area (PHT): 4.04 cm     TR Peak grad:   40.2 mmHg MV Decel Time: 188 msec     TR Vmax:        317.00 cm/s MV E velocity: 94.40 cm/s MV A velocity: 107.00 cm/s  SHUNTS MV E/A ratio:  0.88         Systemic VTI:  0.20 m                             Systemic Diam: 1.80 cm Wilbert Bihari MD Electronically signed by Wilbert Bihari MD Signature Date/Time: 10/29/2024/11:21:50 AM    Final    CT ANGIO HEAD NECK W WO CM Addendum Date: 10/29/2024 ADDENDUM #1  ADDENDUM: The study is reviewed because AIdoc software detected a questionable aneurysm arising  from the anterior communicating artery which was not appreciated or described in the original report. 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 A  follow up CT angiogram of the head in 1 to 2 years is recommended to ensure stability. ---------------------------------------------------- Electronically signed by: Evalene Coho MD 10/29/2024 10:04 AM EST RP Workstation: [id number]   Result Date: 10/29/2024  ORIGINAL REPORT EXAM: CTA HEAD AND NECK WITH AND WITHOUT 10/29/2024 06:19:28 AM TECHNIQUE: CTA of the head and neck was performed with and without the administration of 75 mL of iohexol  (OMNIPAQUE ) 350 MG/ML injection. Multiplanar 2D and/or 3D reformatted images are provided for review. Automated exposure control, iterative reconstruction, and/or weight based adjustment of the mA/kV was utilized to reduce the radiation dose to as low as reasonably achievable. Stenosis of the internal carotid arteries measured using NASCET criteria. COMPARISON: CT of the head dated 10/29/2024 CLINICAL HISTORY: Transient ischemic attack (TIA) FINDINGS: CTA NECK: AORTIC ARCH AND ARCH VESSELS: Ulcerative plaque present within the distal aortic arch. Moderate calcific plaque present. Standard 3-vessel takeoff of the great arteries. No dissection or arterial injury. No significant stenosis of the brachiocephalic or subclavian arteries. The ulcerative and moderate calcific plaque in the distal aortic arch represents a potential source of emboli. Medical management for atherosclerosis is recommended. CERVICAL CAROTID ARTERIES: Extensive calcific plaque within the origin of the left internal carotid artery with critical stenosis, greater than 90%. Calcific plaque within the proximal right internal carotid artery with less than 30% luminal stenosis. The left external carotid artery is patent  without significant stenosis. No dissection or arterial injury. The critical left internal carotid artery stenosis is a significant finding that may be related to the patient's transient ischemic attack (TIA). Consideration for carotid endarterectomy (CEA) or carotid artery stenting (CAS) is recommended, depending on patient symptoms and risk factors. Further evaluation with dedicated carotid ultrasound or MRA may be warranted. CERVICAL VERTEBRAL ARTERIES: The left vertebral artery is dominant and the right vertebral artery is hypoplastic but patent. No dissection, arterial injury, or significant stenosis. LUNGS AND MEDIASTINUM: Moderate central lobular emphysema. Coarse consolidation of the left upper lung. Clinical correlation and potential follow-up imaging (e.g., dedicated chest CT) for the lung findings. SOFT TISSUES: No acute abnormality. BONES: No acute abnormality. CTA HEAD: ANTERIOR CIRCULATION: Calcific plaque within the carotid siphons bilaterally with less than 20% luminal stenosis. No significant stenosis of the anterior cerebral arteries. No significant stenosis of the middle cerebral arteries. No aneurysm. POSTERIOR CIRCULATION: Fetal type origin of the right posterior cerebral artery with a diminutive right P1 segment. No significant stenosis of the posterior cerebral arteries. No significant stenosis of the basilar artery. No significant stenosis of the vertebral arteries. No aneurysm. OTHER: No dural venous sinus thrombosis on this non-dedicated study. IMPRESSION: 1. 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 2. Less than 30% stenosis of the proximal right internal carotid artery and less than 20% stenosis of the carotid siphons bilaterally due to calcific plaque. 3. Ulcerative plaque and moderate calcific plaque in the distal aortic arch, which can be a  potential embolic source. 4. Fetal type origin of the right posterior cerebral artery with a diminutive right P1 segment. 5. Moderate centrilobular emphysema and coarse left upper lobe consolidation. Electronically signed by: Evalene Coho MD 10/29/2024 06:57 AM EST RP Workstation: [id number]   MR BRAIN W WO CONTRAST Result Date: 10/29/2024 EXAM: MRI BRAIN WITH AND WITHOUT CONTRAST 10/29/2024 09:14:24 AM TECHNIQUE: Multiplanar multisequence MRI of the head/brain was performed with and without the administration of 5 mL (gadobutrol  (GADAVIST ) 1 MMOL/ML injection 5  mL GADOBUTROL  1 MMOL/ML IV SOLN) intravenous contrast. COMPARISON: CT of the head dated 10/29/2024. CLINICAL HISTORY: Transient ischemic attack (TIA). FINDINGS: BRAIN AND VENTRICLES: There is age-related atrophy and mild-to-moderate periventricular and subcortical white matter disease. There is no restricted diffusion to indicate acute or recent infarction. No acute intracranial hemorrhage. No mass effect or midline shift. No hydrocephalus. The sella is unremarkable. Normal flow voids. There is no abnormal parenchymal or meningeal enhancement present. ORBITS: No acute abnormality. SINUSES: No acute abnormality. BONES AND SOFT TISSUES: Normal bone marrow signal and enhancement. No acute soft tissue abnormality. IMPRESSION: 1. No acute intracranial abnormality. No restricted diffusion to indicate acute or recent infarction. 2. Age-related atrophy and mild-to-moderate periventricular and subcortical white matter disease. No abnormal parenchymal or meningeal enhancement. Electronically signed by: Evalene Coho MD 10/29/2024 09:58 AM EST RP Workstation: [id number]   CT CHEST WO CONTRAST Result Date: 10/29/2024 EXAM: CT CHEST WITH CONTRAST 10/29/2024 06:19:28 AM TECHNIQUE: CT of the chest was performed with the administration of 75 mL of iohexol  (OMNIPAQUE ) 350 MG/ML intravenous contrast. Multiplanar reformatted images are provided for review. Automated  exposure control, iterative reconstruction, and/or weight based adjustment of the mA/kV was utilized to reduce the radiation dose to as low as reasonably achievable. COMPARISON: 01/07/2022 CLINICAL HISTORY: Abnormal xray - lung opacity/opacities. FINDINGS: MEDIASTINUM: Aortic atherosclerosis and coronary artery calcifications. Soft tissue nodularity within the epicardial fat overlying the left ventricular apex. Index nodule measures 0.9 cm (image 116/2). Nodular thickening of the pericardium is also noted. Index pericardial nodule anterior to the main pulmonary artery, measures 1.4 x 0.5 cm. No pericardial effusion identified. The central airways are clear. LYMPH NODES: Pre-vascular lymph node measures 1.5 cm (image 64/2). Right paratracheal lymph node which measures 1.3 cm (image 65/2). Subcarinal node measures 1.7 cm (image 77/2). Hilar lymph nodes are suboptimimally evaluated due to lack of iv contrast. No axillary lymphadenopathy. LUNGS AND PLEURA: Debris containing gas is noted within the distal left mainstem bronchus extending into the upper and lower lobe airways. A large loculated left pleural effusion is identified. Diffuse nodular pleural thickening overlies the entire left lung. There is diffuse soft tissue nodularity with interlobular septal thickening within the left upper lobe, with dense consolidative changes within the inferior left lower lobe and lingula. Partial atelectasis of the left lower lobe with scattered areas of nodular consolidation. Emphysematous changes are noted throughout the right lung. Posterior right base measures 4 mm (image 140/4). Right apical pleural parenchymal scarring. No pneumothorax. SOFT TISSUES/BONES: No acute abnormality of the bones or soft tissues. UPPER ABDOMEN: Limited images of the upper abdomen demonstrates no acute abnormality. Punctate stone noted within the interpolar collecting system of the left kidney. Extensive aortic atherosclerotic calcifications.  IMPRESSION: 1. Large loculated left pleural effusion with diffuse nodular pleural thickening, most concerning for malignant pleural disease such as pleural metastases or mesothelioma, with empyema less likely. 2. 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 3. Mediastinal lymphadenopathy, suspicious for nodal metastatic disease. 4. Epicardial fat and pericardial nodularity, suspicious for metastatic disease. 5. Recommend pulmonology consultation for diagnostic thoracentesis with cytology and consideration of pleural biopsy, and consider PET/CT for further evaluation and staging. Electronically signed by: Waddell Calk MD 10/29/2024 06:38 AM EST RP Workstation: [id number]   DG Chest 2 View Result Date: 10/29/2024 EXAM: 2 VIEW(S) XRAY OF THE CHEST 10/29/2024 12:30:10 AM COMPARISON:  Comparison 11/07/2009. CT 01/07/2022. CLINICAL HISTORY: cough FINDINGS: LUNGS AND PLEURA: Emphysema with hyperinflation. Consolidation throughout the left lung concerning for pneumonia. Underlying chronic lung disease/fibrosis. Possible left pleural effusion. No pneumothorax. HEART AND MEDIASTINUM: No acute abnormality of the cardiac and mediastinal silhouettes. BONES AND SOFT TISSUES: No acute osseous abnormality. IMPRESSION: 1. Consolidation throughout the left lung concerning for pneumonia. 2. Possible left pleural effusion. 3. Emphysema/chronic changes Electronically signed by: Franky Crease MD 10/29/2024 12:34 AM EST RP Workstation: [id number]   CT Head Wo Contrast Result Date: 10/29/2024 EXAM: CT HEAD WITHOUT 10/29/2024 12:23:26 AM TECHNIQUE: CT of the head was performed without the administration of intravenous contrast. Automated exposure control, iterative reconstruction, and/or weight based adjustment of the mA/kV was  utilized to reduce the radiation dose to as low as reasonably achievable. COMPARISON: None available. CLINICAL HISTORY: Neuro deficit, acute, stroke suspected Neuro deficit, acute, stroke suspected FINDINGS: BRAIN AND VENTRICLES: No acute intracranial hemorrhage. No mass effect or midline shift. No extra-axial fluid collection. No evidence of acute infarct. No hydrocephalus. ORBITS: No acute abnormality. SINUSES AND MASTOIDS: No acute abnormality. SOFT TISSUES AND SKULL: No acute skull fracture. No acute soft tissue abnormality. IMPRESSION: 1. No acute intracranial abnormality. Electronically signed by: Franky Crease MD 10/29/2024 12:27 AM EST RP Workstation: [id number]    Scheduled Meds:  aspirin  EC  81 mg Oral Daily   [START ON 11/01/2024] azithromycin   500 mg Oral Daily   budesonide  (PULMICORT ) nebulizer solution  0.5 mg Nebulization BID   clopidogrel   75 mg Oral Daily   dextromethorphan -guaiFENesin   1 tablet Oral BID   enoxaparin  (LOVENOX ) injection  40 mg Subcutaneous Q24H   feeding supplement  237 mL Oral BID BM   ipratropium-albuterol   3 mL Nebulization BID   methylPREDNISolone  (SOLU-MEDROL ) injection  40 mg Intravenous Q12H   pantoprazole   40 mg Oral BID   rosuvastatin   20 mg Oral Daily   Continuous Infusions:  cefTRIAXone  (ROCEPHIN )  IV Stopped (10/30/24 2152)     LOS: 2 days    Time spent: 50 minutes    Eric Nunnery, MD [facility]   To contact the attending provider between 7A-7P or the covering provider during after hours 7P-7A, please log into the web site [email or web address] and access using universal [place] password for that web site. If you do not have the password, please call the hospital operator.  10/31/2024, 6:02 PM    "

## 2024-10-31 NOTE — Assessment & Plan Note (Signed)
-   Continue outpatient follow-up with hematology service.

## 2024-10-31 NOTE — Assessment & Plan Note (Signed)
-   Neurologic symptoms completely resolved - Patient workup demonstrating no acute stroke - Left carotid artery stenosis (about 90% appreciated on CT angiogram of the neck. - Based on findings and presentation case discussed with neurology service who recommended the use of aspirin  and Plavix  for secondary prevention - Close outpatient follow-up with vascular surgery to discuss with patient and recommended by neurology service.

## 2024-10-31 NOTE — Assessment & Plan Note (Signed)
"   Continue statin   "

## 2024-10-31 NOTE — Progress Notes (Signed)
 SLP Cancellation Note  Patient Details Name: CINDA HARA MRN: [id number] DOB: [birth year]   Cancelled treatment:       Reason Eval/Treat Not Completed: SLP screened, no needs identified, will sign off; MRI negative for acute changes.  Pt stated she was back to baseline functioning with no speech/language concerns.  Please re-consult prn.   Pat Anaika Santillano,M.S.,CCC-SLP 10/31/2024, 11:25 AM

## 2024-10-31 NOTE — Assessment & Plan Note (Signed)
-   Stable - Continue current antihypertensive agents - Follow vital signs.

## 2024-10-31 NOTE — Assessment & Plan Note (Signed)
-   Present at time of admission with what appears to be parapneumonic effusion - Follow culture results -Will also follow thoracentesis results; and cytology findings. - Continue current antibiotics - Continue oxygen supplementation and supportive care - Follow clinical response.

## 2024-10-31 NOTE — Progress Notes (Signed)
 Mobility Specialist Progress Note:    10/31/24 0950  Mobility  Activity Ambulated with assistance  Level of Assistance Contact guard assist, steadying assist  Assistive Device Front wheel walker  Distance Ambulated (ft) 40 ft  Range of Motion/Exercises Active;All extremities  Activity Response Tolerated well  Mobility Referral Yes  Mobility visit 1 Mobility  Mobility Specialist Start Time (ACUTE ONLY) 0950  Mobility Specialist Stop Time (ACUTE ONLY) 1010  Mobility Specialist Time Calculation (min) (ACUTE ONLY) 20 min   Pt received in chair, agreeable to mobility. Required CGA to stand and ambulate with RW. Tolerated well, c/o being a little SOB. SpO2 85% on 2L EOS, pt was not recovering quickly with 2L so I bumped up to 3L to bring O2 back up to 92% and left pt on original 2L. Returned to chair, [facility] FUNDRA[facility] notified. All needs met.  Laine Giovanetti Mobility Specialist Please contact via Special Educational Needs Teacher or  Rehab office at [phone]

## 2024-10-31 NOTE — Assessment & Plan Note (Signed)
-   Condition likely exacerbated in the setting of pneumonia/bronchitis/bronchiectasis. - Continue bronchodilator management - Continue low-dose steroids; with anticipated steroid tapering to be started at discharge on 11/01/2024. - Continue current antibiotics - Continue oxygen supplementation - Follow clinical response.

## 2024-10-31 NOTE — Plan of Care (Signed)
" °  Problem: Education: Goal: Knowledge of General Education information will improve Description: Including pain rating scale, medication(s)/side effects and non-pharmacologic comfort measures Outcome: Progressing   Problem: Health Behavior/Discharge Planning: Goal: Ability to manage health-related needs will improve Outcome: Progressing   Problem: Clinical Measurements: Goal: Ability to maintain clinical measurements within normal limits will improve Outcome: Progressing Goal: Will remain free from infection Outcome: Progressing Goal: Diagnostic test results will improve Outcome: Progressing Goal: Respiratory complications will improve Outcome: Progressing Goal: Cardiovascular complication will be avoided Outcome: Progressing   Problem: Activity: Goal: Risk for activity intolerance will decrease Outcome: Progressing   Problem: Nutrition: Goal: Adequate nutrition will be maintained Outcome: Progressing   Problem: Pain Managment: Goal: General experience of comfort will improve and/or be controlled Outcome: Progressing   Problem: Coping: Goal: Level of anxiety will decrease Outcome: Not Progressing   "

## 2024-10-31 NOTE — Assessment & Plan Note (Signed)
-   Aneurysm arising from [facility] - Following radiology recommendations, CT angiogram of the head in 1 to 2 years is recommended to ensure stability

## 2024-10-31 NOTE — Care Management Important Message (Signed)
 Important Message  Patient Details  Name: Brandi Mcmahon MRN: [id number] Date of Birth: Apr 20, 1941   Important Message Given:  N/A - LOS <3 / Initial given by admissions     [name] Ada 10/31/2024, 11:26 AM

## 2024-11-01 LAB — CBC
HCT: 25.4 % — ABNORMAL LOW (ref 36.0–46.0)
Hemoglobin: 7.4 g/dL — ABNORMAL LOW (ref 12.0–15.0)
MCH: 21.7 pg — ABNORMAL LOW (ref 26.0–34.0)
MCHC: 29.1 g/dL — ABNORMAL LOW (ref 30.0–36.0)
MCV: 74.5 fL — ABNORMAL LOW (ref 80.0–100.0)
Platelets: 641 K/uL — ABNORMAL HIGH (ref 150–400)
RBC: 3.41 MIL/uL — ABNORMAL LOW (ref 3.87–5.11)
RDW: 20.9 % — ABNORMAL HIGH (ref 11.5–15.5)
WBC: 11.5 K/uL — ABNORMAL HIGH (ref 4.0–10.5)
nRBC: 0 % (ref 0.0–0.2)

## 2024-11-01 LAB — BASIC METABOLIC PANEL WITH GFR
Anion gap: 7 (ref 5–15)
BUN: 22 mg/dL (ref 8–23)
CO2: 32 mmol/L (ref 22–32)
Calcium: 8.1 mg/dL — ABNORMAL LOW (ref 8.9–10.3)
Chloride: 100 mmol/L (ref 98–111)
Creatinine, Ser: 0.71 mg/dL (ref 0.44–1.00)
GFR, Estimated: >60 mL/min
Glucose, Bld: 160 mg/dL — ABNORMAL HIGH (ref 70–99)
Potassium: 3.5 mmol/L (ref 3.5–5.1)
Sodium: 139 mmol/L (ref 135–145)

## 2024-11-01 MED ORDER — METHYLPREDNISOLONE SODIUM SUCC 40 MG IJ SOLR
40.0000 mg | Freq: Every day | INTRAMUSCULAR | Status: DC
  Administered 2024-11-03 – 2024-11-04 (×2): 40 mg via INTRAVENOUS
  Filled 2024-11-01 (×3): qty 1

## 2024-11-01 NOTE — Assessment & Plan Note (Signed)
-   Continue outpatient follow-up with hematology service.

## 2024-11-01 NOTE — Progress Notes (Addendum)
 " PROGRESS NOTE    Brandi Mcmahon  FMW:[id number] DOB: 03-01-1941 DOA: 10/28/2024  PCP: Mavis Coy    Chief Complaint  Patient presents with   Weakness    Brief admission narrative:  As per H&P written by Dr. Shona on 10/29/2024 Brandi Mcmahon is a 84 y.o. 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    In the ER patient was tachypneic, hypoxic, tachycardic.  Chest x-ray revealed consolidation on the left side along with pleural effusion.  Chronic changes.  Patient was admitted for further evaluation and treatment.  Received IV ceftriaxone  and azithromycin  with ongoing improvement.  CT scan of the chest revealed loculated effusion on the left side.  Also there is concern for pleural thickening, mediastinal lymph node, pericardial node concerning for malignancy.  Patient underwent thoracentesis with removal of 125 cc of fluid, cultures so far is negative.  Fluid appears exudative.  Patient is currently at baseline oxygen 2 L/min and does not appear toxic.  Consulted with pulmonology, after reviewing scans, Dr. Pleas PCCM recommended transfer to [facility] for further evaluation and potentially chest tube.  Of note, patient had a spells of aphasia and facial droop with slurred speech witnessed by son and daughter-in-law prior to coming to the ER.  The episode apparently lasted about 10 minutes.  Brain MRI negative.    Assessment & Plan: Assessment & Plan CAP (community acquired pneumonia) Loculated pleural effusion left Rule out malignancy - Present at time of  admission with what appears to be parapneumonic effusion, malignancy not ruled out. - CT with findings as above -Pleural fluid appears exudative, culture so far negative. -Patient is not toxic, hemodynamically stable and at baseline oxygen 2 L/min. -Continue symptomatic management. TIA (transient ischemic attack) - Neurologic symptoms completely resolved - Patient workup demonstrating no acute stroke - Left carotid artery stenosis (about 90% appreciated on CT angiogram of the neck. - Based on findings and presentation case discussed with neurology service who recommended the use of aspirin  and Plavix  for secondary prevention - Close outpatient follow-up with vascular surgery to discuss with patient and recommended by neurology service. GERD (gastroesophageal reflux disease) - Continue PPI. Hyperlipidemia - Continue statin. Hypertension - Stable - Continue current antihypertensive agents - Follow vital signs. Monoclonal gammopathy - Continue outpatient follow-up with hematology service. 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 - Continue bronchodilator management - Continue IV Solu-Medrol . - Continue current antibiotics - Continue oxygen supplementation - Follow clinical response. Cerebral aneurysm - Aneurysm arising from [facility] - Following radiology recommendations, CT angiogram of the head in 1 to 2 years is recommended to ensure stability  DVT prophylaxis: Lovenox  Code Status: DNR/DNI. Family Communication: Daughter-in-law at bedside. Disposition:   Status is: Inpatient Remains inpatient appropriate because: Continue IV therapy.   Consultants:  Pulmonology  Procedures:  See below for x-ray reports  Antimicrobials:  Rocephin  and Zithromax    Subjective: No fever on baseline oxygen 2 L/min.  Denies chest  pain.  Feels fatigued with ambulation and short of breath Objective: Vitals:   11/01/24 0419 11/01/24  0726 11/01/24 0800 11/01/24 1300  BP: (!) 160/62  (!) 150/58 (!) 157/79  Pulse: 83  97 97  Resp: 18  20 18   Temp: 98 F (36.7 C)  98 F (36.7 C) 97.9 F (36.6 C)  TempSrc: Oral  Oral Oral  SpO2: 96% 95% 94% 94%  Weight:      Height:        Intake/Output Summary (Last 24 hours) at 11/01/2024 1631 Last data filed at 11/01/2024 0900 Gross per 24 hour  Intake 120 ml  Output 300 ml  Net -180 ml   Filed Weights   10/29/24 0335  Weight: 54.9 kg    Examination:  General exam: Alert, awake, oriented x 3; following commands appropriately and feeling better overall.  Reports improvement in her appetite. Respiratory system: No using accessory muscles; good saturation on 2 L supplementation.  Still short winded with activity and demonstrating mild expiratory wheezing.  Positive rhonchi appreciated at the bases (left more than right). Cardiovascular system: Rate controlled, no rubs, no gallops, no JVD. Gastrointestinal system: Abdomen is nondistended, soft and nontender. No organomegaly or masses felt. Normal bowel sounds heard. Central nervous system: No focal neurological deficits. Extremities: No cyanosis or clubbing; no edema. Skin: No petechiae. Psychiatry: Judgement and insight appear normal. Mood & affect appropriate.   Data Reviewed: I have personally reviewed following labs and imaging studies  CBC: Recent Labs  Lab 10/28/24 2331 10/29/24 0650 11/01/24 0446  WBC 12.7* 10.1 11.5*  NEUTROABS 10.6* 7.2  --   HGB 9.0* 7.2* 7.4*  HCT 31.4* 24.2* 25.4*  MCV 75.5* 74.9* 74.5*  PLT 633* 516* 641*    Basic Metabolic Panel: Recent Labs  Lab 10/28/24 2331 10/29/24 0650 11/01/24 0446  NA 137 135 139  K 3.7 3.4* 3.5  CL 98 98 100  CO2 29 27 32  GLUCOSE 124* 101* 160*  BUN 14 13 22   CREATININE 0.93 0.83 0.71  CALCIUM  9.2 8.4* 8.1*  MG  --  1.5*  --   PHOS  --  3.1  --      GFR: Estimated Creatinine Clearance: 41.5 mL/min (by C-G formula based on SCr of 0.71 mg/dL).  Liver Function Tests: Recent Labs  Lab 10/29/24 0650  AST 28  ALT 10  ALKPHOS 118  BILITOT 0.2  PROT 6.1*  ALBUMIN 3.1*    CBG: No results for input(s): GLUCAP in the last 168 hours.   Recent Results (from the past 240 hours)  Resp panel by RT-PCR (RSV, Flu A&B, Covid) Anterior Nasal Swab     Status: None   Collection Time: 10/29/24 12:04 AM   Specimen: Anterior Nasal Swab  Result Value Ref Range Status   SARS Coronavirus 2 by RT PCR NEGATIVE NEGATIVE Final    Comment: (NOTE) SARS-CoV-2 target nucleic acids are NOT DETECTED.  The SARS-CoV-2 RNA is generally detectable in upper respiratory specimens during the acute phase of infection. The lowest concentration of SARS-CoV-2 viral copies this assay can detect is 138 copies/mL. A negative result does not preclude SARS-Cov-2 infection and should not be used as the sole basis for treatment or other patient management decisions. A negative result may occur with  improper specimen collection/handling, submission of specimen other than nasopharyngeal swab, presence of viral mutation(s) within the areas targeted by this assay, and inadequate number of viral copies(<138 copies/mL). A negative result must be combined with clinical observations, patient history, and epidemiological information. The expected result is Negative.  Fact Sheet for Patients:  bloggercourse.com  Fact Sheet for Healthcare Providers:  seriousbroker.it  This test is no t yet approved or cleared by the [place]  FDA and  has been authorized for detection and/or diagnosis of SARS-CoV-2 by FDA under an Emergency Use Authorization (EUA). This EUA will remain  in effect (meaning this test can be used) for the duration of the COVID-19 declaration under Section 564(b)(1) of the Act, 21 U.S.C.section  360bbb-3(b)(1), unless the authorization is terminated  or revoked sooner.       Influenza A by PCR NEGATIVE NEGATIVE Final   Influenza B by PCR NEGATIVE NEGATIVE Final    Comment: (NOTE) The Xpert Xpress SARS-CoV-2/FLU/RSV plus assay is intended as an aid in the diagnosis of influenza from Nasopharyngeal swab specimens and should not be used as a sole basis for treatment. Nasal washings and aspirates are unacceptable for Xpert Xpress SARS-CoV-2/FLU/RSV testing.  Fact Sheet for Patients: bloggercourse.com  Fact Sheet for Healthcare Providers: seriousbroker.it  This test is not yet approved or cleared by the [place]  FDA and has been authorized for detection and/or diagnosis of SARS-CoV-2 by FDA under an Emergency Use Authorization (EUA). This EUA will remain in effect (meaning this test can be used) for the duration of the COVID-19 declaration under Section 564(b)(1) of the Act, 21 U.S.C. section 360bbb-3(b)(1), unless the authorization is terminated or revoked.     Resp Syncytial Virus by PCR NEGATIVE NEGATIVE Final    Comment: (NOTE) Fact Sheet for Patients: bloggercourse.com  Fact Sheet for Healthcare Providers: seriousbroker.it  This test is not yet approved or cleared by the [place]  FDA and has been authorized for detection and/or diagnosis of SARS-CoV-2 by FDA under an Emergency Use Authorization (EUA). This EUA will remain in effect (meaning this test can be used) for the duration of the COVID-19 declaration under Section 564(b)(1) of the Act, 21 U.S.C. section 360bbb-3(b)(1), unless the authorization is terminated or revoked.  Performed at [facility], [address], [place], [place] [address]   Culture, blood (Routine X 2) w Reflex to ID Panel     Status: None (Preliminary result)   Collection Time: 10/29/24  6:50 AM   Specimen: BLOOD  Result Value  Ref Range Status   Specimen Description BLOOD LEFT ANTECUBITAL  Final   Special Requests   Final    BOTTLES DRAWN AEROBIC AND ANAEROBIC Blood Culture adequate volume   Culture   Final    NO GROWTH 3 DAYS Performed at [facility], [address] [place], [place] [address]    Report Status PENDING  Incomplete  Culture, blood (Routine X 2) w Reflex to ID Panel     Status: None (Preliminary result)   Collection Time: 10/29/24  6:52 AM   Specimen: BLOOD LEFT ARM  Result Value Ref Range Status   Specimen Description BLOOD LEFT ARM BLOOD  Final   Special Requests   Final    BOTTLES DRAWN AEROBIC AND ANAEROBIC Blood Culture adequate volume   Culture   Final    NO GROWTH 3 DAYS Performed at [facility], [address], [place], [place] [address]    Report Status PENDING  Incomplete  Expectorated Sputum Assessment w Gram Stain, Rflx to Resp Cult     Status: None   Collection Time: 10/29/24 12:50 PM   Specimen: Sputum  Result Value Ref Range Status   Specimen Description SPU  Final   Special Requests NONE  Final   Sputum evaluation   Final    THIS SPECIMEN IS ACCEPTABLE  FOR SPUTUM CULTURE Performed at [facility], [address] [place], [place] [address]    Report Status 10/29/2024 FINAL  Final  Culture, Respiratory w Gram Stain     Status: None   Collection Time: 10/29/24 12:50 PM   Specimen: Sputum  Result Value Ref Range Status   Specimen Description   Final    SPU Performed at [facility], [address], [place], [place] [address]    Special Requests   Final    NONE Reflexed from [id number] Performed at [facility], [address], [place], [place] [address]    Gram Stain   Final    NO WBC SEEN FEW GRAM POSITIVE COCCI RARE GRAM NEGATIVE RODS    Culture   Final    FEW Normal respiratory flora-no Staph aureus or Pseudomonas seen Performed at [facility], [address] [place], [place] [address]    Report Status 10/31/2024 FINAL  Final  Gram stain     Status: None    Collection Time: 10/30/24  9:45 AM   Specimen: Pleura  Result Value Ref Range Status   Specimen Description PLEURAL  Final   Special Requests NONE  Final   Gram Stain   Final    NO ORGANISMS SEEN WBC PRESENT,BOTH PMN AND MONONUCLEAR CYTOSPIN SMEAR Performed at [facility], [address], [place], [place] [address]    Report Status 10/30/2024 FINAL  Final  Culture, body fluid w Gram Stain-bottle     Status: None (Preliminary result)   Collection Time: 10/30/24  9:45 AM   Specimen: Pleura  Result Value Ref Range Status   Specimen Description PLEURAL BOTTLES DRAWN AEROBIC AND ANAEROBIC  Final   Special Requests 10CC  Final   Culture   Final    NO GROWTH 2 DAYS Performed at [facility], [address] [place], [place] [address]    Report Status PENDING  Incomplete    Radiology Studies: DG Chest 1 View Result Date: 10/30/2024 CLINICAL DATA:  Status post small volume left thoracentesis. EXAM: CHEST  1 VIEW COMPARISON:  10/29/2024 FINDINGS: Minimal change in the left effusion following small volume thoracentesis only removing 125 cc due to loculation. No pneumothorax. Similar airspace opacities throughout the left lung. Mild interstitial pattern throughout the right lung, suspect chronic. No large right effusion. Trachea midline. Cardiac silhouette is obscured. Aorta atherosclerotic. Degenerative changes of the spine. IMPRESSION: Minimal change in the left effusion following small volume thoracentesis. No pneumothorax. Electronically Signed   By: CHRISTELLA.  Shick M.D.   On: 10/30/2024 11:57   US  THORACENTESIS ASP PLEURAL SPACE W/IMG GUIDE Result Date: 10/30/2024 INDICATION: Patient admitted with pneumonia and new loculated left pleural effusion concerning for possible empyema. Request for diagnostic and therapeutic thoracentesis. EXAM: ULTRASOUND GUIDED LEFT THORACENTESIS MEDICATIONS: 8 mL 1% lidocaine  COMPLICATIONS: None immediate. PROCEDURE: An ultrasound guided thoracentesis was thoroughly  discussed with the patient and questions answered. The benefits, risks, alternatives and complications were also discussed. The patient understands and wishes to proceed with the procedure. Written consent was obtained. Ultrasound was performed to localize and mark an adequate pocket of fluid in the left chest. The area was then prepped and draped in the normal sterile fashion. 1% Lidocaine  was used for local anesthesia. Under ultrasound guidance a 19 gauge, 7-cm, Yueh catheter was introduced. Thoracentesis was performed. The catheter was removed and a dressing applied. FINDINGS: A total of approximately 125 mL of clear, serosanguineous fluid was removed. Due to highly loculated nature of the effusion,  no further fluid was able to removed. samples were sent to the laboratory as requested by the clinical team. IMPRESSION: Successful ultrasound guided left thoracentesis yielding 125 mL of pleural fluid. Performed by Clotilda Hesselbach, PA-C Electronically Signed   By: CHRISTELLA.  Shick M.D.   On: 10/30/2024 11:54   ECHOCARDIOGRAM COMPLETE Result Date: 10/29/2024    ECHOCARDIOGRAM REPORT   Patient Name:   SHARDAY MICHL Date of Exam: 10/29/2024 Medical Rec #:  [id number]     Height:       60.0 in Accession #:    [id number]    Weight:       121.0 lb Date of Birth:  [birth year]    BSA:          1.508 m Patient Age:    83 years      BP:           122/55 mmHg Patient Gender: F             HR:           106 bpm. Exam Location:  [facility] Procedure: 2D Echo, Cardiac Doppler and Color Doppler (Both Spectral and Color            Flow Doppler were utilized during procedure). Indications:    TIA G45.9  History:        Patient has no prior history of Echocardiogram examinations.                 Risk Factors:Hypertension.  Sonographer:    Jayson Gaskins Referring Phys: [id number] CAROLE N HALL IMPRESSIONS  1. Left ventricular ejection fraction, by estimation, is 60 to 65%. The left ventricle has normal function. The left ventricle has no  regional wall motion abnormalities. Left ventricular diastolic parameters are indeterminate.  2. Right ventricular systolic function is normal. The right ventricular size is normal. There is mildly elevated pulmonary artery systolic pressure. The estimated right ventricular systolic pressure is 43.2 mmHg.  3. The mitral valve is normal in structure. No evidence of mitral valve regurgitation. No evidence of mitral stenosis.  4. The aortic valve is normal in structure. Aortic valve regurgitation is not visualized. No aortic stenosis is present.  5. The inferior vena cava is normal in size with greater than 50% respiratory variability, suggesting right atrial pressure of 3 mmHg. FINDINGS  Left Ventricle: Left ventricular ejection fraction, by estimation, is 60 to 65%. The left ventricle has normal function. The left ventricle has no regional wall motion abnormalities. The left ventricular internal cavity size was normal in size. There is  no left ventricular hypertrophy. Left ventricular diastolic parameters are indeterminate. Right Ventricle: The right ventricular size is normal. No increase in right ventricular wall thickness. Right ventricular systolic function is normal. There is mildly elevated pulmonary artery systolic pressure. The tricuspid regurgitant velocity is 3.17  m/s, and with an assumed right atrial pressure of 3 mmHg, the estimated right ventricular systolic pressure is 43.2 mmHg. Left Atrium: Left atrial size was normal in size. Right Atrium: Right atrial size was normal in size. Pericardium: There is no evidence of pericardial effusion. Mitral Valve: The mitral valve is normal in structure. Mild to moderate mitral annular calcification. No evidence of mitral valve regurgitation. No evidence of mitral valve stenosis. Tricuspid Valve: The tricuspid valve is normal in structure. Tricuspid valve regurgitation is mild . No evidence of tricuspid stenosis. Aortic Valve: The aortic valve is normal in  structure. Aortic valve regurgitation is not visualized. No aortic  stenosis is present. Aortic valve mean gradient measures 2.0 mmHg. Aortic valve peak gradient measures 3.8 mmHg. Aortic valve area, by VTI measures 2.63 cm. Pulmonic Valve: The pulmonic valve was normal in structure. Pulmonic valve regurgitation is not visualized. No evidence of pulmonic stenosis. Aorta: The aortic root is normal in size and structure. Venous: The inferior vena cava is normal in size with greater than 50% respiratory variability, suggesting right atrial pressure of 3 mmHg. IAS/Shunts: No atrial level shunt detected by color flow Doppler.  LEFT VENTRICLE PLAX 2D LVIDd:         4.10 cm   Diastology LVIDs:         2.60 cm   LV e' medial:    5.87 cm/s LV PW:         0.80 cm   LV E/e' medial:  16.1 LV IVS:        1.00 cm   LV e' lateral:   6.53 cm/s LVOT diam:     1.80 cm   LV E/e' lateral: 14.5 LV SV:         52 LV SV Index:   35 LVOT Area:     2.54 cm  RIGHT VENTRICLE RV S prime:     18.40 cm/s TAPSE (M-mode): 2.6 cm LEFT ATRIUM             Index        RIGHT ATRIUM           Index LA Vol (A2C):   24.6 ml 16.32 ml/m  RA Area:     14.30 cm LA Vol (A4C):   66.5 ml 44.11 ml/m  RA Volume:   35.00 ml  23.22 ml/m LA Biplane Vol: 42.9 ml 28.46 ml/m  AORTIC VALVE AV Area (Vmax):    2.70 cm AV Area (Vmean):   2.74 cm AV Area (VTI):     2.63 cm AV Vmax:           98.00 cm/s AV Vmean:          70.800 cm/s AV VTI:            0.198 m AV Peak Grad:      3.8 mmHg AV Mean Grad:      2.0 mmHg LVOT Vmax:         104.00 cm/s LVOT Vmean:        76.100 cm/s LVOT VTI:          0.205 m LVOT/AV VTI ratio: 1.04  AORTA Ao Root diam: 2.70 cm MITRAL VALVE                TRICUSPID VALVE MV Area (PHT): 4.04 cm     TR Peak grad:   40.2 mmHg MV Decel Time: 188 msec     TR Vmax:        317.00 cm/s MV E velocity: 94.40 cm/s MV A velocity: 107.00 cm/s  SHUNTS MV E/A ratio:  0.88         Systemic VTI:  0.20 m                             Systemic Diam: 1.80 cm  Wilbert Bihari MD Electronically signed by Wilbert Bihari MD Signature Date/Time: 10/29/2024/11:21:50 AM    Final    CT ANGIO HEAD NECK W WO CM Addendum Date: 10/29/2024 ADDENDUM #1  ADDENDUM: The study is reviewed because AIdoc software detected a questionable aneurysm arising  from the anterior communicating artery which was not appreciated or described in the original report. 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 A follow up CT angiogram of the head in 1 to 2 years is recommended to ensure stability. ---------------------------------------------------- Electronically signed by: Evalene Coho MD 10/29/2024 10:04 AM EST RP Workstation: [id number]   Result Date: 10/29/2024  ORIGINAL REPORT EXAM: CTA HEAD AND NECK WITH AND WITHOUT 10/29/2024 06:19:28 AM TECHNIQUE: CTA of the head and neck was performed with and without the administration of 75 mL of iohexol  (OMNIPAQUE ) 350 MG/ML injection. Multiplanar 2D and/or 3D reformatted images are provided for review. Automated exposure control, iterative reconstruction, and/or weight based adjustment of the mA/kV was utilized to reduce the radiation dose to as low as reasonably achievable. Stenosis of the internal carotid arteries measured using NASCET criteria. COMPARISON: CT of the head dated 10/29/2024 CLINICAL HISTORY: Transient ischemic attack (TIA) FINDINGS: CTA NECK: AORTIC ARCH AND ARCH VESSELS: Ulcerative plaque present within the distal aortic arch. Moderate calcific plaque present. Standard 3-vessel takeoff of the great arteries. No dissection or arterial injury. No significant stenosis of the brachiocephalic or subclavian arteries. The ulcerative and moderate calcific plaque in the distal aortic arch represents a potential source of emboli. Medical management for atherosclerosis is recommended. CERVICAL CAROTID  ARTERIES: Extensive calcific plaque within the origin of the left internal carotid artery with critical stenosis, greater than 90%. Calcific plaque within the proximal right internal carotid artery with less than 30% luminal stenosis. The left external carotid artery is patent without significant stenosis. No dissection or arterial injury. The critical left internal carotid artery stenosis is a significant finding that may be related to the patient's transient ischemic attack (TIA). Consideration for carotid endarterectomy (CEA) or carotid artery stenting (CAS) is recommended, depending on patient symptoms and risk factors. Further evaluation with dedicated carotid ultrasound or MRA may be warranted. CERVICAL VERTEBRAL ARTERIES: The left vertebral artery is dominant and the right vertebral artery is hypoplastic but patent. No dissection, arterial injury, or significant stenosis. LUNGS AND MEDIASTINUM: Moderate central lobular emphysema. Coarse consolidation of the left upper lung. Clinical correlation and potential follow-up imaging (e.g., dedicated chest CT) for the lung findings. SOFT TISSUES: No acute abnormality. BONES: No acute abnormality. CTA HEAD: ANTERIOR CIRCULATION: Calcific plaque within the carotid siphons bilaterally with less than 20% luminal stenosis. No significant stenosis of the anterior cerebral arteries. No significant stenosis of the middle cerebral arteries. No aneurysm. POSTERIOR CIRCULATION: Fetal type origin of the right posterior cerebral artery with a diminutive right P1 segment. No significant stenosis of the posterior cerebral arteries. No significant stenosis of the basilar artery. No significant stenosis of the vertebral arteries. No aneurysm. OTHER: No dural venous sinus thrombosis on this non-dedicated study. IMPRESSION: 1. 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 2. Less than 30% stenosis of the proximal right internal carotid artery and less than 20% stenosis of the carotid siphons bilaterally due to calcific plaque. 3. Ulcerative plaque and moderate calcific plaque in the distal aortic arch, which can be a potential embolic source. 4. Fetal type origin of the right posterior cerebral artery with a diminutive right P1 segment. 5. Moderate centrilobular emphysema and coarse left upper lobe consolidation. Electronically signed by:  Evalene Coho MD 10/29/2024 06:57 AM EST RP Workstation: [id number]   MR BRAIN W WO CONTRAST Result Date: 10/29/2024 EXAM: MRI BRAIN WITH AND WITHOUT CONTRAST 10/29/2024 09:14:24 AM TECHNIQUE: Multiplanar multisequence MRI of the head/brain was performed with and without the administration of 5 mL (gadobutrol  (GADAVIST ) 1 MMOL/ML injection 5 mL GADOBUTROL  1 MMOL/ML IV SOLN) intravenous contrast. COMPARISON: CT of the head dated 10/29/2024. CLINICAL HISTORY: Transient ischemic attack (TIA). FINDINGS: BRAIN AND VENTRICLES: There is age-related atrophy and mild-to-moderate periventricular and subcortical white matter disease. There is no restricted diffusion to indicate acute or recent infarction. No acute intracranial hemorrhage. No mass effect or midline shift. No hydrocephalus. The sella is unremarkable. Normal flow voids. There is no abnormal parenchymal or meningeal enhancement present. ORBITS: No acute abnormality. SINUSES: No acute abnormality. BONES AND SOFT TISSUES: Normal bone marrow signal and enhancement. No acute soft tissue abnormality. IMPRESSION: 1. No acute intracranial abnormality. No restricted diffusion to indicate acute or recent infarction. 2. Age-related atrophy and mild-to-moderate periventricular and subcortical white matter disease. No abnormal parenchymal or meningeal enhancement. Electronically signed by: Evalene Coho MD 10/29/2024 09:58 AM EST RP Workstation: [id number]   CT  CHEST WO CONTRAST Result Date: 10/29/2024 EXAM: CT CHEST WITH CONTRAST 10/29/2024 06:19:28 AM TECHNIQUE: CT of the chest was performed with the administration of 75 mL of iohexol  (OMNIPAQUE ) 350 MG/ML intravenous contrast. Multiplanar reformatted images are provided for review. Automated exposure control, iterative reconstruction, and/or weight based adjustment of the mA/kV was utilized to reduce the radiation dose to as low as reasonably achievable. COMPARISON: 01/07/2022 CLINICAL HISTORY: Abnormal xray - lung opacity/opacities. FINDINGS: MEDIASTINUM: Aortic atherosclerosis and coronary artery calcifications. Soft tissue nodularity within the epicardial fat overlying the left ventricular apex. Index nodule measures 0.9 cm (image 116/2). Nodular thickening of the pericardium is also noted. Index pericardial nodule anterior to the main pulmonary artery, measures 1.4 x 0.5 cm. No pericardial effusion identified. The central airways are clear. LYMPH NODES: Pre-vascular lymph node measures 1.5 cm (image 64/2). Right paratracheal lymph node which measures 1.3 cm (image 65/2). Subcarinal node measures 1.7 cm (image 77/2). Hilar lymph nodes are suboptimimally evaluated due to lack of iv contrast. No axillary lymphadenopathy. LUNGS AND PLEURA: Debris containing gas is noted within the distal left mainstem bronchus extending into the upper and lower lobe airways. A large loculated left pleural effusion is identified. Diffuse nodular pleural thickening overlies the entire left lung. There is diffuse soft tissue nodularity with interlobular septal thickening within the left upper lobe, with dense consolidative changes within the inferior left lower lobe and lingula. Partial atelectasis of the left lower lobe with scattered areas of nodular consolidation. Emphysematous changes are noted throughout the right lung. Posterior right base measures 4 mm (image 140/4). Right apical pleural parenchymal scarring. No pneumothorax. SOFT  TISSUES/BONES: No acute abnormality of the bones or soft tissues. UPPER ABDOMEN: Limited images of the upper abdomen demonstrates no acute abnormality. Punctate stone noted within the interpolar collecting system of the left kidney. Extensive aortic atherosclerotic calcifications. IMPRESSION: 1. Large loculated left pleural effusion with diffuse nodular pleural thickening, most concerning for malignant pleural disease such as pleural metastases or mesothelioma, with empyema less likely. 2. 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 3. Mediastinal lymphadenopathy, suspicious for nodal metastatic disease. 4. Epicardial fat and pericardial nodularity, suspicious for metastatic disease. 5. Recommend  pulmonology consultation for diagnostic thoracentesis with cytology and consideration of pleural biopsy, and consider PET/CT for further evaluation and staging. Electronically signed by: Waddell Calk MD 10/29/2024 06:38 AM EST RP Workstation: [id number]   DG Chest 2 View Result Date: 10/29/2024 EXAM: 2 VIEW(S) XRAY OF THE CHEST 10/29/2024 12:30:10 AM COMPARISON: Comparison 11/07/2009. CT 01/07/2022. CLINICAL HISTORY: cough FINDINGS: LUNGS AND PLEURA: Emphysema with hyperinflation. Consolidation throughout the left lung concerning for pneumonia. Underlying chronic lung disease/fibrosis. Possible left pleural effusion. No pneumothorax. HEART AND MEDIASTINUM: No acute abnormality of the cardiac and mediastinal silhouettes. BONES AND SOFT TISSUES: No acute osseous abnormality. IMPRESSION: 1. Consolidation throughout the left lung concerning for pneumonia. 2. Possible left pleural effusion. 3. Emphysema/chronic changes Electronically signed by: Franky Crease MD 10/29/2024 12:34 AM EST RP Workstation: [id number]   CT Head  Wo Contrast Result Date: 10/29/2024 EXAM: CT HEAD WITHOUT 10/29/2024 12:23:26 AM TECHNIQUE: CT of the head was performed without the administration of intravenous contrast. Automated exposure control, iterative reconstruction, and/or weight based adjustment of the mA/kV was utilized to reduce the radiation dose to as low as reasonably achievable. COMPARISON: None available. CLINICAL HISTORY: Neuro deficit, acute, stroke suspected Neuro deficit, acute, stroke suspected FINDINGS: BRAIN AND VENTRICLES: No acute intracranial hemorrhage. No mass effect or midline shift. No extra-axial fluid collection. No evidence of acute infarct. No hydrocephalus. ORBITS: No acute abnormality. SINUSES AND MASTOIDS: No acute abnormality. SOFT TISSUES AND SKULL: No acute skull fracture. No acute soft tissue abnormality. IMPRESSION: 1. No acute intracranial abnormality. Electronically signed by: Franky Crease MD 10/29/2024 12:27 AM EST RP Workstation: [id number]    Scheduled Meds:  aspirin  EC  81 mg Oral Daily   azithromycin   500 mg Oral Daily   budesonide  (PULMICORT ) nebulizer solution  0.5 mg Nebulization BID   clopidogrel   75 mg Oral Daily   dextromethorphan -guaiFENesin   1 tablet Oral BID   enoxaparin  (LOVENOX ) injection  40 mg Subcutaneous Q24H   feeding supplement  237 mL Oral BID BM   ipratropium-albuterol   3 mL Nebulization BID   methylPREDNISolone  (SOLU-MEDROL ) injection  40 mg Intravenous Q12H   pantoprazole   40 mg Oral BID   rosuvastatin   20 mg Oral Daily   Continuous Infusions:  cefTRIAXone  (ROCEPHIN )  IV 2 g (10/31/24 2048)     LOS: 3 days    Time spent: 50 minutes    Derryl Duval, MD [facility]   To contact the attending provider between 7A-7P or the covering provider during after hours 7P-7A, please log into the web site [email or web address] and access using universal [place] password for that web site. If you do not have the password, please call the hospital operator.  11/01/2024, 4:31  PM    "

## 2024-11-01 NOTE — Assessment & Plan Note (Addendum)
-   Condition likely exacerbated in the setting of pneumonia/bronchitis/bronchiectasis. - Continue bronchodilator management - Continue IV Solu-Medrol . - Continue current antibiotics - Continue oxygen supplementation - Follow clinical response.

## 2024-11-01 NOTE — Assessment & Plan Note (Signed)
-   Aneurysm arising from [facility] - Following radiology recommendations, CT angiogram of the head in 1 to 2 years is recommended to ensure stability

## 2024-11-01 NOTE — Progress Notes (Signed)
 Mobility Specialist Progress Note:    11/01/24 1610  Mobility  Activity Ambulated with assistance  Level of Assistance Contact guard assist, steadying assist  Assistive Device Front wheel walker  Distance Ambulated (ft) 35 ft  Range of Motion/Exercises Active;All extremities  Activity Response Tolerated well  Mobility Referral Yes  Mobility visit 1 Mobility  Mobility Specialist Start Time (ACUTE ONLY) 1610  Mobility Specialist Stop Time (ACUTE ONLY) 1630  Mobility Specialist Time Calculation (min) (ACUTE ONLY) 20 min   Pt received in chair, agreeable to mobility. Required CGA to stand and ambulate with RW. Tolerated well, c/o back pain. Left sitting EOB, alarm on. Call bell in reach, all needs met.  Brandi Mcmahon Mobility Specialist Please contact via Special Educational Needs Teacher or  Rehab office at [phone]

## 2024-11-01 NOTE — Plan of Care (Signed)

## 2024-11-01 NOTE — Plan of Care (Signed)
" °  Problem: Education: Goal: Knowledge of General Education information will improve Description: Including pain rating scale, medication(s)/side effects and non-pharmacologic comfort measures Outcome: Progressing   Problem: Health Behavior/Discharge Planning: Goal: Ability to manage health-related needs will improve Outcome: Progressing   Problem: Clinical Measurements: Goal: Ability to maintain clinical measurements within normal limits will improve Outcome: Progressing Goal: Will remain free from infection Outcome: Progressing Goal: Diagnostic test results will improve Outcome: Progressing Goal: Respiratory complications will improve Outcome: Progressing Goal: Cardiovascular complication will be avoided Outcome: Progressing   Problem: Activity: Goal: Risk for activity intolerance will decrease Outcome: Progressing   Problem: Nutrition: Goal: Adequate nutrition will be maintained Outcome: Progressing   Problem: Elimination: Goal: Will not experience complications related to urinary retention Outcome: Progressing   Problem: Pain Managment: Goal: General experience of comfort will improve and/or be controlled Outcome: Progressing   Problem: Safety: Goal: Ability to remain free from injury will improve Outcome: Progressing   Problem: Coping: Goal: Level of anxiety will decrease Outcome: Not Progressing   "

## 2024-11-01 NOTE — Progress Notes (Signed)
 Report called to 6N RN at [facility]. Mehgan Santmyer S Joshuwa Vecchio

## 2024-11-01 NOTE — Assessment & Plan Note (Signed)
"   Continue statin   "

## 2024-11-01 NOTE — Assessment & Plan Note (Signed)
-   Neurologic symptoms completely resolved - Patient workup demonstrating no acute stroke - Left carotid artery stenosis (about 90% appreciated on CT angiogram of the neck. - Based on findings and presentation case discussed with neurology service who recommended the use of aspirin  and Plavix  for secondary prevention - Close outpatient follow-up with vascular surgery to discuss with patient and recommended by neurology service.

## 2024-11-01 NOTE — Assessment & Plan Note (Signed)
 Loculated pleural effusion left Rule out malignancy - Present at time of admission with what appears to be parapneumonic effusion, malignancy not ruled out. - CT with findings as above -Pleural fluid appears exudative, culture so far negative. -Patient is not toxic, hemodynamically stable and at baseline oxygen 2 L/min. -Continue symptomatic management.

## 2024-11-01 NOTE — Assessment & Plan Note (Signed)
-   Stable - Continue current antihypertensive agents - Follow vital signs.

## 2024-11-01 NOTE — Assessment & Plan Note (Signed)
"  Continue PPI  "

## 2024-11-01 NOTE — Consult Note (Signed)
 "  NAME:  Brandi Mcmahon, MRN:  [id number], DOB:  02/07/1941, LOS: 3 ADMISSION DATE:  10/28/2024, CONSULTATION DATE:  10/31/2024 REFERRING MD:  TRH, CHIEF COMPLAINT: Loculated left pleural effusion  History of Present Illness:  Brandi Mcmahon is a 84 year old female with a past medical history significant for HTN, HLD, GERD, left breast cancer, and anxiety who initially presented to the ED at [facility] for reports of weakness.  Additionally she reported 1 week of productive cough with green sputum and congestion.  On ED arrival patient was seen mildly tachypneic, tachycardic, and slightly hypertensive.  Chest x-ray was obtained and revealed consolidation throughout the left lung concerning for pneumonia and possible left pleural effusion.  Additional imaging was obtained with CT chest which revealed a large loculated left pleural effusion with diffuse nodular pleural thickening concerning for metastatic pleural disease with superimposed pneumonia.  Patient was admitted per hospitalist for management of CAP   Pertinent  Medical History  HTN, HLD, GERD, left breast cancer, and anxiety   Significant Hospital Events: Including procedures, antibiotic start and stop dates in addition to other pertinent events   1/4 initially presented for weakness workup revealed significant left pneumonia consistent with CAP and large loculated pleural effusion 1/5 patient underwent thoracentesis per interventional radiology with only 125 mL of serosanguineous fluid removed due to highly loculated effusion 1/7 PCCM consulted for potential need of chest tube and pleural lytics, patient transferred from [facility]  Interim History / Subjective:  As above   Objective    Blood pressure (!) 157/79, pulse 97, temperature 97.9 F (36.6 C), temperature source Oral, resp. rate 18, height 5' (1.524 m), weight 54.9 kg, SpO2 94%.        Intake/Output Summary (Last 24 hours) at 11/01/2024 1357 Last data filed at 11/01/2024  0900 Gross per 24 hour  Intake 120 ml  Output 500 ml  Net -380 ml   Filed Weights   10/29/24 0335  Weight: 54.9 kg    Examination: General: Acute on chronically ill appearing elderly female lying in bed, in NAD HEENT: [place]/AT, MM pink/moist, PERRL,  Neuro: Alert and oriented x3, non-focal  CV: s1s2 regular rate and rhythm, no murmur, rubs, or gallops,  PULM:  Diminished left base, no increase work of breathing, no added breath sounds  GI: soft, bowel sounds active in all 4 quadrants, non-tender, non-distended, tolerating oral diet Extremities: warm/dry, no edema  Skin: no rashes or lesions  Resolved problem list   Assessment and Plan  Acute on chronic hypoxic respiratory failure Large loculated left pleural effusion  Community-acquired pneumonia, POA -CT chest which revealed a large loculated left pleural effusion with diffuse nodular pleural thickening concerning for metastatic pleural disease with superimposed pneumonia. -1/5 underwent thoracentesis with interventional radiology but I was only able to remove 125 mL given loculated effusion, does not appear any pleural studies were collected.  Pleural fluid culture negative Former smoker P: Left chest tube placed at bedside  Limited fluid removed given loculated nature of fluid  Will pleural lytics later today  Obtain pleural fluid studies including cytology Continue ceftriaxone  and azithromycin  Bronchodilators Continue steroids  Labs   CBC: Recent Labs  Lab 10/28/24 2331 10/29/24 0650 11/01/24 0446  WBC 12.7* 10.1 11.5*  NEUTROABS 10.6* 7.2  --   HGB 9.0* 7.2* 7.4*  HCT 31.4* 24.2* 25.4*  MCV 75.5* 74.9* 74.5*  PLT 633* 516* 641*    Basic Metabolic Panel: Recent Labs  Lab 10/28/24 2331 10/29/24 0650 11/01/24  0446  NA 137 135 139  K 3.7 3.4* 3.5  CL 98 98 100  CO2 29 27 32  GLUCOSE 124* 101* 160*  BUN 14 13 22   CREATININE 0.93 0.83 0.71  CALCIUM  9.2 8.4* 8.1*  MG  --  1.5*  --   PHOS  --  3.1  --     GFR: Estimated Creatinine Clearance: 41.5 mL/min (by C-G formula based on SCr of 0.71 mg/dL). Recent Labs  Lab 10/28/24 2331 10/29/24 0650 11/01/24 0446  WBC 12.7* 10.1 11.5*    Liver Function Tests: Recent Labs  Lab 10/29/24 0650  AST 28  ALT 10  ALKPHOS 118  BILITOT 0.2  PROT 6.1*  ALBUMIN 3.1*   No results for input(s): LIPASE, AMYLASE in the last 168 hours. No results for input(s): AMMONIA in the last 168 hours.  ABG    Component Value Date/Time   TCO2 26 09/15/2021 0733     Coagulation Profile: Recent Labs  Lab 10/28/24 2331  INR 1.1    Cardiac Enzymes: No results for input(s): CKTOTAL, CKMB, CKMBINDEX, TROPONINI in the last 168 hours.  HbA1C: Hgb A1c MFr Bld  Date/Time Value Ref Range Status  10/29/2024 06:50 AM 5.5 4.8 - 5.6 % Final    Comment:    (NOTE) Diagnosis of Diabetes The following HbA1c ranges recommended by the American Diabetes Association (ADA) may be used as an aid in the diagnosis of diabetes mellitus.  Hemoglobin             Suggested A1C NGSP%              Diagnosis  <5.7                   Non Diabetic  5.7-6.4                Pre-Diabetic  >6.4                   Diabetic  <7.0                   Glycemic control for                       adults with diabetes.      CBG: No results for input(s): GLUCAP in the last 168 hours.  Review of Systems:   Please see the history of present illness. All other systems reviewed and are negative    Past Medical History:  She,  has a past medical history of Allergic rhinitis, Allergy, Anxiety, Arthritis, Cancer (HCC) (2011), GERD (gastroesophageal reflux disease), Hyperlipidemia, Hypertension, and Shingles.   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   BUNIONECTOMY  2009   CARDIAC CATHETERIZATION     ~ 2004, reported results unremarkable and shoulder symptoms due to bursitis   COLONOSCOPY  2019   COLONOSCOPY WITH PROPOFOL  N/A 10/23/2020   Procedure: COLONOSCOPY WITH PROPOFOL ;  Surgeon: Wilhelmenia Aloha Raddle., MD;  Location: [facility];  Service: Gastroenterology;  Laterality: N/A;   ENDARTERECTOMY FEMORAL Right 02/09/2022   Procedure: RIGHT COMMON FEMORAL ENDARTERECTOMY WITH PROFUNDA PLASTY AND SUPERFICIAL FEMORAL ARTERY ENDARTERECTOMY;  Surgeon: Gretta Lonni PARAS, MD;  Location: [facility] OR;  Service: Vascular;  Laterality: Right;   HEMOSTASIS CLIP PLACEMENT  10/23/2020   Procedure: HEMOSTASIS CLIP PLACEMENT;  Surgeon: Wilhelmenia Aloha Raddle., MD;  Location: [facility];  Service: Gastroenterology;;   INSERTION OF ILIAC STENT Bilateral 02/09/2022   Procedure: BILATERAL COMMON ILIAC ANGIOPLASTY WITH STENTS;  Surgeon: Gretta Lonni PARAS, MD;  Location: [facility] OR;  Service: Vascular;  Laterality: Bilateral;   INTRAOPERATIVE ARTERIOGRAM Right 02/09/2022   Procedure: RIGHT LOWER EXTREMITY ARTERIOGRAM;  Surgeon: Gretta Lonni PARAS, MD;  Location: [facility] OR;  Service: Vascular;  Laterality: Right;   LUMBAR LAMINECTOMY/DECOMPRESSION MICRODISCECTOMY Right 06/18/2021   Procedure: Laminectomy and Foraminotomy - right - Lumbar four-Lumbar five for resection of synovial cyst;  Surgeon: Onetha Kuba, MD;  Location: [facility] OR;  Service: Neurosurgery;  Laterality: Right;   PATCH ANGIOPLASTY Right 02/09/2022   Procedure: PATCH ANGIOPLASTY USING XENOSURE 1cm x 14cm BIOLOGIC PATCH;  Surgeon: Gretta Lonni PARAS, MD;  Location: [facility] OR;  Service: Vascular;  Laterality: Right;   POLYPECTOMY  10/23/2020   Procedure: POLYPECTOMY;  Surgeon: Wilhelmenia Aloha Raddle., MD;  Location: [facility];  Service: Gastroenterology;;   SUBMUCOSAL LIFTING INJECTION  10/23/2020   Procedure: SUBMUCOSAL LIFTING INJECTION;  Surgeon: Wilhelmenia Aloha Raddle.,  MD;  Location: [facility];  Service: Gastroenterology;;   SUBMUCOSAL TATTOO INJECTION  10/23/2020   Procedure: SUBMUCOSAL TATTOO INJECTION;  Surgeon: Wilhelmenia Aloha Raddle., MD;  Location: [facility];  Service: Gastroenterology;;   TOTAL ABDOMINAL HYSTERECTOMY  1987   ULTRASOUND GUIDANCE FOR VASCULAR ACCESS Bilateral 02/09/2022   Procedure: ULTRASOUND GUIDANCE FOR VASCULAR ACCESS LEFT COMMON FEMORAL ARTERY;  Surgeon: Gretta Lonni PARAS, MD;  Location: [facility] OR;  Service: Vascular;  Laterality: Bilateral;     Social History:   reports that she has quit smoking. Her smoking use included cigarettes. She has never used smokeless tobacco. She reports that she does not currently use alcohol . She reports that she does not use drugs.   Family History:  Her family history includes Breast cancer in her mother; Lung cancer in her father. There is no history of Colon cancer, Colon polyps, Esophageal cancer, Rectal cancer, Stomach cancer, Inflammatory bowel disease, Liver disease, or Pancreatic cancer.   Allergies Allergies[1]   Home Medications  Prior to Admission medications  Medication Sig Start Date End Date Taking? Authorizing Provider  albuterol  (VENTOLIN  HFA) 108 (90 Base) MCG/ACT inhaler Inhale 1-2 puffs into the lungs 4 (four) times daily as needed for shortness of breath or wheezing. 05/07/20  Yes [provider]  Cholecalciferol  (VITAMIN D -1000 MAX ST) 25 MCG (1000 UT) tablet Take 25 mcg by mouth.   Yes [provider]  clopidogrel  (PLAVIX ) 75 MG tablet Take 75 mg by mouth daily.   Yes [provider]  diazepam  (VALIUM ) 5 MG tablet Take 5 mg by mouth 3 (three) times daily as needed for anxiety. 02/27/20  Yes [provider]  lisinopril  (ZESTRIL ) 20 MG tablet Take 20 mg by mouth daily. 03/10/20  Yes [provider]  MYRBETRIQ  50 MG TB24 tablet Take 50 mg by mouth daily. 05/21/17  Yes [provider]  pantoprazole  (PROTONIX ) 40 MG tablet Take  1 tablet (40 mg total) by mouth daily. 06/01/24  Yes Beather Delon Gibson, PA  polyethylene glycol powder (GLYCOLAX /MIRALAX ) 17 GM/SCOOP powder Take by mouth once.   Yes [provider]  rosuvastatin  (CRESTOR ) 20 MG tablet Take 20 mg by mouth daily. 04/03/22  Yes [provider]  acetaminophen  (TYLENOL ) 500 MG tablet Take 500-1,000 mg by mouth every 6 (six) hours as needed for  moderate pain. Patient not taking: No sig reported    [provider]  aspirin  EC 81 MG EC tablet Take 1 tablet (81 mg total) by mouth daily. Swallow whole. Patient not taking: Reported on 10/30/2024 02/12/22   Baglia, Corrina, PA-C  Cetirizine HCl (ZYRTEC ALLERGY) 10 MG CAPS Take 10 mg by mouth daily. Patient not taking: Reported on 10/30/2024 05/21/17   [provider]  diphenhydramine -acetaminophen  (TYLENOL  PM) 25-500 MG TABS tablet Take 1-2 tablets by mouth at bedtime as needed (sleep / pain). Patient not taking: Reported on 10/30/2024    [provider]  docusate sodium  (COLACE) 100 MG capsule Take 100-200 mg by mouth at bedtime as needed for mild constipation. Patient not taking: Reported on 10/30/2024    [provider]  escitalopram (LEXAPRO) 5 MG tablet Take 5 mg by mouth daily. Patient not taking: Reported on 10/30/2024 09/08/24   [provider]  gabapentin  (NEURONTIN ) 100 MG capsule Take 100 mg by mouth 2 (two) times daily. Patient not taking: Reported on 10/30/2024 08/01/21   [provider]  melatonin 3 MG TABS tablet Take 3-6 mg by mouth at bedtime as needed (sleep). Patient not taking: Reported on 10/30/2024    [provider]  mometasone (NASONEX) 50 MCG/ACT nasal spray Place 2 sprays into the nose daily as needed (allergies). Patient not taking: Reported on 10/30/2024 01/18/20   [provider]  montelukast  (SINGULAIR ) 10 MG tablet Take 10 mg by mouth at bedtime. Patient not taking: Reported on 10/29/2024 03/11/20   [provider]   Multiple Vitamin (MULTI-VITAMIN) tablet Take 1 tablet by mouth daily. Patient not taking: Reported on 10/30/2024    [provider]  Omega-3 Fatty Acids (FISH OIL  ULTRA) 1400 MG CAPS Take 1,400 mg by mouth daily. Patient not taking: Reported on 10/30/2024    [provider]  polycarbophil (FIBERCON) 625 MG tablet Take 625 mg by mouth in the morning and at bedtime. Patient not taking: No sig reported    [provider]  sucralfate  (CARAFATE ) 1 g tablet Take 1 tablet (1 g total) by mouth 2 (two) times daily. Try Pantoprazole  for 1 week before starting Carafate  Patient not taking: Reported on 10/30/2024 02/10/23   Mansouraty, Aloha Raddle., MD     Critical care time: NA  Hannahgrace Lalli D. Harris, NP-C [facility] Personal contact information can be found on Amion  If no contact or response made please call [phone] 11/01/2024, 3:46 PM             [1]  Allergies Allergen Reactions   Augmentin [Amoxicillin-Pot Clavulanate] Other (See Comments)    ABDONIMAL PAIN    Clavulanic Acid Hives   Codeine Hives and Other (See Comments)   Alprazolam Other (See Comments)    OVER SEDATES    Bee Venom Hives   Sulfa Antibiotics Rash and Other (See Comments)   Tramadol      Insomnia    Avelox [Moxifloxacin] Rash   Hydrocodone-Acetaminophen  Other (See Comments)    Insomnia    "

## 2024-11-02 ENCOUNTER — Inpatient Hospital Stay (HOSPITAL_COMMUNITY)

## 2024-11-02 DIAGNOSIS — E43 Unspecified severe protein-calorie malnutrition: Secondary | ICD-10-CM | POA: Insufficient documentation

## 2024-11-02 DIAGNOSIS — J90 Pleural effusion, not elsewhere classified: Secondary | ICD-10-CM | POA: Diagnosis not present

## 2024-11-02 DIAGNOSIS — G459 Transient cerebral ischemic attack, unspecified: Secondary | ICD-10-CM | POA: Diagnosis not present

## 2024-11-02 DIAGNOSIS — J189 Pneumonia, unspecified organism: Secondary | ICD-10-CM | POA: Diagnosis not present

## 2024-11-02 LAB — COMPREHENSIVE METABOLIC PANEL WITH GFR
ALT: 82 U/L — ABNORMAL HIGH (ref 0–44)
AST: 75 U/L — ABNORMAL HIGH (ref 15–41)
Albumin: 2.9 g/dL — ABNORMAL LOW (ref 3.5–5.0)
Alkaline Phosphatase: 115 U/L (ref 38–126)
Anion gap: 10 (ref 5–15)
BUN: 26 mg/dL — ABNORMAL HIGH (ref 8–23)
CO2: 29 mmol/L (ref 22–32)
Calcium: 8.3 mg/dL — ABNORMAL LOW (ref 8.9–10.3)
Chloride: 99 mmol/L (ref 98–111)
Creatinine, Ser: 0.71 mg/dL (ref 0.44–1.00)
GFR, Estimated: >60 mL/min
Glucose, Bld: 79 mg/dL (ref 70–99)
Potassium: 3.4 mmol/L — ABNORMAL LOW (ref 3.5–5.1)
Sodium: 138 mmol/L (ref 135–145)
Total Bilirubin: <0.2 mg/dL (ref 0.0–1.2)
Total Protein: 5.6 g/dL — ABNORMAL LOW (ref 6.5–8.1)

## 2024-11-02 LAB — IRON AND TIBC
Iron: 21 ug/dL — ABNORMAL LOW (ref 28–170)
Saturation Ratios: 7 % — ABNORMAL LOW (ref 10.4–31.8)
TIBC: 329 ug/dL (ref 250–450)
UIBC: 308 ug/dL

## 2024-11-02 LAB — RETICULOCYTES
Immature Retic Fract: 27.8 % — ABNORMAL HIGH (ref 2.3–15.9)
RBC.: 3.41 MIL/uL — ABNORMAL LOW (ref 3.87–5.11)
Retic Count, Absolute: 68.5 K/uL (ref 19.0–186.0)
Retic Ct Pct: 2 % (ref 0.4–3.1)

## 2024-11-02 LAB — GRAM STAIN

## 2024-11-02 LAB — CBC
HCT: 24.9 % — ABNORMAL LOW (ref 36.0–46.0)
Hemoglobin: 7.4 g/dL — ABNORMAL LOW (ref 12.0–15.0)
MCH: 21.6 pg — ABNORMAL LOW (ref 26.0–34.0)
MCHC: 29.7 g/dL — ABNORMAL LOW (ref 30.0–36.0)
MCV: 72.8 fL — ABNORMAL LOW (ref 80.0–100.0)
Platelets: 585 K/uL — ABNORMAL HIGH (ref 150–400)
RBC: 3.42 MIL/uL — ABNORMAL LOW (ref 3.87–5.11)
RDW: 20.7 % — ABNORMAL HIGH (ref 11.5–15.5)
WBC: 10.1 K/uL (ref 4.0–10.5)
nRBC: 0 % (ref 0.0–0.2)

## 2024-11-02 LAB — BODY FLUID CELL COUNT WITH DIFFERENTIAL
Eos, Fluid: 0 %
Lymphs, Fluid: 92 %
Monocyte-Macrophage-Serous Fluid: 0 % — ABNORMAL LOW (ref 50–90)
Neutrophil Count, Fluid: 8 % (ref 0–25)
Total Nucleated Cell Count, Fluid: 23 uL (ref 0–1000)

## 2024-11-02 LAB — FERRITIN: Ferritin: 143 ng/mL (ref 11–307)

## 2024-11-02 LAB — FOLATE: Folate: 9.8 ng/mL

## 2024-11-02 LAB — VITAMIN B12: Vitamin B-12: 922 pg/mL — ABNORMAL HIGH (ref 180–914)

## 2024-11-02 LAB — MAGNESIUM: Magnesium: 1.3 mg/dL — ABNORMAL LOW (ref 1.7–2.4)

## 2024-11-02 MED ORDER — SODIUM CHLORIDE 0.9% FLUSH
10.0000 mL | Freq: Three times a day (TID) | INTRAVENOUS | Status: DC
  Administered 2024-11-02 – 2024-11-07 (×9): 10 mL via INTRAPLEURAL

## 2024-11-02 MED ORDER — FENTANYL CITRATE (PF) 50 MCG/ML IJ SOSY
12.5000 ug | PREFILLED_SYRINGE | INTRAMUSCULAR | Status: DC | PRN
  Administered 2024-11-03: 12.5 ug via INTRAVENOUS
  Filled 2024-11-02: qty 1

## 2024-11-02 MED ORDER — PROSOURCE PLUS PO LIQD
30.0000 mL | Freq: Two times a day (BID) | ORAL | Status: DC
  Administered 2024-11-02 – 2024-11-07 (×8): 30 mL via ORAL
  Filled 2024-11-02 (×8): qty 30

## 2024-11-02 MED ORDER — OXYCODONE HCL 5 MG PO TABS
5.0000 mg | ORAL_TABLET | ORAL | Status: DC | PRN
  Administered 2024-11-02 – 2024-11-05 (×2): 5 mg via ORAL
  Filled 2024-11-02 (×2): qty 1

## 2024-11-02 MED ORDER — STERILE WATER FOR INJECTION IJ SOLN
5.0000 mg | Freq: Once | RESPIRATORY_TRACT | Status: DC
  Filled 2024-11-02: qty 5

## 2024-11-02 MED ORDER — SODIUM CHLORIDE (PF) 0.9 % IJ SOLN
5.0000 mg | Freq: Once | INTRAMUSCULAR | Status: AC
  Administered 2024-11-02: 5 mg via INTRAPLEURAL
  Filled 2024-11-02: qty 5

## 2024-11-02 MED ORDER — MAGIC MOUTHWASH
5.0000 mL | Freq: Four times a day (QID) | ORAL | Status: DC | PRN
  Administered 2024-11-05 – 2024-11-07 (×4): 5 mL via ORAL
  Filled 2024-11-02 (×6): qty 5

## 2024-11-02 MED ORDER — STERILE WATER FOR INJECTION IJ SOLN
5.0000 mg | Freq: Once | RESPIRATORY_TRACT | Status: AC
  Administered 2024-11-02: 5 mg via INTRAPLEURAL
  Filled 2024-11-02: qty 5

## 2024-11-02 MED ORDER — BOOST / RESOURCE BREEZE PO LIQD CUSTOM
1.0000 | Freq: Three times a day (TID) | ORAL | Status: DC
  Administered 2024-11-02: 237 mL via ORAL
  Administered 2024-11-03 – 2024-11-06 (×6): 1 via ORAL

## 2024-11-02 MED ORDER — THIAMINE MONONITRATE 100 MG PO TABS
100.0000 mg | ORAL_TABLET | Freq: Every day | ORAL | Status: DC
  Administered 2024-11-02 – 2024-11-07 (×6): 100 mg via ORAL
  Filled 2024-11-02 (×6): qty 1

## 2024-11-02 MED ORDER — SODIUM CHLORIDE (PF) 0.9 % IJ SOLN
10.0000 mg | Freq: Once | INTRAMUSCULAR | Status: DC
  Filled 2024-11-02: qty 10

## 2024-11-02 MED ORDER — ADULT MULTIVITAMIN W/MINERALS CH
1.0000 | ORAL_TABLET | Freq: Every day | ORAL | Status: DC
  Administered 2024-11-02 – 2024-11-07 (×6): 1 via ORAL
  Filled 2024-11-02 (×6): qty 1

## 2024-11-02 MED ORDER — POTASSIUM CHLORIDE CRYS ER 20 MEQ PO TBCR
40.0000 meq | EXTENDED_RELEASE_TABLET | Freq: Once | ORAL | Status: AC
  Administered 2024-11-02: 40 meq via ORAL
  Filled 2024-11-02: qty 2

## 2024-11-02 MED ORDER — MAGNESIUM SULFATE 4 GM/100ML IV SOLN
4.0000 g | Freq: Once | INTRAVENOUS | Status: AC
  Administered 2024-11-02: 4 g via INTRAVENOUS
  Filled 2024-11-02: qty 100

## 2024-11-02 NOTE — Progress Notes (Signed)
 IV fluids and IVPB magnesium  on hold pending PIV placement. IV consult placed earlier in the shift.

## 2024-11-02 NOTE — Progress Notes (Signed)
 Patient asked about home meds lisinopril  and diazepam . This nurse spoke with Dr. Trenda Mar face-to-face about patient taking the home meds listed. MD stated, He will take a look at it when he gets a chance.

## 2024-11-02 NOTE — Plan of Care (Signed)
" °  Problem: Education: Goal: Knowledge of General Education information will improve Description: Including pain rating scale, medication(s)/side effects and non-pharmacologic comfort measures Outcome: Progressing   Problem: Nutrition: Goal: Adequate nutrition will be maintained Outcome: Progressing   Problem: Elimination: Goal: Will not experience complications related to urinary retention Outcome: Progressing   Problem: Skin Integrity: Goal: Risk for impaired skin integrity will decrease Outcome: Progressing   Problem: Health Behavior/Discharge Planning: Goal: Ability to manage health-related needs will improve Outcome: Progressing   "

## 2024-11-02 NOTE — Plan of Care (Signed)
  Problem: Activity: Goal: Risk for activity intolerance will decrease Outcome: Progressing   Problem: Coping: Goal: Level of anxiety will decrease Outcome: Progressing   Problem: Pain Managment: Goal: General experience of comfort will improve and/or be controlled Outcome: Progressing   Problem: Safety: Goal: Ability to remain free from injury will improve Outcome: Progressing

## 2024-11-02 NOTE — Progress Notes (Signed)
 Initial Nutrition Assessment  DOCUMENTATION CODES:   Severe malnutrition in context of acute illness/injury  INTERVENTION:   Encourage PO intake Room service with assist  Boost Breeze po TID, each supplement provides 250 kcal and 9 grams of protein Prosource Plus BID, each supplement provides 100 kcal, 15 gm protein Cottage cheese and greek yogurt added to all meal trays MVI with minerals daily 100 mg Thiamine  x 7 days  Electrolytes low, recommend monitoring magnesium , potassium, and phosphorus daily for at least 3 days, MD to replete as needed, as pt is at risk for refeeding syndrome  Requested updated standing weight  High calorie, high protein handouts in AVS  NUTRITION DIAGNOSIS:   Severe Malnutrition related to acute illness as evidenced by energy intake < or equal to 50% for > or equal to 5 days, severe muscle depletion, severe fat depletion.  GOAL:   Patient will meet greater than or equal to 90% of their needs  MONITOR:   PO intake, Supplement acceptance, Weight trends, Labs  REASON FOR ASSESSMENT:   Malnutrition Screening Tool    ASSESSMENT:   84 y.o female with PMH of tobacco abuse, chronic respiratory failure with hypoxia on 2 L/min [place] oxygen,chronic microcytic anemia, CAD, PAD, breast cancer, HTN, HLD, GERD.  Presented with 1 week history of cough and chest congestion, aphasia, facial drop. Concern for stroke CT, head negative, CT chest positive for large left pleural effusion concern for malignancy. Suspected pneumonia.  1/8 - Left chest tube placed, rule out malignancy, awaiting cytology results    Pt resting in chair with family bedside. Pt lives with her son and  daughter in law who prepares pt's meals for her. Pt has been feeling unwell for a couple weeks now with increased work of breathing and coughing. She has not had an appetite and per family was eating like a bird. Pt would eat 2 very small meals per day like grilled cheese with tomato soup, and  grits with eggs. Was not taking supplements at home.    Pt does no know UBW but family suspects she is around 100 lbs now. Requested new weight from nursing.   Pt reports improved appetite and po intake since admission, averaging 48% of her meals.  At baseline pt does not eat a lot of meat products but does eat some dairy products even with being lactose intolerant. Does avoid, milk and ice cream but likes foods such as greek yogurt and cottage cheese. Only had fruit and salad for dinner yesterday and a muffin for breakfast this morning.   Pt meets criteria for severe malnutrition based on poor po intake and severe muscle and fat deficits on exam. Expect pt with significant weight loss however, need new weight to access. Mag and phos low, MD supplementing.  Encouraged increased po intake and protein intake with pt and family. Pt does not like the Ensure but will drink Boost Breeze.Provided examples on ways to increase caloric density of foods and beverages. Discussed eating small frequent meals and snacks to assist in increasing overall po intake.  Admit weight: 54.9 kg - Stated? Current weight: 54.9 kg     Wt Readings from Last 10 Encounters:  10/29/24 54.9 kg  06/01/24 61.2 kg  04/19/23 57.6 kg  02/10/23 54 kg  01/19/23 54 kg  11/03/22 55.3 kg  06/23/22 56.4 kg  03/03/22 57.3 kg  02/09/22 56.7 kg  02/03/22 60.8 kg     Average Meal Intake: 1/4-1/7: 48% intake x 4 recorded meals  Nutritionally Relevant Medications: Scheduled Meds:  (feeding supplement) PROSource Plus  30 mL Oral BID BM   feeding supplement  1 Container Oral TID BM   multivitamin with minerals  1 tablet Oral Daily   potassium chloride   40 mEq Oral Once   thiamine   100 mg Oral Daily   Continuous Infusions:  cefTRIAXone  (ROCEPHIN )  IV 2 g (11/01/24 2115)   magnesium  sulfate bolus IVPB     Labs Reviewed: Potassium 3.4 (Low) Magnesium  1.3 (Low) BUN 26 CBG ranges from 79-160 mg/dL over the last 24 hours HgbA1c  5.5  NUTRITION - FOCUSED PHYSICAL EXAM:  Flowsheet Row Most Recent Value  Orbital Region Severe depletion  Upper Arm Region Severe depletion  Thoracic and Lumbar Region Moderate depletion  Buccal Region Severe depletion  Temple Region Moderate depletion  Clavicle Bone Region Severe depletion  Clavicle and Acromion Bone Region Severe depletion  Scapular Bone Region Severe depletion  Dorsal Hand Moderate depletion  Patellar Region Severe depletion  Anterior Thigh Region Severe depletion  Posterior Calf Region Severe depletion  Edema (RD Assessment) None  Hair Reviewed  Eyes Reviewed  Mouth Reviewed  [Wears dentures]  Skin Reviewed  Nails Reviewed    Diet Order:   Diet Order             Diet regular Room service appropriate? Yes; Fluid consistency: Thin  Diet effective now                   EDUCATION NEEDS:   Education needs have been addressed  Skin:  Skin Assessment: Reviewed RN Assessment  Last BM:  1/3  Height:   Ht Readings from Last 1 Encounters:  10/29/24 5' (1.524 m)    Weight:   Wt Readings from Last 1 Encounters:  10/29/24 54.9 kg    Ideal Body Weight:  45.4 kg  BMI:  Body mass index is 23.63 kg/m.  Estimated Nutritional Needs:   Kcal:  1500-1700 kcal  Protein:  70-90  Fluid:  >1.5L/day   Olivia Kenning, RD Registered Dietitian  See Amion for more information

## 2024-11-02 NOTE — Procedures (Signed)
 Pleural Fibrinolytic Administration Procedure Note  KEEANNA Mcmahon  [id number]  April 13, 1941  Date:11/02/2024  Time:2:22 PM   Provider Performing:Jimel Myler D. Harris   Procedure: Pleural Fibrinolysis Initial day (67438)  Indication(s) Fibrinolysis of complicated pleural effusion  Consent Risks of the procedure as well as the alternatives and risks of each were explained to the patient and/or caregiver.  Consent for the procedure was obtained.   Anesthesia None   Time Out Verified patient identification, verified procedure, site/side was marked, verified correct patient position, special equipment/implants available, medications/allergies/relevant history reviewed, required imaging and test results available.   Sterile Technique Hand hygiene, gloves   Procedure Description Existing pleural catheter was cleaned and accessed in sterile manner.  10mg  of tPA in 30cc of saline and 5mg  of dornase in 30cc of sterile water  were injected into pleural space using existing pleural catheter.  Catheter will be clamped for 1 hour and then placed back to suction.   Complications/Tolerance None; patient tolerated the procedure well.  EBL None   Specimen(s) None  Celsey Asselin D. Harris, NP-C [facility] Personal contact information can be found on Amion  If no contact or response made please call [phone] 11/02/2024, 2:23 PM

## 2024-11-02 NOTE — Progress Notes (Signed)
 Assisted with left chest tube placement. VSS stable throughout. Specimen taken to lab by staff. CT on suction. Dressing CDI. Tolerated well. CXR ordered.

## 2024-11-02 NOTE — Progress Notes (Addendum)
 " PROGRESS NOTE   Brandi Mcmahon  FMW:[id number]    DOB: 10-20-1941    DOA: 10/28/2024  PCP: Mavis Coy   I have briefly reviewed patients previous medical records in [facility].   Brief Hospital Course:  84 year old female lives with son and daughter-in-law, ambulates with the help of a walker, medical history significant for tobacco abuse, recently quit in September 2025, chronic respiratory failure with hypoxia on 2 L/min [place] oxygen, monoclonal gammopathy, chronic microcytic anemia, CAD, PAD s/p bilateral common iliac artery stent on Plavix , HTN, HLD, GERD, initially presented to [facility] with complaints of 1 week history of productive cough and chest congestion.  She also had an episode of aphasia, facial droop and slurred speech witnessed by her family prior to coming to the ED, episode lasting 10 minutes.  At [facility], sustained tachypnea up to 27/min, transient tachycardia, but no hypoxia documented.  Chest x-ray showed consolidation on the left side along with pleural effusion.  CT chest showed large loculated left pleural effusion and concern for malignant pleural disease such as pleural metastasis or mesothelioma.  S/p 125 mL ultrasound-guided thoracentesis, exudative fluid, cytology pending, case was discussed by TRH with PCCM on 1/7 and patient was transferred to [facility] for further evaluation and management.  Case had also been discussed with neurology for TIA who recommended DAPT and outpatient vascular surgery consultation for critical left ICA stenosis.  At [facility], PCCM consulted and plan left-sided chest tube.  Formally consulted neurology.  Formally consulted vascular surgery for evaluation of left critical ICA stenosis.   Assessment & Plan:   Suspected pneumonia with parapneumonic effusion (subacute to chronic) Concern for empyema and pulmonary fibrosis Rule out malignancy Has been on IV ceftriaxone  and azithromycin  since 1/3, continue as per PCCM Blood cultures x 2 negative. PCCM  consultation appreciated, planned left-sided chest tube, a dose of tPA DNase due to loculation, followed pleural fluid cytology results.  Chronic respiratory failure with hypoxia COPD exacerbation Stable on 2 L/min  oxygen, continue PCCM have started IV Solu-Medrol  40 mg daily. Continue bronchodilators.  Tobacco use disorder Quit September 2025.  Congratulated and encouraged continued cessation  TIA Suspected secondary to critical left ICA stenosis No recurrence of symptoms since hospital admission. CT head 1/4: No acute intracranial abnormality.  CTA head and neck 1/4: Critical stenosis (>90%) of the left ICA origin, less than 30% stenosis of proximal right ICA and less than 20% stenosis of the carotid siphons.  Ulcerative plaque and moderate calcific plaque in the distal aortic arch, which can be a potential embolic source. MRI brain 1/4: No acute intracranial abnormality. LDL 32.  A1c 5.5.  TTE 1/4: LVEF 60-65%. Therapies recommend home health PT.  No OT follow-up. Currently on aspirin  81 mg daily + Plavix  75 mg daily and rosuvastatin  20 mg daily. Requested formal neurology consult and vascular surgery consult.  May need intervention of left ICA stenosis this admission  Microcytic anemia Thrombocytosis Hemoglobin this admission is stable in the low 7 g range.  However anemia is worse since 2023 Check anemia panel.  Monitor CBCs periodically  Hypokalemia Hypomagnesemia Replace and follow.  GERD PPI  HLD LDL at goal.  Continue statins  Essential hypertension Mildly uncontrolled.  Monitor.  Monoclonal gammopathy Outpatient hematology follow-up.  1 mm ACA aneurysm Noted on CTA head and neck 1/4. As per radiology recommendation, follow-up CTA head in 1 to 2 years to ensure stability  Nutrition Status: Nutrition Problem: Severe Malnutrition Etiology: acute illness Signs/Symptoms: energy intake <  or equal to 50% for > or equal to 5 days, severe muscle depletion, severe  fat depletion Interventions: Refer to RD note for recommendations Agree with registered dietitian evaluation and management, continue management as per their recommendations.   Body mass index is 23.63 kg/m.   DVT prophylaxis: enoxaparin  (LOVENOX ) injection 40 mg Start: 10/29/24 1000     Code Status: Limited: Do not attempt resuscitation (DNR) -DNR-LIMITED -Do Not Intubate/DNI :  Family Communication: None at bedside.  Plan to discuss with family later. Disposition:  Status is: Inpatient Remains inpatient appropriate because: Chest tube, IV meds and further evaluation as above.     Consultants:   PCCM Neurology Vascular surgery  Procedures:   As above  Subjective:  Seen this morning prior to procedure.  Reports mild intermittent left-sided chest pain, nonspecific.  Denies dyspnea.  Some mild intermittent nonproductive cough.  Denies recurrence of strokelike symptoms.  Rest of history as noted above.  Objective:   Vitals:   11/02/24 0320 11/02/24 0848 11/02/24 0852 11/02/24 0924  BP: (!) 152/64   (!) 171/68  Pulse: 72  82 (!) 103  Resp: 16  16 20   Temp: (!) 97.4 F (36.3 C)     TempSrc:      SpO2: 98% 94% 99% 91%  Weight:      Height:        General exam: Elderly female, small built and frail, chronically ill looking lying comfortably propped up in bed without distress.  On 2 L/min [place] oxygen. Respiratory system: Diminished breath sounds in the right base but otherwise right lung fields clear to auscultation.  Decreased breath sounds in the left lungs, mostly in the mid and lower lobe lung fields. Cardiovascular system: S1 & S2 heard, RRR. No JVD, murmurs, rubs, gallops or clicks. No pedal edema.  Telemetry personally reviewed: Sinus rhythm. Gastrointestinal system: Abdomen is nondistended, soft and nontender. No organomegaly or masses felt. Normal bowel sounds heard. Central nervous system: Alert and oriented. No focal neurological deficits. Extremities: Symmetric 5 x  5 power. Skin: No rashes, lesions or ulcers Psychiatry: Judgement and insight appear normal. Mood & affect appropriate.     Data Reviewed:   I have personally reviewed following labs and imaging studies   CBC: Recent Labs  Lab 10/28/24 2331 10/29/24 0650 11/01/24 0446 11/02/24 0616  WBC 12.7* 10.1 11.5* 10.1  NEUTROABS 10.6* 7.2  --   --   HGB 9.0* 7.2* 7.4* 7.4*  HCT 31.4* 24.2* 25.4* 24.9*  MCV 75.5* 74.9* 74.5* 72.8*  PLT 633* 516* 641* 585*    Basic Metabolic Panel: Recent Labs  Lab 10/28/24 2331 10/29/24 0650 11/01/24 0446 11/02/24 0616  NA 137 135 139 138  K 3.7 3.4* 3.5 3.4*  CL 98 98 100 99  CO2 29 27 32 29  GLUCOSE 124* 101* 160* 79  BUN 14 13 22  26*  CREATININE 0.93 0.83 0.71 0.71  CALCIUM  9.2 8.4* 8.1* 8.3*  MG  --  1.5*  --  1.3*  PHOS  --  3.1  --   --     Liver Function Tests: Recent Labs  Lab 10/29/24 0650 11/02/24 0616  AST 28 75*  ALT 10 82*  ALKPHOS 118 115  BILITOT 0.2 <0.2  PROT 6.1* 5.6*  ALBUMIN 3.1* 2.9*    CBG: No results for input(s): GLUCAP in the last 168 hours.  Microbiology Studies:   Recent Results (from the past 240 hours)  Resp panel by RT-PCR (RSV, Flu A&B, Covid)  Anterior Nasal Swab     Status: None   Collection Time: 10/29/24 12:04 AM   Specimen: Anterior Nasal Swab  Result Value Ref Range Status   SARS Coronavirus 2 by RT PCR NEGATIVE NEGATIVE Final    Comment: (NOTE) SARS-CoV-2 target nucleic acids are NOT DETECTED.  The SARS-CoV-2 RNA is generally detectable in upper respiratory specimens during the acute phase of infection. The lowest concentration of SARS-CoV-2 viral copies this assay can detect is 138 copies/mL. A negative result does not preclude SARS-Cov-2 infection and should not be used as the sole basis for treatment or other patient management decisions. A negative result may occur with  improper specimen collection/handling, submission of specimen other than nasopharyngeal swab, presence of  viral mutation(s) within the areas targeted by this assay, and inadequate number of viral copies(<138 copies/mL). A negative result must be combined with clinical observations, patient history, and epidemiological information. The expected result is Negative.  Fact Sheet for Patients:  bloggercourse.com  Fact Sheet for Healthcare Providers:  seriousbroker.it  This test is no t yet approved or cleared by the [place]  FDA and  has been authorized for detection and/or diagnosis of SARS-CoV-2 by FDA under an Emergency Use Authorization (EUA). This EUA will remain  in effect (meaning this test can be used) for the duration of the COVID-19 declaration under Section 564(b)(1) of the Act, 21 U.S.C.section 360bbb-3(b)(1), unless the authorization is terminated  or revoked sooner.       Influenza A by PCR NEGATIVE NEGATIVE Final   Influenza B by PCR NEGATIVE NEGATIVE Final    Comment: (NOTE) The Xpert Xpress SARS-CoV-2/FLU/RSV plus assay is intended as an aid in the diagnosis of influenza from Nasopharyngeal swab specimens and should not be used as a sole basis for treatment. Nasal washings and aspirates are unacceptable for Xpert Xpress SARS-CoV-2/FLU/RSV testing.  Fact Sheet for Patients: bloggercourse.com  Fact Sheet for Healthcare Providers: seriousbroker.it  This test is not yet approved or cleared by the [place]  FDA and has been authorized for detection and/or diagnosis of SARS-CoV-2 by FDA under an Emergency Use Authorization (EUA). This EUA will remain in effect (meaning this test can be used) for the duration of the COVID-19 declaration under Section 564(b)(1) of the Act, 21 U.S.C. section 360bbb-3(b)(1), unless the authorization is terminated or revoked.     Resp Syncytial Virus by PCR NEGATIVE NEGATIVE Final    Comment: (NOTE) Fact Sheet for  Patients: bloggercourse.com  Fact Sheet for Healthcare Providers: seriousbroker.it  This test is not yet approved or cleared by the [place]  FDA and has been authorized for detection and/or diagnosis of SARS-CoV-2 by FDA under an Emergency Use Authorization (EUA). This EUA will remain in effect (meaning this test can be used) for the duration of the COVID-19 declaration under Section 564(b)(1) of the Act, 21 U.S.C. section 360bbb-3(b)(1), unless the authorization is terminated or revoked.  Performed at [facility], [address], [place], [place] [address]   Culture, blood (Routine X 2) w Reflex to ID Panel     Status: None (Preliminary result)   Collection Time: 10/29/24  6:50 AM   Specimen: BLOOD  Result Value Ref Range Status   Specimen Description BLOOD LEFT ANTECUBITAL  Final   Special Requests   Final    BOTTLES DRAWN AEROBIC AND ANAEROBIC Blood Culture adequate volume   Culture   Final    NO GROWTH 4 DAYS Performed at [facility], [address] [place], [place] [address]  Report Status PENDING  Incomplete  Culture, blood (Routine X 2) w Reflex to ID Panel     Status: None (Preliminary result)   Collection Time: 10/29/24  6:52 AM   Specimen: BLOOD LEFT ARM  Result Value Ref Range Status   Specimen Description BLOOD LEFT ARM BLOOD  Final   Special Requests   Final    BOTTLES DRAWN AEROBIC AND ANAEROBIC Blood Culture adequate volume   Culture   Final    NO GROWTH 4 DAYS Performed at [facility], [address], [place], [place] [address]    Report Status PENDING  Incomplete  Expectorated Sputum Assessment w Gram Stain, Rflx to Resp Cult     Status: None   Collection Time: 10/29/24 12:50 PM   Specimen: Sputum  Result Value Ref Range Status   Specimen Description SPU  Final   Special Requests NONE  Final   Sputum evaluation   Final    THIS SPECIMEN IS ACCEPTABLE FOR SPUTUM CULTURE Performed at [facility], [address], [place], [place] [address]    Report Status 10/29/2024 FINAL  Final  Culture, Respiratory w Gram Stain     Status: None   Collection Time: 10/29/24 12:50 PM   Specimen: Sputum  Result Value Ref Range Status   Specimen Description   Final    SPU Performed at [facility], [address] [place], [place] [address]    Special Requests   Final    NONE Reflexed from [id number] Performed at [facility], [address] [place], [place] [address]    Gram Stain   Final    NO WBC SEEN FEW GRAM POSITIVE COCCI RARE GRAM NEGATIVE RODS    Culture   Final    FEW Normal respiratory flora-no Staph aureus or Pseudomonas seen Performed at [facility], [address] [place], [place] [address]    Report Status 10/31/2024 FINAL  Final  Gram stain     Status: None   Collection Time: 10/30/24  9:45 AM   Specimen: Pleura  Result Value Ref Range Status   Specimen Description PLEURAL  Final   Special Requests NONE  Final   Gram Stain   Final    NO ORGANISMS SEEN WBC PRESENT,BOTH PMN AND MONONUCLEAR CYTOSPIN SMEAR Performed at [facility], [address], [place], [place] [address]    Report Status 10/30/2024 FINAL  Final  Culture, body fluid w Gram Stain-bottle     Status: None (Preliminary result)   Collection Time: 10/30/24  9:45 AM   Specimen: Pleura  Result Value Ref Range Status   Specimen Description PLEURAL BOTTLES DRAWN AEROBIC AND ANAEROBIC  Final   Special Requests 10CC  Final   Culture   Final    NO GROWTH 3 DAYS Performed at [facility], [address] [place], [place] [address]    Report Status PENDING  Incomplete    Radiology Studies:  No results found.  Scheduled Meds:    alteplase  (CATHFLO ACTIVASE ) 10 mg in sodium chloride  (PF) 0.9 % 30 mL  10 mg Intrapleural Once   And   dornase alfa  (PULMOZYME ) 5 mg in sterile water  (preservative free) 30 mL  5 mg Intrapleural Once   aspirin  EC  81 mg Oral Daily   budesonide  (PULMICORT ) nebulizer solution   0.5 mg Nebulization BID   clopidogrel   75 mg Oral Daily   dextromethorphan -guaiFENesin   1 tablet Oral BID   enoxaparin  (LOVENOX ) injection  40 mg Subcutaneous Q24H  feeding supplement  237 mL Oral BID BM   ipratropium-albuterol   3 mL Nebulization BID   methylPREDNISolone  (SOLU-MEDROL ) injection  40 mg Intravenous Daily   pantoprazole   40 mg Oral BID   rosuvastatin   20 mg Oral Daily   sodium chloride  flush  10 mL Intrapleural Q8H    Continuous Infusions:    cefTRIAXone  (ROCEPHIN )  IV 2 g (11/01/24 2115)     LOS: 4 days     Trenda Mar, MD,  FACP, [facility], [facility], [place]   [facility] Ad[facility] [place]   To contact the attending provider between 7A-7P or the covering provider during after hours 7P-7A, please log into the web site [email or web address] and access using universal [place] password for that web site. If you do not have the password, please call the hospital operator.  11/02/2024, 10:36 AM   "

## 2024-11-02 NOTE — Progress Notes (Signed)
 Patient complaining about tongue and mouth feeling irritated. Patient's tongue has some redness and looks irritated, but no signs of thrush. Notified Dr. Trenda Mar via secure chat about patient's concerns and asked if patient can have magic mouthwash. MD put new orders in for magic mouthwash prn.

## 2024-11-02 NOTE — Progress Notes (Signed)
 Physical Therapy Treatment Patient Details Name: Brandi Mcmahon MRN: [id number] DOB: [birth year] Today's Date: 11/02/2024   History of Present Illness Brandi Mcmahon is a 84 y.o. 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       This evening, as her daughter-in-law was preparing to bring her to the ER the patient was noted to be aphasic with facial droop and slurred speech witnessed by the patient's son and daughter-in-law.  EMS was activated.  This episode lasted about 10 minutes then resolved spontaneously.    PT Comments  Received pt semi-reclined in bed with PA at bedside. Pt initially not agreeable to OOB mobility due to pain, wanting to eat, and new chest tube - with encouragement, pt agreed. Pt transferred semi-reclined<>sitting L EOB with HOB elevated and use of bedrails with supervision and ++ time and performed stand<>pivot into recliner with min HHA. Set pt up to eat and plan to visit with family arriving shortly. Pt would be good candidate for HHPT services as she is able to progress with mobility. Acute PT to cont to follow.     If plan is discharge home, recommend the following: A little help with walking and/or transfers;A little help with bathing/dressing/bathroom;Help with stairs or ramp for entrance;Assist for transportation;Assistance with cooking/housework   Can travel by private [employer] (2 wheels)    Recommendations for Other Services       Precautions / Restrictions Precautions Precautions:  Fall Recall of Precautions/Restrictions: Intact Precaution/Restrictions Comments: chest tube Restrictions Weight Bearing Restrictions Per Provider Order: No     Mobility  Bed Mobility Overal bed mobility: Needs Assistance Bed Mobility: Rolling, Supine to Sit Rolling: Supervision   Supine to sit: Supervision, HOB elevated, Used rails     General bed mobility comments: ++ time due to chest tube Patient Response: Cooperative  Transfers Overall transfer level: Needs assistance Equipment used: 1 person hand held assist Transfers: Sit to/from Stand, Bed to chair/wheelchair/BSC Sit to Stand: Min assist Stand pivot transfers: Min assist         General transfer comment: performed stand<>pivot to recliner with min HHA    Ambulation/Gait               General Gait Details: unable due to new chest tube   Stairs             Wheelchair Mobility     Tilt Bed Tilt Bed Patient Response: Cooperative  Modified Rankin (Stroke Patients Only)       Balance Overall balance assessment: Needs assistance Sitting-balance support: Feet supported, No upper extremity supported Sitting balance-Leahy Scale: Good     Standing balance support: Single extremity supported, During functional activity Standing balance-Leahy Scale: Fair Standing balance comment: able to maintain static standing balance with close supervision but required min HHA for dyanmic standing balance                            Communication Communication Communication: No apparent difficulties Factors Affecting Communication: Hearing impaired  Cognition Arousal: Alert Behavior During Therapy: [facility]  for tasks assessed/performed                             Following commands: Intact      Cueing Cueing Techniques: Verbal cues, Tactile cues  Exercises      General Comments        Pertinent Vitals/Pain Pain Assessment Faces Pain Scale: Hurts a little bit Pain Location:  low back pain and discomfort from chest tube Pain Descriptors / Indicators: Sore Pain Intervention(s): Limited activity within patient's tolerance, Monitored during session, Repositioned    Home Living                          Prior Function            PT Goals (current goals can now be found in the care plan section) Acute Rehab PT Goals Patient Stated Goal: return home with family to assist PT Goal Formulation: With patient Time For Goal Achievement: 11/03/24 Potential to Achieve Goals: Good Progress towards PT goals: Progressing toward goals    Frequency    Min 3X/week      PT Plan      Co-evaluation              AM-PAC PT 6 Clicks Mobility   Outcome Measure  Help needed turning from your back to your side while in a flat bed without using bedrails?: A Little Help needed moving from lying on your back to sitting on the side of a flat bed without using bedrails?: A Little Help needed moving to and from a bed to a chair (including a wheelchair)?: A Little Help needed standing up from a chair using your arms (e.g., wheelchair or bedside chair)?: A Little Help needed to walk in hospital room?: A Lot Help needed climbing 3-5 steps with a railing? : A Lot 6 Click Score: 16    End of Session Equipment Utilized During Treatment: Oxygen Activity Tolerance: Patient tolerated treatment well Patient left: in chair;with call bell/phone within reach;with chair alarm set Nurse Communication: Mobility status PT Visit Diagnosis: Unsteadiness on feet (R26.81);Other abnormalities of gait and mobility (R26.89);Muscle weakness (generalized) (M62.81)     Time: 8897-8884 PT Time Calculation (min) (ACUTE ONLY): 13 min  Charges:    $Therapeutic Activity: 8-22 mins PT General Charges $$ ACUTE PT VISIT: 1 Visit                     Therisa Stains PT, DPT Therisa HERO Zaunegger 11/02/2024, 11:58 AM

## 2024-11-02 NOTE — Procedures (Signed)
 Insertion of Chest Tube Procedure Note  Brandi Mcmahon  [id number]  01-15-1941  Date:11/02/2024  Time:10:41 AM    Provider Performing: Zola SAILOR [name]   Procedure: Pleural Catheter Insertion w/ Imaging Guidance ([id number])  Indication(s) Effusion  Consent Risks of the procedure as well as the alternatives and risks of each were explained to the patient and/or caregiver.  Consent for the procedure was obtained and is signed in the bedside chart  Anesthesia Topical only with 1% lidocaine     Time Out Verified patient identification, verified procedure, site/side was marked, verified correct patient position, special equipment/implants available, medications/allergies/relevant history reviewed, required imaging and test results available.   Sterile Technique Maximal sterile technique including full sterile barrier drape, hand hygiene, sterile gown, sterile gloves, mask, hair covering, sterile ultrasound probe cover (if used).   Procedure Description Ultrasound used to identify appropriate pleural anatomy for placement and overlying skin marked. Area of placement cleaned and draped in sterile fashion.  A 14 French pigtail pleural catheter was placed into the left pleural space using Seldinger technique. Appropriate return of fluid was obtained.  The tube was connected to atrium and placed on -20 cm H2O wall suction.   Complications/Tolerance None; patient tolerated the procedure well. Chest X-ray is ordered to verify placement.   EBL Minimal  Specimen(s) Pleural fluid sample sent for cell count, microbiology and cytology

## 2024-11-02 NOTE — Progress Notes (Signed)
 Brief PCCM progress Note   First dose of pleural lytics were instilled within left small bore chest tube.On first attempted to flush tube with saline the tube did not appear to be functional as I was unable to flush catheter. After manipulation with slightly gentle pressure added I was able to both flush tube and instill the pleural lytics.   Annabel Gibeau D. Harris, NP-C [facility] Personal contact information can be found on Amion  If no contact or response made please call [phone] 11/02/2024, 2:27 PM

## 2024-11-02 NOTE — Progress Notes (Signed)
 Patient request all four rails of the bed to be up because she feels safer.

## 2024-11-02 NOTE — Consult Note (Addendum)
 " Hospital Consult    Reason for Consult:  carotid artery stenosis Requesting Physician:  Dr. Judeth  MRN #:  [id number]  History of Present Illness: This is a 84 y.o. female with past medical history significant for CAD, PAD w remote hx of aorto to celiac and SMA bypass, HTN, HLD, GERD, tobacco abuse, Anemia, and Chronic Respiratory failure with hypoxia on 2L [place] home O2 who presented to [facility] with complaints of 1 week of cough and chest congestion. She is known to our vascular service from prior right femoral endarterectomy with profundoplasty and bovine patch as well as bilateral common iliac artery stenting by Dr. Gretta in April of 2023.   Presently she does not recall having any difficulty talking or facial droop prior to going to ED, but she was with her son and daughter in law who reported episode of aphasia and facial drooping. Per patient her daughter in law is an EMT so she is aware of stroke symptoms. Patient says she recalls having a lot of anxiety and felt she was having an anxiety attack and but was unable to talk. She says she has these  attacks often but has never had trouble talking before. She was told this lasted about 10 minutes. She has not had an event like this before. She presently feels back to her baseline. She denies any visual changes or upper or lower extremity weakness or numbness. MRI was negative for acute stroke. 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 She was already managed on statin and Plavix  for her prior iliac stents and CAD. She has been restarted on Aspirin  as well per Neurology recommendations. Vascular surgery was consulted for evaluation of her left ICA stenosis.   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   GERD (gastroesophageal reflux disease)    Hyperlipidemia    Hypertension     Shingles     Past Surgical History:  Procedure Laterality Date   AORTOGRAM N/A 02/09/2022   Procedure: AORTOGRAM WITH ILIAC ARTERIOGRAM;  Surgeon: Gretta Lonni PARAS, MD;  Location: [facility] OR;  Service: Vascular;  Laterality: N/A;   BREAST LUMPECTOMY Left 11/07/2009   for left DCIS   BUNIONECTOMY  2009   CARDIAC CATHETERIZATION     ~ 2004, reported results unremarkable and shoulder symptoms due to bursitis   COLONOSCOPY  2019   COLONOSCOPY WITH PROPOFOL  N/A 10/23/2020   Procedure: COLONOSCOPY WITH PROPOFOL ;  Surgeon: Wilhelmenia Aloha Raddle., MD;  Location: [facility];  Service: Gastroenterology;  Laterality: N/A;   ENDARTERECTOMY FEMORAL Right 02/09/2022   Procedure: RIGHT COMMON FEMORAL ENDARTERECTOMY WITH PROFUNDA PLASTY AND SUPERFICIAL FEMORAL ARTERY ENDARTERECTOMY;  Surgeon: Gretta Lonni PARAS, MD;  Location: [facility] OR;  Service: Vascular;  Laterality: Right;   HEMOSTASIS CLIP PLACEMENT  10/23/2020   Procedure: HEMOSTASIS CLIP PLACEMENT;  Surgeon: Wilhelmenia Aloha Raddle., MD;  Location: [facility];  Service: Gastroenterology;;   INSERTION OF ILIAC STENT Bilateral 02/09/2022   Procedure: BILATERAL COMMON ILIAC ANGIOPLASTY WITH STENTS;  Surgeon: Gretta Lonni PARAS, MD;  Location: [facility] OR;  Service: Vascular;  Laterality: Bilateral;   INTRAOPERATIVE ARTERIOGRAM Right 02/09/2022   Procedure: RIGHT LOWER EXTREMITY ARTERIOGRAM;  Surgeon: Gretta Lonni PARAS, MD;  Location: [facility] OR;  Service: Vascular;  Laterality: Right;   LUMBAR LAMINECTOMY/DECOMPRESSION MICRODISCECTOMY Right 06/18/2021  Procedure: Laminectomy and Foraminotomy - right - Lumbar four-Lumbar five for resection of synovial cyst;  Surgeon: Onetha Kuba, MD;  Location: [facility] OR;  Service: Neurosurgery;  Laterality: Right;   PATCH ANGIOPLASTY Right 02/09/2022   Procedure: PATCH ANGIOPLASTY USING XENOSURE 1cm x 14cm BIOLOGIC PATCH;  Surgeon: Gretta Lonni PARAS, MD;  Location: [facility] OR;  Service: Vascular;  Laterality: Right;    POLYPECTOMY  10/23/2020   Procedure: POLYPECTOMY;  Surgeon: Wilhelmenia Aloha Raddle., MD;  Location: [facility];  Service: Gastroenterology;;   SUBMUCOSAL LIFTING INJECTION  10/23/2020   Procedure: SUBMUCOSAL LIFTING INJECTION;  Surgeon: Wilhelmenia Aloha Raddle., MD;  Location: [facility];  Service: Gastroenterology;;   SUBMUCOSAL TATTOO INJECTION  10/23/2020   Procedure: SUBMUCOSAL TATTOO INJECTION;  Surgeon: Wilhelmenia Aloha Raddle., MD;  Location: [facility];  Service: Gastroenterology;;   TOTAL ABDOMINAL HYSTERECTOMY  1987   ULTRASOUND GUIDANCE FOR VASCULAR ACCESS Bilateral 02/09/2022   Procedure: ULTRASOUND GUIDANCE FOR VASCULAR ACCESS LEFT COMMON FEMORAL ARTERY;  Surgeon: Gretta Lonni PARAS, MD;  Location: [facility] OR;  Service: Vascular;  Laterality: Bilateral;    Allergies[1]  Prior to Admission medications  Medication Sig Start Date End Date Taking? Authorizing Provider  albuterol  (VENTOLIN  HFA) 108 (90 Base) MCG/ACT inhaler Inhale 1-2 puffs into the lungs 4 (four) times daily as needed for shortness of breath or wheezing. 05/07/20  Yes [provider]  Cholecalciferol  (VITAMIN D -1000 MAX ST) 25 MCG (1000 UT) tablet Take 25 mcg by mouth.   Yes [provider]  clopidogrel  (PLAVIX ) 75 MG tablet Take 75 mg by mouth daily.   Yes [provider]  diazepam  (VALIUM ) 5 MG tablet Take 5 mg by mouth 3 (three) times daily as needed for anxiety. 02/27/20  Yes [provider]  lisinopril  (ZESTRIL ) 20 MG tablet Take 20 mg by mouth daily. 03/10/20  Yes [provider]  MYRBETRIQ  50 MG TB24 tablet Take 50 mg by mouth daily. 05/21/17  Yes [provider]  pantoprazole  (PROTONIX ) 40 MG tablet Take 1 tablet (40 mg total) by mouth daily. 06/01/24  Yes Beather Delon Gibson, PA  polyethylene glycol powder (GLYCOLAX /MIRALAX ) 17 GM/SCOOP powder Take by mouth once.   Yes [provider]  rosuvastatin  (CRESTOR ) 20 MG tablet Take 20 mg by mouth daily.  04/03/22  Yes [provider]  acetaminophen  (TYLENOL ) 500 MG tablet Take 500-1,000 mg by mouth every 6 (six) hours as needed for moderate pain. Patient not taking: No sig reported    [provider]  aspirin  EC 81 MG EC tablet Take 1 tablet (81 mg total) by mouth daily. Swallow whole. Patient not taking: Reported on 10/30/2024 02/12/22   Baglia, Corrina, PA-C  Cetirizine HCl (ZYRTEC ALLERGY) 10 MG CAPS Take 10 mg by mouth daily. Patient not taking: Reported on 10/30/2024 05/21/17   [provider]  diphenhydramine -acetaminophen  (TYLENOL  PM) 25-500 MG TABS tablet Take 1-2 tablets by mouth at bedtime as needed (sleep / pain). Patient not taking: Reported on 10/30/2024    [provider]  docusate sodium  (COLACE) 100 MG capsule Take 100-200 mg by mouth at bedtime as needed for mild constipation. Patient not taking: Reported on 10/30/2024    [provider]  escitalopram (LEXAPRO) 5 MG tablet Take 5 mg by mouth daily. Patient not taking: Reported on 10/30/2024 09/08/24   [provider]  gabapentin  (NEURONTIN ) 100 MG capsule Take 100 mg by mouth 2 (two) times daily. Patient not taking: Reported on 10/30/2024 08/01/21   [provider]  melatonin 3  MG TABS tablet Take 3-6 mg by mouth at bedtime as needed (sleep). Patient not taking: Reported on 10/30/2024    [provider]  mometasone (NASONEX) 50 MCG/ACT nasal spray Place 2 sprays into the nose daily as needed (allergies). Patient not taking: Reported on 10/30/2024 01/18/20   [provider]  montelukast  (SINGULAIR ) 10 MG tablet Take 10 mg by mouth at bedtime. Patient not taking: Reported on 10/29/2024 03/11/20   [provider]  Multiple Vitamin (MULTI-VITAMIN) tablet Take 1 tablet by mouth daily. Patient not taking: Reported on 10/30/2024    [provider]  Omega-3 Fatty Acids (FISH OIL  ULTRA) 1400 MG CAPS Take 1,400 mg by mouth daily. Patient not taking: Reported on  10/30/2024    [provider]  polycarbophil (FIBERCON) 625 MG tablet Take 625 mg by mouth in the morning and at bedtime. Patient not taking: No sig reported    [provider]  sucralfate  (CARAFATE ) 1 g tablet Take 1 tablet (1 g total) by mouth 2 (two) times daily. Try Pantoprazole  for 1 week before starting Carafate  Patient not taking: Reported on 10/30/2024 02/10/23   Mansouraty, Aloha Raddle., MD    Social History   Socioeconomic History   Marital status: Married    Spouse name: Not on file   Number of children: Not on file   Years of education: Not on file   Highest education level: Not on file  Occupational History   Not on file  Tobacco Use   Smoking status: Former    Current packs/day: 0.25    Types: Cigarettes   Smokeless tobacco: Never   Tobacco comments:    1-2 per day, trying to quit  Vaping Use   Vaping status: Never Used  Substance and Sexual Activity   Alcohol  use: Not Currently   Drug use: Never   Sexual activity: Not on file  Other Topics Concern   Not on file  Social History Narrative   Not on file   Social Drivers of Health   Tobacco Use: Medium Risk (10/30/2024)   Patient History    Smoking Tobacco Use: Former    Smokeless Tobacco Use: Never    Passive Exposure: Not on file  Financial Resource Strain: Low Risk (10/12/2024)   Received from [facility]   Overall Financial Resource Strain (CARDIA)    How hard is it for you to pay for the very basics like food, housing, medical care, and heating?: Not hard at all  Food Insecurity: No Food Insecurity (11/01/2024)   Epic    Worried About Radiation Protection Practitioner of Food in the Last Year: Never true    Ran Out of Food in the Last Year: Never true  Transportation Needs: No Transportation Needs (11/01/2024)   Epic    Lack of Transportation (Medical): No    Lack of Transportation (Non-Medical): No  Physical Activity: Inactive (10/12/2024)   Received from [facility]   Exercise Vital Sign    On  average, how many days per week do you engage in moderate to strenuous exercise (like a brisk walk)?: 0 days    On average, how many minutes do you engage in exercise at this level?: 0 min  Stress: No Stress Concern Present (10/12/2024)   Received from [facility] - Occupational Stress Questionnaire    Do you feel stress - tense, restless, nervous, or anxious, or unable to sleep at night because your mind is troubled all  the time - these days?: Not at all  Social Connections: Moderately Isolated (11/01/2024)   Social Connection and Isolation Panel    Frequency of Communication with Friends and Family: More than three times a week    Frequency of Social Gatherings with Friends and Family: More than three times a week    Attends Religious Services: Patient declined    Database Administrator or Organizations: Yes    Attends Banker Meetings: 1 to 4 times per year    Marital Status: Widowed  Intimate Partner Violence: Not At Risk (11/01/2024)   Epic    Fear of Current or Ex-Partner: No    Emotionally Abused: No    Physically Abused: No    Sexually Abused: No  Depression (PHQ2-9): Not on file  Alcohol  Screen: Not on file  Housing: Low Risk (11/01/2024)   Epic    Unable to Pay for Housing in the Last Year: No    Number of Times Moved in the Last Year: 0    Homeless in the Last Year: No  Utilities: Not At Risk (10/29/2024)   Epic    Threatened with loss of utilities: No  Health Literacy: Low Risk (10/12/2024)   Received from [facility]    How often do you need to have someone help you when you read instructions, pamphlets, or other written material from your doctor or pharmacy?: Never     Family History  Problem Relation Age of Onset   Breast cancer Mother    Lung cancer Father    Colon cancer Neg Hx    Colon polyps Neg Hx    Esophageal cancer Neg Hx    Rectal cancer Neg Hx    Stomach cancer Neg Hx     Inflammatory bowel disease Neg Hx    Liver disease Neg Hx    Pancreatic cancer Neg Hx     ROS: Otherwise negative unless mentioned in HPI  Physical Examination  Vitals:   11/02/24 0924 11/02/24 1005  BP: (!) 171/68 (!) 155/71  Pulse: (!) 103 (!) 103  Resp: 20 19  Temp:    SpO2: 91% 95%   Body mass index is 23.63 kg/m.  General:  WDWN in NAD Gait: Not observed HENT: WNL, normocephalic Pulmonary: normal non-labored breathing, without wheezing on 2L [place] Cardiac: regular Abdomen: soft Vascular Exam/Pulses: moving all extremities without deficits Musculoskeletal: no muscle wasting or atrophy  Neurologic: A&O X 3;  No focal weakness or paresthesias are detected; speech is fluent/normal Psychiatric:  The pt has Normal affect. Lymph:  Unremarkable  CBC    Component Value Date/Time   WBC 10.1 11/02/2024 0616   RBC 3.42 (L) 11/02/2024 0616   RBC 3.41 (L) 11/02/2024 0616   HGB 7.4 (L) 11/02/2024 0616   HCT 24.9 (L) 11/02/2024 0616   PLT 585 (H) 11/02/2024 0616   MCV 72.8 (L) 11/02/2024 0616   MCH 21.6 (L) 11/02/2024 0616   MCHC 29.7 (L) 11/02/2024 0616   RDW 20.7 (H) 11/02/2024 0616   LYMPHSABS 2.0 10/29/2024 0650   MONOABS 0.8 10/29/2024 0650   EOSABS 0.0 10/29/2024 0650   BASOSABS 0.0 10/29/2024 0650    BMET    Component Value Date/Time   NA 138 11/02/2024 0616   K 3.4 (L) 11/02/2024 0616   CL 99 11/02/2024 0616   CO2 29 11/02/2024 0616   GLUCOSE 79 11/02/2024 0616   BUN 26 (H) 11/02/2024 0616   BUN 13 12/29/2021  1145   CREATININE 0.71 11/02/2024 0616   CALCIUM  8.3 (L) 11/02/2024 0616   GFRNONAA >60 11/02/2024 0616    COAGS: Lab Results  Component Value Date   INR 1.1 10/28/2024   INR 1.1 02/09/2022     Non-Invasive Vascular Imaging:   CT Angio head neck w wo CM: IMPRESSION: 1. Critical stenosis (>90%) of the left internal carotid artery origin due to extensive calcific plaque; correlate clinically and consider urgent vascular surgery/neurovascular  consultation. 2. Less than 30% stenosis of the proximal right internal carotid artery and less than 20% stenosis of the carotid siphons bilaterally due to calcific plaque. 3. Ulcerative plaque and moderate calcific plaque in the distal aortic arch, which can be a potential embolic source. 4. Fetal type origin of the right posterior cerebral artery with a diminutive right P1 segment. 5. Moderate centrilobular emphysema and coarse left upper lobe consolidation.  Statin:  Yes.   Beta Blocker:  No. Aspirin :  Yes.   ACEI:  Yes.   ARB:  No. CCB use:  No Other antiplatelets/anticoagulants:  Yes.   Plavix    ASSESSMENT/PLAN: This is a 84 y.o. female past medical history significant for CAD, PAD w remote hx of aorto to celiac and SMA bypass, HTN, HLD, GERD, tobacco abuse, Anemia, and Chronic Respiratory failure with hypoxia on 2L [place] home O2 who presented to [facility] with complaints of 1 week of cough and chest congestion. She has been diagnosed with pneumonia/ COPD exacerbation. She was also noted to have an episode of aphasia and facial drooping prior to arrival per her family. She is now back to her baseline. No other neurological symptoms. MRI negative for acute stroke. CT Angio head and neck showing high grade left ICA stenosis of 90%. Likely had a TIA event related to her carotid stenosis. She is currently medically managed on Aspirin , Statin and Plavix . I discussed with her options for medical management vs Carotid endarterectomy vs Transcarotid artery revascularization(TCAR). I discussed that the carotid interventions would be to reduce her stroke risk in the future. Our recommendation would be for TCAR once she has recovered from her respiratory acute illness. Presently she is not interested in pursuing any surgery. She expresses that she is very overwhelmed with everything going on with her. I discussed continuing medical management presently and giving her some time to think about it as  well as talk it over with her family. The on call vascular surgeon, Dr. Magda, will see her later today to discuss further management plans.   Teretha Damme PA-C [facility] [phone] 11/02/2024  12:23 PM  VASCULAR STAFF ADDENDUM: I have independently interviewed and examined the patient. I agree with the above.  Patient not willing to consider intervention for symptomatic carotid intervention at this time. She feels overwhelmed by the situation. I counseled her that we would continue to discuss during her admission. We reviewed risks / benefits / alternatives. Risks of intervention are much lower than risk of untreated symptomatic stenosis. Will check in tomorrow.  Debby SAILOR. Magda, MD FACS [facility] Phone Number: [phone] 11/02/2024 4:38 PM        [1]  Allergies Allergen Reactions   Augmentin [Amoxicillin-Pot Clavulanate] Other (See Comments)    ABDONIMAL PAIN    Clavulanic Acid Hives   Codeine Hives and Other (See Comments)   Alprazolam Other (See Comments)    OVER SEDATES    Bee Venom Hives   Sulfa Antibiotics Rash and  Other (See Comments)   Tramadol      Insomnia    Avelox [Moxifloxacin] Rash   Hydrocodone-Acetaminophen  Other (See Comments)    Insomnia    "

## 2024-11-03 ENCOUNTER — Inpatient Hospital Stay (HOSPITAL_COMMUNITY)

## 2024-11-03 DIAGNOSIS — J948 Other specified pleural conditions: Secondary | ICD-10-CM | POA: Diagnosis not present

## 2024-11-03 DIAGNOSIS — J939 Pneumothorax, unspecified: Secondary | ICD-10-CM | POA: Diagnosis not present

## 2024-11-03 DIAGNOSIS — J90 Pleural effusion, not elsewhere classified: Secondary | ICD-10-CM | POA: Diagnosis not present

## 2024-11-03 DIAGNOSIS — J9621 Acute and chronic respiratory failure with hypoxia: Secondary | ICD-10-CM | POA: Diagnosis not present

## 2024-11-03 DIAGNOSIS — J189 Pneumonia, unspecified organism: Secondary | ICD-10-CM | POA: Diagnosis not present

## 2024-11-03 DIAGNOSIS — G459 Transient cerebral ischemic attack, unspecified: Secondary | ICD-10-CM | POA: Diagnosis not present

## 2024-11-03 LAB — LD, BODY FLUID (OTHER)
LD, Body Fluid: 594 IU/L
Source of Sample: 100156

## 2024-11-03 LAB — CBC
HCT: 24.9 % — ABNORMAL LOW (ref 36.0–46.0)
Hemoglobin: 7.5 g/dL — ABNORMAL LOW (ref 12.0–15.0)
MCH: 21.9 pg — ABNORMAL LOW (ref 26.0–34.0)
MCHC: 30.1 g/dL (ref 30.0–36.0)
MCV: 72.8 fL — ABNORMAL LOW (ref 80.0–100.0)
Platelets: 569 K/uL — ABNORMAL HIGH (ref 150–400)
RBC: 3.42 MIL/uL — ABNORMAL LOW (ref 3.87–5.11)
RDW: 20.7 % — ABNORMAL HIGH (ref 11.5–15.5)
WBC: 12.2 K/uL — ABNORMAL HIGH (ref 4.0–10.5)
nRBC: 0.2 % (ref 0.0–0.2)

## 2024-11-03 LAB — COMPREHENSIVE METABOLIC PANEL WITH GFR
ALT: 64 U/L — ABNORMAL HIGH (ref 0–44)
AST: 42 U/L — ABNORMAL HIGH (ref 15–41)
Albumin: 2.8 g/dL — ABNORMAL LOW (ref 3.5–5.0)
Alkaline Phosphatase: 108 U/L (ref 38–126)
Anion gap: 10 (ref 5–15)
BUN: 24 mg/dL — ABNORMAL HIGH (ref 8–23)
CO2: 26 mmol/L (ref 22–32)
Calcium: 8 mg/dL — ABNORMAL LOW (ref 8.9–10.3)
Chloride: 98 mmol/L (ref 98–111)
Creatinine, Ser: 0.62 mg/dL (ref 0.44–1.00)
GFR, Estimated: >60 mL/min
Glucose, Bld: 99 mg/dL (ref 70–99)
Potassium: 3.9 mmol/L (ref 3.5–5.1)
Sodium: 134 mmol/L — ABNORMAL LOW (ref 135–145)
Total Bilirubin: <0.2 mg/dL (ref 0.0–1.2)
Total Protein: 5.5 g/dL — ABNORMAL LOW (ref 6.5–8.1)

## 2024-11-03 LAB — CULTURE, BLOOD (ROUTINE X 2)
Culture: NO GROWTH
Culture: NO GROWTH
Special Requests: ADEQUATE
Special Requests: ADEQUATE

## 2024-11-03 LAB — PROTEIN, BODY FLUID (OTHER)
Source of Sample: 19588
Total Protein, Body Fluid Other: 2.2 g/dL

## 2024-11-03 LAB — TRIGLYCERIDES, BODY FLUIDS
Fluid Type-FTRIG: 101170
Triglycerides, Fluid: 12 mg/dL

## 2024-11-03 LAB — GLUCOSE, BODY FLUID OTHER
Glucose, Body Fluid Other: 70 mg/dL
Source of Sample: 19497

## 2024-11-03 LAB — CYTOLOGY - NON PAP

## 2024-11-03 LAB — MAGNESIUM: Magnesium: 2.1 mg/dL (ref 1.7–2.4)

## 2024-11-03 MED ORDER — CLOPIDOGREL BISULFATE 75 MG PO TABS
75.0000 mg | ORAL_TABLET | Freq: Every day | ORAL | Status: DC
  Administered 2024-11-03 – 2024-11-07 (×5): 75 mg via ORAL
  Filled 2024-11-03 (×5): qty 1

## 2024-11-03 MED ORDER — SODIUM CHLORIDE 0.9% FLUSH: 10.0000 mL | INTRAVENOUS | Status: DC | PRN

## 2024-11-03 MED ORDER — FERROUS SULFATE 325 (65 FE) MG PO TABS
325.0000 mg | ORAL_TABLET | Freq: Every day | ORAL | Status: DC
  Administered 2024-11-03 – 2024-11-07 (×5): 325 mg via ORAL
  Filled 2024-11-03 (×5): qty 1

## 2024-11-03 MED ORDER — SODIUM CHLORIDE 0.9% FLUSH
10.0000 mL | Freq: Two times a day (BID) | INTRAVENOUS | Status: DC
  Administered 2024-11-04 – 2024-11-07 (×8): 10 mL

## 2024-11-03 NOTE — Progress Notes (Signed)
 "  NAME:  Brandi Mcmahon, MRN:  [id number], DOB:  [birth year], LOS: 5 ADMISSION DATE:  10/28/2024, CONSULTATION DATE:  10/31/2024 REFERRING MD:  TRH, CHIEF COMPLAINT: Loculated left pleural effusion  History of Present Illness:  Brandi Mcmahon is a 84 year old female with a past medical history significant for HTN, HLD, GERD, left breast cancer, and anxiety who initially presented to the ED at [facility] for reports of weakness.  Additionally she reported 1 week of productive cough with green sputum and congestion.  On ED arrival patient was seen mildly tachypneic, tachycardic, and slightly hypertensive.  Chest x-ray was obtained and revealed consolidation throughout the left lung concerning for pneumonia and possible left pleural effusion.  Additional imaging was obtained with CT chest which revealed a large loculated left pleural effusion with diffuse nodular pleural thickening concerning for metastatic pleural disease with superimposed pneumonia.  Patient was admitted per hospitalist for management of CAP   Pertinent  Medical History  HTN, HLD, GERD, left breast cancer, and anxiety   Significant Hospital Events: Including procedures, antibiotic start and stop dates in addition to other pertinent events   1/4 initially presented for weakness workup revealed significant left pneumonia consistent with CAP and large loculated pleural effusion 1/5 patient underwent thoracentesis per interventional radiology with only 125 mL of serosanguineous fluid removed due to highly loculated effusion 1/7 PCCM consulted for potential need of chest tube and pleural lytics, patient transferred from [facility]  Interim History / Subjective:  NAEON. Chest tube output ~1300 cc, serosanguinous.   Objective    Blood pressure (!) 143/61, pulse 89, temperature 97.7 F (36.5 C), temperature source Oral, resp. rate 18, height 5' (1.524 m), weight 52.9 kg, SpO2 95%.    FiO2 (%):  [28 %] 28 %   Intake/Output Summary (Last 24  hours) at 11/03/2024 1446 Last data filed at 11/03/2024 1137 Gross per 24 hour  Intake 545.06 ml  Output 170 ml  Net 375.06 ml   Filed Weights   10/29/24 0335 11/02/24 1700  Weight: 54.9 kg 52.9 kg    Examination: General: elderly and frail Pulmonary: Chest tube in place, normal work of breathing   Resolved problem list   Assessment and Plan  Acute on chronic hypoxic respiratory failure Large loculated left exudative parapneumonic pleural effusion  Community-acquired pneumonia -Continue abx -Chest tube s/p 1 dose lytics with good output -CXR with near evacuation of effusion and residual hydropneumothorax due to lack of lung re-expansion -Continue CT to suction, daily CXR while in place   Labs   CBC: Recent Labs  Lab 10/28/24 2331 10/29/24 0650 11/01/24 0446 11/02/24 0616 11/03/24 0636  WBC 12.7* 10.1 11.5* 10.1 12.2*  NEUTROABS 10.6* 7.2  --   --   --   HGB 9.0* 7.2* 7.4* 7.4* 7.5*  HCT 31.4* 24.2* 25.4* 24.9* 24.9*  MCV 75.5* 74.9* 74.5* 72.8* 72.8*  PLT 633* 516* 641* 585* 569*    Basic Metabolic Panel: Recent Labs  Lab 10/28/24 2331 10/29/24 0650 11/01/24 0446 11/02/24 0616 11/03/24 0636  NA 137 135 139 138 134*  K 3.7 3.4* 3.5 3.4* 3.9  CL 98 98 100 99 98  CO2 29 27 32 29 26  GLUCOSE 124* 101* 160* 79 99  BUN 14 13 22  26* 24*  CREATININE 0.93 0.83 0.71 0.71 0.62  CALCIUM  9.2 8.4* 8.1* 8.3* 8.0*  MG  --  1.5*  --  1.3* 2.1  PHOS  --  3.1  --   --   --  GFR: Estimated Creatinine Clearance: 38.3 mL/min (by C-G formula based on SCr of 0.62 mg/dL). Recent Labs  Lab 10/29/24 0650 11/01/24 0446 11/02/24 0616 11/03/24 0636  WBC 10.1 11.5* 10.1 12.2*    Liver Function Tests: Recent Labs  Lab 10/29/24 0650 11/02/24 0616 11/03/24 0636  AST 28 75* 42*  ALT 10 82* 64*  ALKPHOS 118 115 108  BILITOT 0.2 <0.2 <0.2  PROT 6.1* 5.6* 5.5*  ALBUMIN 3.1* 2.9* 2.8*   No results for input(s): LIPASE, AMYLASE in the last 168 hours. No results for  input(s): AMMONIA in the last 168 hours.  ABG    Component Value Date/Time   TCO2 26 09/15/2021 0733     Coagulation Profile: Recent Labs  Lab 10/28/24 2331  INR 1.1    Cardiac Enzymes: No results for input(s): CKTOTAL, CKMB, CKMBINDEX, TROPONINI in the last 168 hours.  HbA1C: Hgb A1c MFr Bld  Date/Time Value Ref Range Status  10/29/2024 06:50 AM 5.5 4.8 - 5.6 % Final    Comment:    (NOTE) Diagnosis of Diabetes The following HbA1c ranges recommended by the American Diabetes Association (ADA) may be used as an aid in the diagnosis of diabetes mellitus.  Hemoglobin             Suggested A1C NGSP%              Diagnosis  <5.7                   Non Diabetic  5.7-6.4                Pre-Diabetic  >6.4                   Diabetic  <7.0                   Glycemic control for                       adults with diabetes.      CBG: No results for input(s): GLUCAP in the last 168 hours.  Review of Systems:   N/a   Past Medical History:  She,  has a past medical history of Allergic rhinitis, Allergy, Anxiety, Arthritis, Cancer (HCC) (2011), GERD (gastroesophageal reflux disease), Hyperlipidemia, Hypertension, and Shingles.   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   BUNIONECTOMY  2009   CARDIAC CATHETERIZATION     ~ 2004, reported results unremarkable and shoulder symptoms due to bursitis   COLONOSCOPY  2019   COLONOSCOPY WITH PROPOFOL  N/A 10/23/2020   Procedure: COLONOSCOPY WITH PROPOFOL ;  Surgeon: Wilhelmenia Aloha Raddle., MD;  Location: [facility];  Service: Gastroenterology;  Laterality: N/A;   ENDARTERECTOMY FEMORAL Right 02/09/2022   Procedure: RIGHT COMMON FEMORAL ENDARTERECTOMY WITH PROFUNDA PLASTY  AND SUPERFICIAL FEMORAL ARTERY ENDARTERECTOMY;  Surgeon: Gretta Lonni PARAS, MD;  Location[facility] OR;  Service: Vascular;  Laterality: Right;   HEMOSTASIS CLIP PLACEMENT  10/23/2020   Procedure: HEMOSTASIS CLIP PLACEMENT;  Surgeon: Wilhelmenia Aloha Raddle., MD;  Location: [facility];  Service: Gastroenterology;;   INSERTION OF ILIAC STENT Bilateral 02/09/2022   Procedure: BILATERAL COMMON ILIAC ANGIOPLASTY WITH STENTS;  Surgeon: Gretta Lonni PARAS, MD;  Location: [facility] OR;  Service: Vascular;  Laterality: Bilateral;   INTRAOPERATIVE ARTERIOGRAM Right 02/09/2022   Procedure: RIGHT LOWER EXTREMITY ARTERIOGRAM;  Surgeon: Gretta Lonni PARAS, MD;  Location: [facility] OR;  Service: Vascular;  Laterality: Right;   LUMBAR LAMINECTOMY/DECOMPRESSION MICRODISCECTOMY Right 06/18/2021   Procedure: Laminectomy and Foraminotomy - right - Lumbar four-Lumbar five for resection of synovial cyst;  Surgeon: Onetha Kuba, MD;  Location[facility] OR;  Service: Neurosurgery;  Laterality: Right;   PATCH ANGIOPLASTY Right 02/09/2022   Procedure: PATCH ANGIOPLASTY USING XENOSURE 1cm x 14cm BIOLOGIC PATCH;  Surgeon: Gretta Lonni PARAS, MD;  Location: [facility] OR;  Service: Vascular;  Laterality: Right;   POLYPECTOMY  10/23/2020   Procedure: POLYPECTOMY;  Surgeon: Wilhelmenia Aloha Raddle., MD;  Location: [facility];  Service: Gastroenterology;;   SUBMUCOSAL LIFTING INJECTION  10/23/2020   Procedure: SUBMUCOSAL LIFTING INJECTION;  Surgeon: Wilhelmenia Aloha Raddle., MD;  Location: [facility];  Service: Gastroenterology;;   SUBMUCOSAL TATTOO INJECTION  10/23/2020   Procedure: SUBMUCOSAL TATTOO INJECTION;  Surgeon: Wilhelmenia Aloha Raddle., MD;  Location: [facility];  Service: Gastroenterology;;   TOTAL ABDOMINAL HYSTERECTOMY  1987   ULTRASOUND GUIDANCE FOR VASCULAR ACCESS Bilateral 02/09/2022   Procedure: ULTRASOUND GUIDANCE FOR VASCULAR ACCESS LEFT COMMON FEMORAL ARTERY;  Surgeon: Gretta Lonni PARAS, MD;  Location: [facility] OR;  Service:  Vascular;  Laterality: Bilateral;     Social History:   reports that she has quit smoking. Her smoking use included cigarettes. She has never used smokeless tobacco. She reports that she does not currently use alcohol . She reports that she does not use drugs.   Family History:  Her family history includes Breast cancer in her mother; Lung cancer in her father. There is no history of Colon cancer, Colon polyps, Esophageal cancer, Rectal cancer, Stomach cancer, Inflammatory bowel disease, Liver disease, or Pancreatic cancer.   Allergies Allergies[1]   Home Medications  Prior to Admission medications  Medication Sig Start Date End Date Taking? Authorizing Provider  albuterol  (VENTOLIN  HFA) 108 (90 Base) MCG/ACT inhaler Inhale 1-2 puffs into the lungs 4 (four) times daily as needed for shortness of breath or wheezing. 05/07/20  Yes [provider]  Cholecalciferol  (VITAMIN D -1000 MAX ST) 25 MCG (1000 UT) tablet Take 25 mcg by mouth.   Yes [provider]  clopidogrel  (PLAVIX ) 75 MG tablet Take 75 mg by mouth daily.   Yes [provider]  diazepam  (VALIUM ) 5 MG tablet Take 5 mg by mouth 3 (three) times daily as needed for anxiety. 02/27/20  Yes [provider]  lisinopril  (ZESTRIL ) 20 MG tablet Take 20 mg by mouth daily. 03/10/20  Yes [provider]  MYRBETRIQ  50 MG TB24 tablet Take 50 mg by mouth daily. 05/21/17  Yes [provider]  pantoprazole  (PROTONIX ) 40 MG tablet Take 1 tablet (40 mg total) by mouth daily. 06/01/24  Yes Beather Delon Gibson, PA  polyethylene glycol powder (GLYCOLAX /MIRALAX ) 17 GM/SCOOP powder Take by mouth once.   Yes [provider]  rosuvastatin  (CRESTOR ) 20 MG tablet Take 20 mg by mouth daily. 04/03/22  Yes [provider]  acetaminophen  (TYLENOL ) 500 MG tablet Take 500-1,000 mg by mouth every 6 (six) hours as needed for moderate pain. Patient not taking: No sig reported    [provider]   aspirin  EC 81 MG EC tablet Take 1 tablet (81 mg total) by mouth daily. Swallow whole. Patient not taking: Reported on 10/30/2024 02/12/22   Baglia, Corrina, PA-C  Cetirizine HCl (ZYRTEC ALLERGY) 10 MG CAPS Take 10 mg by  mouth daily. Patient not taking: Reported on 10/30/2024 05/21/17   [provider]  diphenhydramine -acetaminophen  (TYLENOL  PM) 25-500 MG TABS tablet Take 1-2 tablets by mouth at bedtime as needed (sleep / pain). Patient not taking: Reported on 10/30/2024    [provider]  docusate sodium  (COLACE) 100 MG capsule Take 100-200 mg by mouth at bedtime as needed for mild constipation. Patient not taking: Reported on 10/30/2024    [provider]  escitalopram (LEXAPRO) 5 MG tablet Take 5 mg by mouth daily. Patient not taking: Reported on 10/30/2024 09/08/24   [provider]  gabapentin  (NEURONTIN ) 100 MG capsule Take 100 mg by mouth 2 (two) times daily. Patient not taking: Reported on 10/30/2024 08/01/21   [provider]  melatonin 3 MG TABS tablet Take 3-6 mg by mouth at bedtime as needed (sleep). Patient not taking: Reported on 10/30/2024    [provider]  mometasone (NASONEX) 50 MCG/ACT nasal spray Place 2 sprays into the nose daily as needed (allergies). Patient not taking: Reported on 10/30/2024 01/18/20   [provider]  montelukast  (SINGULAIR ) 10 MG tablet Take 10 mg by mouth at bedtime. Patient not taking: Reported on 10/29/2024 03/11/20   [provider]  Multiple Vitamin (MULTI-VITAMIN) tablet Take 1 tablet by mouth daily. Patient not taking: Reported on 10/30/2024    [provider]  Omega-3 Fatty Acids (FISH OIL  ULTRA) 1400 MG CAPS Take 1,400 mg by mouth daily. Patient not taking: Reported on 10/30/2024    [provider]  polycarbophil (FIBERCON) 625 MG tablet Take 625 mg by mouth in the morning and at bedtime. Patient not taking: No sig reported    [provider]  sucralfate  (CARAFATE ) 1  g tablet Take 1 tablet (1 g total) by mouth 2 (two) times daily. Try Pantoprazole  for 1 week before starting Carafate  Patient not taking: Reported on 10/30/2024 02/10/23   Mansouraty, Aloha Raddle., MD     Critical care time: NA  Donnice JONELLE Beals, MD [facility] Personal contact information can be found on Amion  If no contact or response made please call [phone] 11/03/2024, 2:46 PM              [1]  Allergies Allergen Reactions   Augmentin [Amoxicillin-Pot Clavulanate] Other (See Comments)    ABDONIMAL PAIN    Clavulanic Acid Hives   Codeine Hives and Other (See Comments)   Alprazolam Other (See Comments)    OVER SEDATES    Bee Venom Hives   Sulfa Antibiotics Rash and Other (See Comments)   Tramadol      Insomnia    Avelox [Moxifloxacin] Rash   Hydrocodone-Acetaminophen  Other (See Comments)    Insomnia    "

## 2024-11-03 NOTE — Progress Notes (Signed)
 There is a continuous bubbling in the air leak chamber of the chest tube ,checked the connections and kinks , made the PULM CCM aware , Patient is comfortable and no any sign of respiratory distress, plan of care ongoing.

## 2024-11-03 NOTE — Progress Notes (Addendum)
 " PROGRESS NOTE   Brandi Mcmahon  FMW:[id number]    DOB: 25-Dec-1940    DOA: 10/28/2024  PCP: Mavis Coy   I have briefly reviewed patients previous medical records in [facility].   Brief Hospital Course:  84 year old female lives with son and daughter-in-law, ambulates with the help of a walker, medical history significant for tobacco abuse, recently quit in September 2025, chronic respiratory failure with hypoxia on 2 L/min [place] oxygen, monoclonal gammopathy, chronic microcytic anemia, CAD, PAD s/p bilateral common iliac artery stent on Plavix , HTN, HLD, GERD, initially presented to [facility] with complaints of 1 week history of productive cough and chest congestion.  She also had an episode of aphasia, facial droop and slurred speech witnessed by her family prior to coming to the ED, episode lasting 10 minutes.  At [facility], sustained tachypnea up to 27/min, transient tachycardia, but no hypoxia documented.  Chest x-ray showed consolidation on the left side along with pleural effusion.  CT chest showed large loculated left pleural effusion and concern for malignant pleural disease such as pleural metastasis or mesothelioma.  S/p 125 mL ultrasound-guided thoracentesis, exudative fluid, cytology pending, case was discussed by TRH with PCCM on 1/7 and patient was transferred to [facility] for further evaluation and management.  Case had also been discussed with neurology for TIA who recommended DAPT and outpatient vascular surgery consultation for critical left ICA stenosis.  At [facility], PCCM consulted, left-sided chest tube placed. Vascular surgery consulted for symptomatic left ICA critical stenosis, TCAR recommended but patient currently not interested, vascular surgery plans to closely follow-up outpatient for same.   Assessment & Plan:   Community-acquired pneumonia Large loculated exudative left parapneumonic pleural effusion Concern for empyema and pulmonary fibrosis Rule out malignancy Has been on IV  ceftriaxone  and azithromycin  since 1/3, continue as per PCCM Blood cultures x 2 negative. PCCM consultation appreciated, s/p left-sided chest tube, a dose of tPA DNase due to loculation.  Pleural fluid Gram stain without organisms, culture negative to date.  Pleural fluid cytology pending. Chest x-ray 1/9: Increased size of basilar lateral left pneumothorax, small to moderate.  Diminished left pleural effusion with residual opacification in the left lower hemithorax.  Hydropneumothorax due to an expanded lung.  Chronic respiratory failure with hypoxia COPD exacerbation Stable on 2 L/min [place] oxygen, continue PCCM have started IV Solu-Medrol  40 mg daily. Continue bronchodilators.  Tobacco use disorder Quit September 2025.  Congratulated and encouraged continued cessation  TIA Suspected secondary to critical left ICA stenosis No recurrence of symptoms since hospital admission. CT head 1/4: No acute intracranial abnormality.  CTA head and neck 1/4: Critical stenosis (>90%) of the left ICA origin, less than 30% stenosis of proximal right ICA and less than 20% stenosis of the carotid siphons.  Ulcerative plaque and moderate calcific plaque in the distal aortic arch, which can be a potential embolic source. MRI brain 1/4: No acute intracranial abnormality. LDL 32.  A1c 5.5.  TTE 1/4: LVEF 60-65%. Therapies recommend home health PT.  No OT follow-up. Currently on aspirin  81 mg daily + Plavix  75 mg daily (held on 1/8 for lytics) through left chest tube and rosuvastatin  20 mg daily.  Discussed with Dr. Annella, PCCM, no plans for further lytics and okay to resume Plavix , ordered. Vascular surgery consultation and follow-up appreciated.  TCAR recommended but patient not ready at this time.  VVS plans to follow outpatient and arrange same.  Patient advised regarding increased stroke risk.  She is aware. 1/9: Discussed  in detail with Dr.Gaurang Palikh, Neurohospitalist.  I had consulted them yesterday.   He advised that there was no need for a formal consultation.  Plan and antiplatelet management per vascular surgery.  Microcytic anemia Thrombocytosis Hemoglobin this admission is stable in the low 7 g range.  However anemia is worse since 2023 Anemia panel: Iron 21, saturation ratio 7, TIBC 329, ferritin 143, folate 9.8 and B12: 922.  Started iron supplements. Trend CBCs periodically and transfuse for hemoglobin 7 g or less.  Hypokalemia Hypomagnesemia Replaced.  GERD PPI  HLD LDL at goal.  Continue statins  Essential hypertension Mildly uncontrolled.  Monitor.  Monoclonal gammopathy Outpatient hematology follow-up.  1 mm ACA aneurysm Noted on CTA head and neck 1/4. As per radiology recommendation, follow-up CTA head in 1 to 2 years to ensure stability  Nutrition Status: Nutrition Problem: Severe Malnutrition Etiology: acute illness Signs/Symptoms: energy intake < or equal to 50% for > or equal to 5 days, severe muscle depletion, severe fat depletion Interventions: Refer to RD note for recommendations Agree with registered dietitian evaluation and management, continue management as per their recommendations.   Body mass index is 22.79 kg/m.   DVT prophylaxis: enoxaparin  (LOVENOX ) injection 40 mg Start: 10/29/24 1000     Code Status: Limited: Do not attempt resuscitation (DNR) -DNR-LIMITED -Do Not Intubate/DNI :  Family Communication: Granddaughter at bedside and son on patient's speaker phone. Disposition:  Status is: Inpatient Remains inpatient appropriate because: Chest tube, IV meds and further evaluation as above.     Consultants:   PCCM Vascular surgery  Procedures:   As above  Subjective:  Denies chest pain or dyspnea.  Mainly complained of no BM since hospital admission at any pain until this morning.  Nursing reported that she had 2 large BMs this morning.  No abdominal pain.  Objective:   Vitals:   11/02/24 2041 11/03/24 0515 11/03/24 0814  11/03/24 0943  BP: (!) 149/84 (!) 138/59  (!) 143/61  Pulse: 85 80 80 89  Resp: 16 19 18 18   Temp: 97.6 F (36.4 C) 97.6 F (36.4 C)  97.7 F (36.5 C)  TempSrc: Oral Oral  Oral  SpO2: 99% 99%  95%  Weight:      Height:        General exam: Elderly female, small built and frail, chronically ill looking lying comfortably propped up in bed without distress.  On 2 L/min [place] oxygen. Respiratory system: Diminished breath sounds in the right base but otherwise right lung fields clear to auscultation.  Improved breath sounds in the left lung fields, still diminished in the base.  Left-sided chest tube connected to suction device. Cardiovascular system: S1 & S2 heard, RRR. No JVD, murmurs, rubs, gallops or clicks. No pedal edema.  Telemetry personally reviewed: Sinus rhythm.  Discontinued telemetry. Gastrointestinal system: Abdomen is nondistended, soft and nontender. No organomegaly or masses felt. Normal bowel sounds heard. Central nervous system: Alert and oriented. No focal neurological deficits. Extremities: Symmetric 5 x 5 power. Skin: No rashes, lesions or ulcers Psychiatry: Judgement and insight appear normal. Mood & affect appropriate.     Data Reviewed:   I have personally reviewed following labs and imaging studies   CBC: Recent Labs  Lab 10/28/24 2331 10/29/24 0650 11/01/24 0446 11/02/24 0616 11/03/24 0636  WBC 12.7* 10.1 11.5* 10.1 12.2*  NEUTROABS 10.6* 7.2  --   --   --   HGB 9.0* 7.2* 7.4* 7.4* 7.5*  HCT 31.4* 24.2* 25.4* 24.9* 24.9*  MCV 75.5* 74.9* 74.5* 72.8* 72.8*  PLT 633* 516* 641* 585* 569*    Basic Metabolic Panel: Recent Labs  Lab 10/28/24 2331 10/29/24 0650 11/01/24 0446 11/02/24 0616 11/03/24 0636  NA 137 135 139 138 134*  K 3.7 3.4* 3.5 3.4* 3.9  CL 98 98 100 99 98  CO2 29 27 32 29 26  GLUCOSE 124* 101* 160* 79 99  BUN 14 13 22  26* 24*  CREATININE 0.93 0.83 0.71 0.71 0.62  CALCIUM  9.2 8.4* 8.1* 8.3* 8.0*  MG  --  1.5*  --  1.3* 2.1  PHOS   --  3.1  --   --   --     Liver Function Tests: Recent Labs  Lab 10/29/24 0650 11/02/24 0616 11/03/24 0636  AST 28 75* 42*  ALT 10 82* 64*  ALKPHOS 118 115 108  BILITOT 0.2 <0.2 <0.2  PROT 6.1* 5.6* 5.5*  ALBUMIN 3.1* 2.9* 2.8*    CBG: No results for input(s): GLUCAP in the last 168 hours.  Microbiology Studies:   Recent Results (from the past 240 hours)  Resp panel by RT-PCR (RSV, Flu A&B, Covid) Anterior Nasal Swab     Status: None   Collection Time: 10/29/24 12:04 AM   Specimen: Anterior Nasal Swab  Result Value Ref Range Status   SARS Coronavirus 2 by RT PCR NEGATIVE NEGATIVE Final    Comment: (NOTE) SARS-CoV-2 target nucleic acids are NOT DETECTED.  The SARS-CoV-2 RNA is generally detectable in upper respiratory specimens during the acute phase of infection. The lowest concentration of SARS-CoV-2 viral copies this assay can detect is 138 copies/mL. A negative result does not preclude SARS-Cov-2 infection and should not be used as the sole basis for treatment or other patient management decisions. A negative result may occur with  improper specimen collection/handling, submission of specimen other than nasopharyngeal swab, presence of viral mutation(s) within the areas targeted by this assay, and inadequate number of viral copies(<138 copies/mL). A negative result must be combined with clinical observations, patient history, and epidemiological information. The expected result is Negative.  Fact Sheet for Patients:  bloggercourse.com  Fact Sheet for Healthcare Providers:  seriousbroker.it  This test is no t yet approved or cleared by the [place]  FDA and  has been authorized for detection and/or diagnosis of SARS-CoV-2 by FDA under an Emergency Use Authorization (EUA). This EUA will remain  in effect (meaning this test can be used) for the duration of the COVID-19 declaration under Section 564(b)(1) of  the Act, 21 U.S.C.section 360bbb-3(b)(1), unless the authorization is terminated  or revoked sooner.       Influenza A by PCR NEGATIVE NEGATIVE Final   Influenza B by PCR NEGATIVE NEGATIVE Final    Comment: (NOTE) The Xpert Xpress SARS-CoV-2/FLU/RSV plus assay is intended as an aid in the diagnosis of influenza from Nasopharyngeal swab specimens and should not be used as a sole basis for treatment. Nasal washings and aspirates are unacceptable for Xpert Xpress SARS-CoV-2/FLU/RSV testing.  Fact Sheet for Patients: bloggercourse.com  Fact Sheet for Healthcare Providers: seriousbroker.it  This test is not yet approved or cleared by the [place]  FDA and has been authorized for detection and/or diagnosis of SARS-CoV-2 by FDA under an Emergency Use Authorization (EUA). This EUA will remain in effect (meaning this test can be used) for the duration of the COVID-19 declaration under Section 564(b)(1) of the Act, 21 U.S.C. section 360bbb-3(b)(1), unless the authorization is terminated or revoked.     Resp  Syncytial Virus by PCR NEGATIVE NEGATIVE Final    Comment: (NOTE) Fact Sheet for Patients: bloggercourse.com  Fact Sheet for Healthcare Providers: seriousbroker.it  This test is not yet approved or cleared by the [place]  FDA and has been authorized for detection and/or diagnosis of SARS-CoV-2 by FDA under an Emergency Use Authorization (EUA). This EUA will remain in effect (meaning this test can be used) for the duration of the COVID-19 declaration under Section 564(b)(1) of the Act, 21 U.S.C. section 360bbb-3(b)(1), unless the authorization is terminated or revoked.  Performed at [facility], [address] [place], [place] [address]   Culture, blood (Routine X 2) w Reflex to ID Panel     Status: None   Collection Time: 10/29/24  6:50 AM   Specimen: BLOOD  Result  Value Ref Range Status   Specimen Description BLOOD LEFT ANTECUBITAL  Final   Special Requests   Final    BOTTLES DRAWN AEROBIC AND ANAEROBIC Blood Culture adequate volume   Culture   Final    NO GROWTH 5 DAYS Performed at [facility], [address], [place], [place] [address]    Report Status 11/03/2024 FINAL  Final  Culture, blood (Routine X 2) w Reflex to ID Panel     Status: None   Collection Time: 10/29/24  6:52 AM   Specimen: BLOOD LEFT ARM  Result Value Ref Range Status   Specimen Description BLOOD LEFT ARM BLOOD  Final   Special Requests   Final    BOTTLES DRAWN AEROBIC AND ANAEROBIC Blood Culture adequate volume   Culture   Final    NO GROWTH 5 DAYS Performed at [facility], [address] [place], [place] [address]    Report Status 11/03/2024 FINAL  Final  Expectorated Sputum Assessment w Gram Stain, Rflx to Resp Cult     Status: None   Collection Time: 10/29/24 12:50 PM   Specimen: Sputum  Result Value Ref Range Status   Specimen Description SPU  Final   Special Requests NONE  Final   Sputum evaluation   Final    THIS SPECIMEN IS ACCEPTABLE FOR SPUTUM CULTURE Performed at [facility], [address] [place], [place] [address]    Report Status 10/29/2024 FINAL  Final  Culture, Respiratory w Gram Stain     Status: None   Collection Time: 10/29/24 12:50 PM   Specimen: Sputum  Result Value Ref Range Status   Specimen Description   Final    SPU Performed at [facility], [address], [place], [place] [address]    Special Requests   Final    NONE Reflexed from [id number] Performed at [facility], [address] [place], [place] [address]    Gram Stain   Final    NO WBC SEEN FEW GRAM POSITIVE COCCI RARE GRAM NEGATIVE RODS    Culture   Final    FEW Normal respiratory flora-no Staph aureus or Pseudomonas seen Performed at [facility], [address], [place], [place] [address]    Report Status 10/31/2024 FINAL  Final  Gram stain     Status: None    Collection Time: 10/30/24  9:45 AM   Specimen: Pleura  Result Value Ref Range Status   Specimen Description PLEURAL  Final   Special Requests NONE  Final   Gram Stain   Final    NO ORGANISMS SEEN WBC PRESENT,BOTH PMN AND MONONUCLEAR CYTOSPIN SMEAR Performed at [facility], [address], [place], [place] [address]  Report Status 10/30/2024 FINAL  Final  Culture, body fluid w Gram Stain-bottle     Status: None (Preliminary result)   Collection Time: 10/30/24  9:45 AM   Specimen: Pleura  Result Value Ref Range Status   Specimen Description PLEURAL BOTTLES DRAWN AEROBIC AND ANAEROBIC  Final   Special Requests 10CC  Final   Culture   Final    NO GROWTH 4 DAYS Performed at [facility], [address] [place], [place] [address]    Report Status PENDING  Incomplete  Culture, body fluid w Gram Stain-bottle     Status: None (Preliminary result)   Collection Time: 11/02/24  9:55 AM   Specimen: Pleura  Result Value Ref Range Status   Specimen Description PLEURAL  Final   Special Requests NONE  Final   Culture   Final    NO GROWTH < 24 HOURS Performed at [facility], [address], [place], [place] [address]    Report Status PENDING  Incomplete  Gram stain     Status: None   Collection Time: 11/02/24  9:55 AM   Specimen: Pleura  Result Value Ref Range Status   Specimen Description PLEURAL  Final   Special Requests NONE  Final   Gram Stain   Final    RARE WBC SEEN NO ORGANISMS SEEN Performed at [facility], [address], [place], [place] [address]    Report Status 11/02/2024 FINAL  Final    Radiology Studies:  DG CHEST PORT 1 VIEW Result Date: 11/03/2024 CLINICAL DATA:  Chest tube in place. EXAM: PORTABLE CHEST 1 VIEW COMPARISON:  Radiograph yesterday FINDINGS: Left basilar pigtail catheter remains in place. Increased size in basal lateral left pneumothorax, small to moderate. Diminished pleural effusion with residual opacification in the lower left  hemithorax. Background chronic lung disease again seen. The heart is grossly normal in size. IMPRESSION: 1. Increased size of basal lateral left pneumothorax, small to moderate. Left basilar pigtail catheter remains in place. 2. Diminished left pleural effusion with residual opacification in the lower left hemithorax. Electronically Signed   By: Andrea Gasman M.D.   On: 11/03/2024 15:16   DG CHEST PORT 1 VIEW Result Date: 11/02/2024 CLINICAL DATA:  Chest tube placement. EXAM: PORTABLE CHEST 1 VIEW COMPARISON:  10/30/2024 FINDINGS: Interval placement of left basilar chest tube. Patient is slightly rotated to the left. Persistent moderate left effusion and stable airspace opacification over the left mid to upper lung. Left lung volume loss with stable mild cardiomediastinal shift to the left. Stable coarse interstitial changes of the right lung. Remainder of the exam is unchanged. IMPRESSION: 1. Interval placement of left basilar chest tube. Persistent moderate left effusion and stable airspace opacification over the left mid to upper lung. Stable left lung volume loss. 2. Stable coarse interstitial changes of the right lung. Electronically Signed   By: Toribio Agreste M.D.   On: 11/02/2024 10:36    Scheduled Meds:    (feeding supplement) PROSource Plus  30 mL Oral BID BM   aspirin  EC  81 mg Oral Daily   dextromethorphan -guaiFENesin   1 tablet Oral BID   enoxaparin  (LOVENOX ) injection  40 mg Subcutaneous Q24H   feeding supplement  1 Container Oral TID BM   ferrous sulfate   325 mg Oral Q breakfast   ipratropium-albuterol   3 mL Nebulization BID   methylPREDNISolone  (SOLU-MEDROL ) injection  40 mg Intravenous Daily   multivitamin with minerals  1 tablet Oral Daily   pantoprazole   40  mg Oral BID   rosuvastatin   20 mg Oral Daily   sodium chloride  flush  10 mL Intrapleural Q8H   thiamine   100 mg Oral Daily    Continuous Infusions:    cefTRIAXone  (ROCEPHIN )  IV 2 g (11/02/24 2143)     LOS: 5 days      Trenda Mar, MD,  FACP, [facility], [facility], [facility]   [facility] [place]   To contact the attending provider between 7A-7P or the covering provider during after hours 7P-7A, please log into the web site [email or web address] and access using universal  password for that web site. If you do not have the password, please call the hospital operator.  11/03/2024, 3:23 PM   "

## 2024-11-03 NOTE — Progress Notes (Signed)

## 2024-11-03 NOTE — Plan of Care (Signed)
  Problem: Education: Goal: Knowledge of General Education information will improve Description: Including pain rating scale, medication(s)/side effects and non-pharmacologic comfort measures Outcome: Progressing   Problem: Health Behavior/Discharge Planning: Goal: Ability to manage health-related needs will improve Outcome: Progressing   Problem: Elimination: Goal: Will not experience complications related to bowel motility Outcome: Progressing Goal: Will not experience complications related to urinary retention Outcome: Progressing

## 2024-11-03 NOTE — Progress Notes (Addendum)
" °  Progress Note    11/03/2024 10:31 AM * No surgery found *  Subjective:  no complaints   Vitals:   11/03/24 0814 11/03/24 0943  BP:  (!) 143/61  Pulse: 80 89  Resp: 18 18  Temp:  97.7 F (36.5 C)  SpO2:  95%   Physical Exam: Cardiac:  regular Lungs:  non labored Extremities:  moving all extremities without deficits Neurologic: alert and oriented. Speech clear and coherent  CBC    Component Value Date/Time   WBC 12.2 (H) 11/03/2024 0636   RBC 3.42 (L) 11/03/2024 0636   HGB 7.5 (L) 11/03/2024 0636   HCT 24.9 (L) 11/03/2024 0636   PLT 569 (H) 11/03/2024 0636   MCV 72.8 (L) 11/03/2024 0636   MCH 21.9 (L) 11/03/2024 0636   MCHC 30.1 11/03/2024 0636   RDW 20.7 (H) 11/03/2024 0636   LYMPHSABS 2.0 10/29/2024 0650   MONOABS 0.8 10/29/2024 0650   EOSABS 0.0 10/29/2024 0650   BASOSABS 0.0 10/29/2024 0650    BMET    Component Value Date/Time   NA 134 (L) 11/03/2024 0636   K 3.9 11/03/2024 0636   CL 98 11/03/2024 0636   CO2 26 11/03/2024 0636   GLUCOSE 99 11/03/2024 0636   BUN 24 (H) 11/03/2024 0636   BUN 13 12/29/2021 1145   CREATININE 0.62 11/03/2024 0636   CALCIUM  8.0 (L) 11/03/2024 0636   GFRNONAA >60 11/03/2024 0636    INR    Component Value Date/Time   INR 1.1 10/28/2024 2331     Intake/Output Summary (Last 24 hours) at 11/03/2024 1031 Last data filed at 11/03/2024 0600 Gross per 24 hour  Intake 535.06 ml  Output 170 ml  Net 365.06 ml     Assessment/Plan:  84 y.o. female with TIA and left ICA high grade stenosis. She is back to her baseline. No new neurological symptoms. She presently is admitted with acute on chronic respiratory failure with multifocal pneumonia. On CT Angio head and neck she has 90% left ICA stenosis. She is on Aspirin  and statin. With her current left chest tube Plavix  is on hold. Will need Plavix  when okay per critical care. Recommendation is for left TCAR. Patient is not interested in intervention at this time. I think it is  reasonable to let her recover from her acute illness. We can arrange outpatient follow up with Dr. Magda in a couple weeks to re discuss medical management vs TCAR.    Teretha Damme, PA-C [facility] [phone] 11/03/2024 10:31 AM  VASCULAR STAFF ADDENDUM: I have independently interviewed and examined the patient. I agree with the above.  Please call for questions Will see her in the office in 3-4 weeks.  Continue DAPT.  Debby SAILOR. Magda, MD [facility] Phone Number: [phone] 11/03/2024 4:25 PM    "

## 2024-11-03 NOTE — Progress Notes (Signed)
 Upon bedside report with oncoming nurse, assessment of output from chest tube was recorded to be 530 cc in 12 hours. MD Hongalgi made aware and stated to page the on call MD. Oncoming nurse will page MD on call. Pt denies any symptoms, Vitals stable, and no c/o SOB.

## 2024-11-04 ENCOUNTER — Inpatient Hospital Stay (HOSPITAL_COMMUNITY)

## 2024-11-04 DIAGNOSIS — J9621 Acute and chronic respiratory failure with hypoxia: Secondary | ICD-10-CM

## 2024-11-04 DIAGNOSIS — J189 Pneumonia, unspecified organism: Secondary | ICD-10-CM | POA: Diagnosis not present

## 2024-11-04 DIAGNOSIS — J939 Pneumothorax, unspecified: Secondary | ICD-10-CM

## 2024-11-04 DIAGNOSIS — G459 Transient cerebral ischemic attack, unspecified: Secondary | ICD-10-CM | POA: Diagnosis not present

## 2024-11-04 DIAGNOSIS — J948 Other specified pleural conditions: Secondary | ICD-10-CM

## 2024-11-04 DIAGNOSIS — J441 Chronic obstructive pulmonary disease with (acute) exacerbation: Secondary | ICD-10-CM | POA: Diagnosis not present

## 2024-11-04 DIAGNOSIS — J90 Pleural effusion, not elsewhere classified: Secondary | ICD-10-CM | POA: Diagnosis not present

## 2024-11-04 LAB — CBC
HCT: 23.8 % — ABNORMAL LOW (ref 36.0–46.0)
Hemoglobin: 7.2 g/dL — ABNORMAL LOW (ref 12.0–15.0)
MCH: 22.1 pg — ABNORMAL LOW (ref 26.0–34.0)
MCHC: 30.3 g/dL (ref 30.0–36.0)
MCV: 73 fL — ABNORMAL LOW (ref 80.0–100.0)
Platelets: 555 K/uL — ABNORMAL HIGH (ref 150–400)
RBC: 3.26 MIL/uL — ABNORMAL LOW (ref 3.87–5.11)
RDW: 20.9 % — ABNORMAL HIGH (ref 11.5–15.5)
WBC: 12 K/uL — ABNORMAL HIGH (ref 4.0–10.5)
nRBC: 0 % (ref 0.0–0.2)

## 2024-11-04 LAB — CULTURE, BODY FLUID W GRAM STAIN -BOTTLE: Culture: NO GROWTH

## 2024-11-04 MED ORDER — LISINOPRIL 20 MG PO TABS
20.0000 mg | ORAL_TABLET | Freq: Every day | ORAL | Status: DC
  Administered 2024-11-04 – 2024-11-07 (×4): 20 mg via ORAL
  Filled 2024-11-04 (×4): qty 1

## 2024-11-04 MED ORDER — DIAZEPAM 5 MG PO TABS: 5.0000 mg | ORAL_TABLET | Freq: Three times a day (TID) | ORAL | Status: DC | PRN

## 2024-11-04 MED ORDER — MIRABEGRON ER 25 MG PO TB24
50.0000 mg | ORAL_TABLET | Freq: Every day | ORAL | Status: DC
  Administered 2024-11-04 – 2024-11-07 (×4): 50 mg via ORAL
  Filled 2024-11-04 (×4): qty 2

## 2024-11-04 MED ORDER — PREDNISONE 20 MG PO TABS
40.0000 mg | ORAL_TABLET | Freq: Every day | ORAL | Status: DC
  Administered 2024-11-05 – 2024-11-06 (×2): 40 mg via ORAL
  Filled 2024-11-04 (×2): qty 2

## 2024-11-04 NOTE — Progress Notes (Signed)
 " PROGRESS NOTE   Brandi Mcmahon  FMW:[id number]    DOB: Jul 22, 1941    DOA: 10/28/2024  PCP: Mavis Coy   I have briefly reviewed patients previous medical records in [facility].   Brief Hospital Course:  84 year old female lives with son and daughter-in-law, ambulates with the help of a walker, medical history significant for tobacco abuse, recently quit in September 2025, chronic respiratory failure with hypoxia on 2 L/min [place] oxygen, monoclonal gammopathy, chronic microcytic anemia, CAD, PAD s/p bilateral common iliac artery stent on Plavix , HTN, HLD, GERD, initially presented to [facility] with complaints of 1 week history of productive cough and chest congestion.  She also had an episode of aphasia, facial droop and slurred speech witnessed by her family prior to coming to the ED, episode lasting 10 minutes.  At [facility], sustained tachypnea up to 27/min, transient tachycardia, but no hypoxia documented.  Chest x-ray showed consolidation on the left side along with pleural effusion.  CT chest showed large loculated left pleural effusion and concern for malignant pleural disease such as pleural metastasis or mesothelioma.  S/p 125 mL ultrasound-guided thoracentesis, exudative fluid, cytology pending, case was discussed by TRH with PCCM on 1/7 and patient was transferred to [facility] for further evaluation and management.  Case had also been discussed with neurology for TIA who recommended DAPT and outpatient vascular surgery consultation for critical left ICA stenosis.  At [facility], PCCM consulted, left-sided chest tube placed. Vascular surgery consulted for symptomatic left ICA critical stenosis, TCAR recommended but patient currently not interested, vascular surgery plans to closely follow-up outpatient for same.   Assessment & Plan:   Community-acquired pneumonia Large loculated exudative left parapneumonic pleural effusion Concern for empyema and pulmonary fibrosis Rule out malignancy Completed  azithromycin  x 5 days.  IV ceftriaxone  1/3 >, duration deferred to PCCM. Blood cultures x 2 negative.   PCCM consulted.  Placed left-sided chest tube and gave lytics for loculation.  Pleural effusion has almost completely drained.  Has hydropneumothorax/pneumothorax ex vacuo due to lack of lung expansion.  Continuing chest tube and daily chest x-ray. Pleural fluid culture 1/4 negative, 1/8: Negative to date.  Pleural fluid cytology, 1/4 showed no malignant cells, indicated chronic inflammation and 1/8: Pending.  Chronic respiratory failure with hypoxia COPD exacerbation Stable on 2 L/min [place] oxygen, continue Transitioned IV Solu-Medrol  to prednisone  40 mg daily from 1/11 Continue bronchodilators.  Tobacco use disorder Quit September 2025.  Congratulated and encouraged continued cessation  TIA Suspected secondary to critical left ICA stenosis No recurrence of symptoms since hospital admission. CT head 1/4: No acute intracranial abnormality.  CTA head and neck 1/4: Critical stenosis (>90%) of the left ICA origin, less than 30% stenosis of proximal right ICA and less than 20% stenosis of the carotid siphons.  Ulcerative plaque and moderate calcific plaque in the distal aortic arch, which can be a potential embolic source. MRI brain 1/4: No acute intracranial abnormality. LDL 32.  A1c 5.5.  TTE 1/4: LVEF 60-65%. Therapies recommend home health PT.  No OT follow-up. Currently on aspirin  81 mg daily + Plavix  75 mg daily (held on 1/8 for lytics) through left chest tube and rosuvastatin  20 mg daily.  Discussed with Dr. Annella, PCCM, no plans for further lytics and okay to resume Plavix , ordered. Vascular surgery consultation and follow-up appreciated.  TCAR recommended but patient not ready at this time.  [facility] plans to follow outpatient and arrange same.  Patient advised regarding increased stroke risk.  She is  aware. 1/9: Discussed in detail with Dr.Gaurang Palikh, Neurohospitalist and was advised  that there was no need for a formal consultation.  Plan and antiplatelet management per vascular surgery.  Microcytic anemia Thrombocytosis Hemoglobin this admission is stable in the low 7 g range.  However anemia is worse since 2023 Anemia panel: Iron 21, saturation ratio 7, TIBC 329, ferritin 143, folate 9.8 and B12: 922.  Started iron supplements. Trend CBCs periodically and transfuse for hemoglobin 7 g or less.  Hemoglobin 7.2, plan to discuss with her in a.m. regarding a unit of PRBC transfusion, especially if hemoglobin trending lower.  Hypokalemia Hypomagnesemia Replaced.  GERD PPI  HLD LDL at goal.  Continue statins  Essential hypertension Mildly uncontrolled.  Monitor.  Monoclonal gammopathy Outpatient hematology follow-up.  1 mm ACA aneurysm Noted on CTA head and neck 1/4. As per radiology recommendation, follow-up CTA head in 1 to 2 years to ensure stability  Anxiety disorder Resumed home dose of as needed Valium .  Nutrition Status: Nutrition Problem: Severe Malnutrition Etiology: acute illness Signs/Symptoms: energy intake < or equal to 50% for > or equal to 5 days, severe muscle depletion, severe fat depletion Interventions: Refer to RD note for recommendations Agree with registered dietitian evaluation and management, continue management as per their recommendations.  Body mass index is 22.79 kg/m.   DVT prophylaxis: enoxaparin  (LOVENOX ) injection 40 mg Start: 10/29/24 1000     Code Status: Limited: Do not attempt resuscitation (DNR) -DNR-LIMITED -Do Not Intubate/DNI :  Family Communication: None at bedside. Disposition:  Status is: Inpatient Remains inpatient appropriate because: Chest tube, IV meds and further evaluation as above.     Consultants:   PCCM Vascular surgery  Procedures:   As above  Subjective:  No complaints.  Specifically no chest pain or dyspnea.  She stated that she has been exercising in bed.  Reports that early on in  admission she was unable to sleep but slept quite a bit all day yesterday and last night.  Discussed with RN regarding mobilizing to chair.  Objective:   Vitals:   11/03/24 1728 11/03/24 2004 11/04/24 0439 11/04/24 0938  BP: (!) 148/86 (!) 159/80 (!) 152/95 (!) 170/73  Pulse: 83 94 89 99  Resp: 19 16 16 16   Temp: (!) 97.4 F (36.3 C)  98.4 F (36.9 C) (!) 97.5 F (36.4 C)  TempSrc: Oral   Oral  SpO2: 97% 96% 99% 97%  Weight:      Height:        General exam: Elderly female, small built and frail, chronically ill looking lying comfortably propped up in bed without distress.  On 2 L/min [place] oxygen. Respiratory system: Clear to auscultation right lung fields.  Improved breath sounds left lung fields.  Still slightly diminished in the left base.  Chest tube in place connected to suction. Cardiovascular system: S1 & S2 heard, RRR. No JVD, murmurs, rubs, gallops or clicks. No pedal edema.  Off telemetry. Gastrointestinal system: Abdomen is nondistended, soft and nontender. No organomegaly or masses felt. Normal bowel sounds heard. Central nervous system: Alert and oriented. No focal neurological deficits. Extremities: Symmetric 5 x 5 power. Skin: No rashes, lesions or ulcers Psychiatry: Judgement and insight appear normal. Mood & affect appropriate.     Data Reviewed:   I have personally reviewed following labs and imaging studies   CBC: Recent Labs  Lab 10/28/24 2331 10/29/24 0650 11/01/24 0446 11/02/24 0616 11/03/24 0636 11/04/24 0303  WBC 12.7* 10.1   < >  10.1 12.2* 12.0*  NEUTROABS 10.6* 7.2  --   --   --   --   HGB 9.0* 7.2*   < > 7.4* 7.5* 7.2*  HCT 31.4* 24.2*   < > 24.9* 24.9* 23.8*  MCV 75.5* 74.9*   < > 72.8* 72.8* 73.0*  PLT 633* 516*   < > 585* 569* 555*   < > = values in this interval not displayed.    Basic Metabolic Panel: Recent Labs  Lab 10/28/24 2331 10/29/24 0650 11/01/24 0446 11/02/24 0616 11/03/24 0636  NA 137 135 139 138 134*  K 3.7 3.4* 3.5  3.4* 3.9  CL 98 98 100 99 98  CO2 29 27 32 29 26  GLUCOSE 124* 101* 160* 79 99  BUN 14 13 22  26* 24*  CREATININE 0.93 0.83 0.71 0.71 0.62  CALCIUM  9.2 8.4* 8.1* 8.3* 8.0*  MG  --  1.5*  --  1.3* 2.1  PHOS  --  3.1  --   --   --     Liver Function Tests: Recent Labs  Lab 10/29/24 0650 11/02/24 0616 11/03/24 0636  AST 28 75* 42*  ALT 10 82* 64*  ALKPHOS 118 115 108  BILITOT 0.2 <0.2 <0.2  PROT 6.1* 5.6* 5.5*  ALBUMIN 3.1* 2.9* 2.8*    CBG: No results for input(s): GLUCAP in the last 168 hours.  Microbiology Studies:   Recent Results (from the past 240 hours)  Resp panel by RT-PCR (RSV, Flu A&B, Covid) Anterior Nasal Swab     Status: None   Collection Time: 10/29/24 12:04 AM   Specimen: Anterior Nasal Swab  Result Value Ref Range Status   SARS Coronavirus 2 by RT PCR NEGATIVE NEGATIVE Final    Comment: (NOTE) SARS-CoV-2 target nucleic acids are NOT DETECTED.  The SARS-CoV-2 RNA is generally detectable in upper respiratory specimens during the acute phase of infection. The lowest concentration of SARS-CoV-2 viral copies this assay can detect is 138 copies/mL. A negative result does not preclude SARS-Cov-2 infection and should not be used as the sole basis for treatment or other patient management decisions. A negative result may occur with  improper specimen collection/handling, submission of specimen other than nasopharyngeal swab, presence of viral mutation(s) within the areas targeted by this assay, and inadequate number of viral copies(<138 copies/mL). A negative result must be combined with clinical observations, patient history, and epidemiological information. The expected result is Negative.  Fact Sheet for Patients:  bloggercourse.com  Fact Sheet for Healthcare Providers:  seriousbroker.it  This test is no t yet approved or cleared by the [place]  FDA and  has been authorized for detection and/or  diagnosis of SARS-CoV-2 by FDA under an Emergency Use Authorization (EUA). This EUA will remain  in effect (meaning this test can be used) for the duration of the COVID-19 declaration under Section 564(b)(1) of the Act, 21 U.S.C.section 360bbb-3(b)(1), unless the authorization is terminated  or revoked sooner.       Influenza A by PCR NEGATIVE NEGATIVE Final   Influenza B by PCR NEGATIVE NEGATIVE Final    Comment: (NOTE) The Xpert Xpress SARS-CoV-2/FLU/RSV plus assay is intended as an aid in the diagnosis of influenza from Nasopharyngeal swab specimens and should not be used as a sole basis for treatment. Nasal washings and aspirates are unacceptable for Xpert Xpress SARS-CoV-2/FLU/RSV testing.  Fact Sheet for Patients: bloggercourse.com  Fact Sheet for Healthcare Providers: seriousbroker.it  This test is not yet approved or cleared by the [place]  [place] FDA and has been authorized for detection and/or diagnosis of SARS-CoV-2 by FDA under an Emergency Use Authorization (EUA). This EUA will remain in effect (meaning this test can be used) for the duration of the COVID-19 declaration under Section 564(b)(1) of the Act, 21 U.S.C. section 360bbb-3(b)(1), unless the authorization is terminated or revoked.     Resp Syncytial Virus by PCR NEGATIVE NEGATIVE Final    Comment: (NOTE) Fact Sheet for Patients: bloggercourse.com  Fact Sheet for Healthcare Providers: seriousbroker.it  This test is not yet approved or cleared by the [place]  FDA and has been authorized for detection and/or diagnosis of SARS-CoV-2 by FDA under an Emergency Use Authorization (EUA). This EUA will remain in effect (meaning this test can be used) for the duration of the COVID-19 declaration under Section 564(b)(1) of the Act, 21 U.S.C. section 360bbb-3(b)(1), unless the authorization is terminated  or revoked.  Performed at [facility], [address] [place], [place] [address]   Culture, blood (Routine X 2) w Reflex to ID Panel     Status: None   Collection Time: 10/29/24  6:50 AM   Specimen: BLOOD  Result Value Ref Range Status   Specimen Description BLOOD LEFT ANTECUBITAL  Final   Special Requests   Final    BOTTLES DRAWN AEROBIC AND ANAEROBIC Blood Culture adequate volume   Culture   Final    NO GROWTH 5 DAYS Performed at [facility], [address], [place], [place] [address]    Report Status 11/03/2024 FINAL  Final  Culture, blood (Routine X 2) w Reflex to ID Panel     Status: None   Collection Time: 10/29/24  6:52 AM   Specimen: BLOOD LEFT ARM  Result Value Ref Range Status   Specimen Description BLOOD LEFT ARM BLOOD  Final   Special Requests   Final    BOTTLES DRAWN AEROBIC AND ANAEROBIC Blood Culture adequate volume   Culture   Final    NO GROWTH 5 DAYS Performed at [facility], [address] [place], [place] [address]    Report Status 11/03/2024 FINAL  Final  Expectorated Sputum Assessment w Gram Stain, Rflx to Resp Cult     Status: None   Collection Time: 10/29/24 12:50 PM   Specimen: Sputum  Result Value Ref Range Status   Specimen Description SPU  Final   Special Requests NONE  Final   Sputum evaluation   Final    THIS SPECIMEN IS ACCEPTABLE FOR SPUTUM CULTURE Performed at [facility], [address], [place], [place] [address]    Report Status 10/29/2024 FINAL  Final  Culture, Respiratory w Gram Stain     Status: None   Collection Time: 10/29/24 12:50 PM   Specimen: Sputum  Result Value Ref Range Status   Specimen Description   Final    SPU Performed at [facility], [address] [place], [place] [address]    Special Requests   Final    NONE Reflexed from [id number] Performed at [facility], [address] [place], [place] [address]    Gram Stain   Final    NO WBC SEEN FEW GRAM POSITIVE COCCI RARE GRAM NEGATIVE RODS    Culture   Final     FEW Normal respiratory flora-no Staph aureus or Pseudomonas seen Performed at [facility], [address] [place], [place] [address]    Report Status 10/31/2024 FINAL  Final  Gram stain     Status: None   Collection  Time: 10/30/24  9:45 AM   Specimen: Pleura  Result Value Ref Range Status   Specimen Description PLEURAL  Final   Special Requests NONE  Final   Gram Stain   Final    NO ORGANISMS SEEN WBC PRESENT,BOTH PMN AND MONONUCLEAR CYTOSPIN SMEAR Performed at [facility], [address] [place], [place] [address]    Report Status 10/30/2024 FINAL  Final  Culture, body fluid w Gram Stain-bottle     Status: None   Collection Time: 10/30/24  9:45 AM   Specimen: Pleura  Result Value Ref Range Status   Specimen Description PLEURAL BOTTLES DRAWN AEROBIC AND ANAEROBIC  Final   Special Requests 10CC  Final   Culture   Final    NO GROWTH 5 DAYS Performed at [facility], [address] [place], [place] [address]    Report Status 11/04/2024 FINAL  Final  Culture, body fluid w Gram Stain-bottle     Status: None (Preliminary result)   Collection Time: 11/02/24  9:55 AM   Specimen: Pleura  Result Value Ref Range Status   Specimen Description PLEURAL  Final   Special Requests NONE  Final   Culture   Final    NO GROWTH 2 DAYS Performed at [facility], [address] [place], [place] [address]    Report Status PENDING  Incomplete  Gram stain     Status: None   Collection Time: 11/02/24  9:55 AM   Specimen: Pleura  Result Value Ref Range Status   Specimen Description PLEURAL  Final   Special Requests NONE  Final   Gram Stain   Final    RARE WBC SEEN NO ORGANISMS SEEN Performed at [facility], [address], [place], [place] [address]    Report Status 11/02/2024 FINAL  Final    Radiology Studies:  DG CHEST PORT 1 VIEW Result Date: 11/04/2024 EXAM: 1 VIEW XRAY OF THE CHEST 11/04/2024 06:18:00 AM COMPARISON: 11/03/2024 CLINICAL HISTORY: Pleural effusion  FINDINGS: LINES, TUBES AND DEVICES: Left sided pleural pigtail catheter is unchanged in position. LUNGS AND PLEURA: Small volume inferolateral left sided pneumothorax is unchanged. There is likely small volume residual left pleural fluid as well. Similar left perihilar and less so infrahilar airspace disease. Moderate underlying interstitial thickening may relate to smoking / chronic bronchitis. HEART AND MEDIASTINUM: Mild cardiomegaly. BONES AND SOFT TISSUES: No acute osseous abnormality. IMPRESSION: 1. Left sided pleural pigtail catheter in place with similar inferolateral left pleural air and likely small volume pleural fluid. 2. Left sided airspace disease is similar, pneumonia or lymphangitic tumor spread based on prior CT. Electronically signed by: Rockey Kilts MD MD 11/04/2024 02:32 PM EST RP Workstation: [id number]   DG CHEST PORT 1 VIEW Result Date: 11/03/2024 CLINICAL DATA:  Chest tube in place. EXAM: PORTABLE CHEST 1 VIEW COMPARISON:  Radiograph yesterday FINDINGS: Left basilar pigtail catheter remains in place. Increased size in basal lateral left pneumothorax, small to moderate. Diminished pleural effusion with residual opacification in the lower left hemithorax. Background chronic lung disease again seen. The heart is grossly normal in size. IMPRESSION: 1. Increased size of basal lateral left pneumothorax, small to moderate. Left basilar pigtail catheter remains in place. 2. Diminished left pleural effusion with residual opacification in the lower left hemithorax. Electronically Signed   By: Andrea Gasman M.D.   On: 11/03/2024 15:16    Scheduled Meds:    (feeding supplement) PROSource Plus  30 mL Oral BID BM   aspirin  EC  81 mg Oral Daily   clopidogrel   75 mg Oral Daily   dextromethorphan -guaiFENesin   1 tablet Oral BID   enoxaparin  (LOVENOX ) injection  40 mg Subcutaneous Q24H   feeding supplement  1 Container Oral TID BM   ferrous sulfate   325 mg Oral Q breakfast   ipratropium-albuterol    3 mL Nebulization BID   lisinopril   20 mg Oral Daily   methylPREDNISolone  (SOLU-MEDROL ) injection  40 mg Intravenous Daily   mirabegron  ER  50 mg Oral Daily   multivitamin with minerals  1 tablet Oral Daily   pantoprazole   40 mg Oral BID   rosuvastatin   20 mg Oral Daily   sodium chloride  flush  10 mL Intrapleural Q8H   sodium chloride  flush  10-40 mL Intracatheter Q12H   thiamine   100 mg Oral Daily    Continuous Infusions:    cefTRIAXone  (ROCEPHIN )  IV 2 g (11/03/24 2119)     LOS: 6 days     Trenda Mar, MD,  FACP, [facility], [facility], [facility]   [facility] [place]   To contact the attending provider between 7A-7P or the covering provider during after hours 7P-7A, please log into the web site [email or web address] and access using universal [place] password for that web site. If you do not have the password, please call the hospital operator.  11/04/2024, 4:28 PM   "

## 2024-11-04 NOTE — Plan of Care (Signed)
" °  Problem: Education: Goal: Knowledge of General Education information will improve Description: Including pain rating scale, medication(s)/side effects and non-pharmacologic comfort measures Outcome: Progressing   Problem: Nutrition: Goal: Adequate nutrition will be maintained Outcome: Progressing   Problem: Elimination: Goal: Will not experience complications related to urinary retention Outcome: Progressing   Problem: Pain Managment: Goal: General experience of comfort will improve and/or be controlled Outcome: Progressing   Problem: Safety: Goal: Ability to remain free from injury will improve Outcome: Progressing   Problem: Education: Goal: Knowledge of patient specific risk factors will improve (DELETE if not current risk factor) Outcome: Progressing   "

## 2024-11-04 NOTE — Progress Notes (Signed)
 Change chest tube canister with charge RN - Kate Ada, RN. Pt is sitting in chair. No air leaks present on change. Will endorse to the ongoing nurse.

## 2024-11-04 NOTE — Plan of Care (Signed)
" °  Problem: Clinical Measurements: Goal: Diagnostic test results will improve Outcome: Progressing Goal: Respiratory complications will improve Outcome: Progressing   Problem: Nutrition: Goal: Adequate nutrition will be maintained Outcome: Not Progressing   Problem: Elimination: Goal: Will not experience complications related to urinary retention Outcome: Progressing   Problem: Health Behavior/Discharge Planning: Goal: Ability to manage health-related needs will improve Outcome: Progressing   "

## 2024-11-04 NOTE — Progress Notes (Signed)
 "  NAME:  Brandi Mcmahon, MRN:  [id number], DOB:  1941/06/22, LOS: 6 ADMISSION DATE:  10/28/2024, CONSULTATION DATE:  10/31/2024 REFERRING MD:  TRH, CHIEF COMPLAINT: Loculated left pleural effusion  History of Present Illness:  Brandi Mcmahon is a 84 year old female with a past medical history significant for HTN, HLD, GERD, left breast cancer, and anxiety who initially presented to the ED at [facility] for reports of weakness.  Additionally she reported 1 week of productive cough with green sputum and congestion.  On ED arrival patient was seen mildly tachypneic, tachycardic, and slightly hypertensive.  Chest x-ray was obtained and revealed consolidation throughout the left lung concerning for pneumonia and possible left pleural effusion.  Additional imaging was obtained with CT chest which revealed a large loculated left pleural effusion with diffuse nodular pleural thickening concerning for metastatic pleural disease with superimposed pneumonia.  Patient was admitted per hospitalist for management of CAP   Pertinent  Medical History  HTN, HLD, GERD, left breast cancer, and anxiety   Significant Hospital Events: Including procedures, antibiotic start and stop dates in addition to other pertinent events   1/4 initially presented for weakness workup revealed significant left pneumonia consistent with CAP and large loculated pleural effusion 1/5 patient underwent thoracentesis per interventional radiology with only 125 mL of serosanguineous fluid removed due to highly loculated effusion 1/7 PCCM consulted for potential need of chest tube and pleural lytics, patient transferred from [facility] 1/8 reduced dose intrapleural lytics   1/9  Chest tube output ~1300 cc, serosanguinous, resolving effusion, hydropneumothorax   Interim History / Subjective:  No complaints  Objective    Blood pressure (!) 152/95, pulse 89, temperature 98.4 F (36.9 C), resp. rate 16, height 5' (1.524 m), weight 52.9 kg, SpO2  99%.    FiO2 (%):  [28 %] 28 %   Intake/Output Summary (Last 24 hours) at 11/04/2024 0742 Last data filed at 11/04/2024 0500 Gross per 24 hour  Intake 10 ml  Output 730 ml  Net -720 ml   Filed Weights   10/29/24 0335 11/02/24 1700  Weight: 54.9 kg 52.9 kg    Examination: General:  Pleasant frail appearing and elderly female sitting in bed in NAD HEENT: MM pink/moist Neuro: Alert, oriented, appropriate, MAE PULM:  non labored, talkative, rales/ rhonchi on left, few right basilar rales, no wheeze, left pigtail CT to 20 sxn with serosang drainage, 1/7 airleak- total of 1.75L in chamber GI: soft, bs, good BM yest Extremities: warm/dry, no LE edema   Afebrile Labs> WBC 12.2> 12, Hgb 7.5> 7.2 CT output documented 727ml/ 24hrs (unclear how accurate this is, noted outpt yesterday around not reflected in I/Os)  Resolved problem list   Assessment and Plan  Acute on chronic hypoxic respiratory failure Large loculated left exudative parapneumonic pleural effusion  Community-acquired pneumonia AECOPD - initial pleural fluid 1/4 neg culture and cytology - 1/6, screened by SLP, no needs identified - completed 5 days azithro - s/p reduced dose pleural lytics 1/8 P:  - cont CT to sxn with residual hydropneumothorax, repeat CXR in am - cont to monitor CT output, no needs for additional pleural lytics at this time with resolving effusion - cont ctx for now, day 7/x, likely plan for extended course - follow pleural cx, GS neg, cytology pending  - duonebs BID and prn - cont prednisone  for 5 days, can change to PO - ongoing pulm hygiene, mucolytic's    Labs   CBC: Recent Labs  Lab  10/28/24 2331 10/29/24 0650 11/01/24 0446 11/02/24 0616 11/03/24 0636 11/04/24 0303  WBC 12.7* 10.1 11.5* 10.1 12.2* 12.0*  NEUTROABS 10.6* 7.2  --   --   --   --   HGB 9.0* 7.2* 7.4* 7.4* 7.5* 7.2*  HCT 31.4* 24.2* 25.4* 24.9* 24.9* 23.8*  MCV 75.5* 74.9* 74.5* 72.8* 72.8* 73.0*  PLT 633*  516* 641* 585* 569* 555*    Basic Metabolic Panel: Recent Labs  Lab 10/28/24 2331 10/29/24 0650 11/01/24 0446 11/02/24 0616 11/03/24 0636  NA 137 135 139 138 134*  K 3.7 3.4* 3.5 3.4* 3.9  CL 98 98 100 99 98  CO2 29 27 32 29 26  GLUCOSE 124* 101* 160* 79 99  BUN 14 13 22  26* 24*  CREATININE 0.93 0.83 0.71 0.71 0.62  CALCIUM  9.2 8.4* 8.1* 8.3* 8.0*  MG  --  1.5*  --  1.3* 2.1  PHOS  --  3.1  --   --   --    GFR: Estimated Creatinine Clearance: 38.3 mL/min (by C-G formula based on SCr of 0.62 mg/dL). Recent Labs  Lab 11/01/24 0446 11/02/24 0616 11/03/24 0636 11/04/24 0303  WBC 11.5* 10.1 12.2* 12.0*    Liver Function Tests: Recent Labs  Lab 10/29/24 0650 11/02/24 0616 11/03/24 0636  AST 28 75* 42*  ALT 10 82* 64*  ALKPHOS 118 115 108  BILITOT 0.2 <0.2 <0.2  PROT 6.1* 5.6* 5.5*  ALBUMIN 3.1* 2.9* 2.8*   No results for input(s): LIPASE, AMYLASE in the last 168 hours. No results for input(s): AMMONIA in the last 168 hours.  ABG    Component Value Date/Time   TCO2 26 09/15/2021 0733     Coagulation Profile: Recent Labs  Lab 10/28/24 2331  INR 1.1    Cardiac Enzymes: No results for input(s): CKTOTAL, CKMB, CKMBINDEX, TROPONINI in the last 168 hours.  HbA1C: Hgb A1c MFr Bld  Date/Time Value Ref Range Status  10/29/2024 06:50 AM 5.5 4.8 - 5.6 % Final    Comment:    (NOTE) Diagnosis of Diabetes The following HbA1c ranges recommended by the American Diabetes Association (ADA) may be used as an aid in the diagnosis of diabetes mellitus.  Hemoglobin             Suggested A1C NGSP%              Diagnosis  <5.7                   Non Diabetic  5.7-6.4                Pre-Diabetic  >6.4                   Diabetic  <7.0                   Glycemic control for                       adults with diabetes.      CBG: No results for input(s): GLUCAP in the last 168 hours.  Allergies Allergies[1]   Home Medications  Prior to  Admission medications  Medication Sig Start Date End Date Taking? Authorizing Provider  albuterol  (VENTOLIN  HFA) 108 (90 Base) MCG/ACT inhaler Inhale 1-2 puffs into the lungs 4 (four) times daily as needed for shortness of breath or wheezing. 05/07/20  Yes [provider]  Cholecalciferol  (VITAMIN D -1000 MAX ST) 25 MCG (1000 UT)  tablet Take 25 mcg by mouth.   Yes [provider]  clopidogrel  (PLAVIX ) 75 MG tablet Take 75 mg by mouth daily.   Yes [provider]  diazepam  (VALIUM ) 5 MG tablet Take 5 mg by mouth 3 (three) times daily as needed for anxiety. 02/27/20  Yes [provider]  lisinopril  (ZESTRIL ) 20 MG tablet Take 20 mg by mouth daily. 03/10/20  Yes [provider]  MYRBETRIQ  50 MG TB24 tablet Take 50 mg by mouth daily. 05/21/17  Yes [provider]  pantoprazole  (PROTONIX ) 40 MG tablet Take 1 tablet (40 mg total) by mouth daily. 06/01/24  Yes Beather Delon Gibson, PA  polyethylene glycol powder (GLYCOLAX /MIRALAX ) 17 GM/SCOOP powder Take by mouth once.   Yes [provider]  rosuvastatin  (CRESTOR ) 20 MG tablet Take 20 mg by mouth daily. 04/03/22  Yes [provider]  acetaminophen  (TYLENOL ) 500 MG tablet Take 500-1,000 mg by mouth every 6 (six) hours as needed for moderate pain. Patient not taking: No sig reported    [provider]  aspirin  EC 81 MG EC tablet Take 1 tablet (81 mg total) by mouth daily. Swallow whole. Patient not taking: Reported on 10/30/2024 02/12/22   Baglia, Corrina, PA-C  Cetirizine HCl (ZYRTEC ALLERGY) 10 MG CAPS Take 10 mg by mouth daily. Patient not taking: Reported on 10/30/2024 05/21/17   [provider]  diphenhydramine -acetaminophen  (TYLENOL  PM) 25-500 MG TABS tablet Take 1-2 tablets by mouth at bedtime as needed (sleep / pain). Patient not taking: Reported on 10/30/2024    [provider]  docusate sodium  (COLACE) 100 MG capsule Take 100-200 mg by mouth at bedtime as needed  for mild constipation. Patient not taking: Reported on 10/30/2024    [provider]  escitalopram (LEXAPRO) 5 MG tablet Take 5 mg by mouth daily. Patient not taking: Reported on 10/30/2024 09/08/24   [provider]  gabapentin  (NEURONTIN ) 100 MG capsule Take 100 mg by mouth 2 (two) times daily. Patient not taking: Reported on 10/30/2024 08/01/21   [provider]  melatonin 3 MG TABS tablet Take 3-6 mg by mouth at bedtime as needed (sleep). Patient not taking: Reported on 10/30/2024    [provider]  mometasone (NASONEX) 50 MCG/ACT nasal spray Place 2 sprays into the nose daily as needed (allergies). Patient not taking: Reported on 10/30/2024 01/18/20   [provider]  montelukast  (SINGULAIR ) 10 MG tablet Take 10 mg by mouth at bedtime. Patient not taking: Reported on 10/29/2024 03/11/20   [provider]  Multiple Vitamin (MULTI-VITAMIN) tablet Take 1 tablet by mouth daily. Patient not taking: Reported on 10/30/2024    [provider]  Omega-3 Fatty Acids (FISH OIL  ULTRA) 1400 MG CAPS Take 1,400 mg by mouth daily. Patient not taking: Reported on 10/30/2024    [provider]  polycarbophil (FIBERCON) 625 MG tablet Take 625 mg by mouth in the morning and at bedtime. Patient not taking: No sig reported    [provider]  sucralfate  (CARAFATE ) 1 g tablet Take 1 tablet (1 g total) by mouth 2 (two) times daily. Try Pantoprazole  for 1 week before starting Carafate  Patient not taking: Reported on 10/30/2024 02/10/23   Mansouraty, Aloha Raddle., MD     Critical care time: NA     Lyle Pesa, NP [facility] 11/04/2024, 7:42 AM  See Amion for pager If no response to pager , please call [phone] until 7pm After 7:00 pm call Elink  [phone]             [  1]  Allergies Allergen Reactions   Augmentin [Amoxicillin-Pot Clavulanate] Other (See Comments)    ABDONIMAL PAIN    Clavulanic Acid  Hives   Codeine Hives and Other (See Comments)   Alprazolam Other (See Comments)    OVER SEDATES    Bee Venom Hives   Sulfa Antibiotics Rash and Other (See Comments)   Tramadol      Insomnia    Avelox [Moxifloxacin] Rash   Hydrocodone-Acetaminophen  Other (See Comments)    Insomnia    "

## 2024-11-04 NOTE — Progress Notes (Signed)
 11/04/2024 Case discussed with day team. Will clamp tube and see if lung stays up; if it does we can remove tube from trapped space. If increased SOB or chest pain unclamp and get stat CXR.  Rolan Sharps MD PCCM

## 2024-11-04 NOTE — Progress Notes (Signed)
 PT Cancellation Note  Patient Details Name: Brandi Mcmahon MRN: [id number] DOB: [birth year]   Cancelled Treatment:    Reason Eval/Treat Not Completed: (P) Patient declined, no reason specified (reports she is having severe back pain and now is not a good time.  Will f/u per POC.  PTA and grandson tried to encourage patient to participate and she continues to decline.)   Zayne Marovich J Sharl 11/04/2024, 4:40 PM  Keynan Heffern R. , PTA Acute Rehabilitation Services Office [phone]

## 2024-11-04 NOTE — Progress Notes (Signed)
 Called back to evaluate CT as unable to flush.    Appears to have kink at insertion site.  Once dressing off, was able to flush.  Tender at site, unable to adjust.  Redressed and not flushing.  Pt asymptomatic.  Discussed with attending, plans to leave in for now and recheck CXR in 6 hrs or sooner if becomes symptomatic.     Lyle Pesa, NP [facility] 11/04/2024, 11:10 AM  See Amion for pager If no response to pager , please call [phone] until 7pm After 7:00 pm call Elink  [phone]

## 2024-11-05 ENCOUNTER — Inpatient Hospital Stay (HOSPITAL_COMMUNITY)

## 2024-11-05 DIAGNOSIS — J9621 Acute and chronic respiratory failure with hypoxia: Secondary | ICD-10-CM | POA: Diagnosis not present

## 2024-11-05 DIAGNOSIS — J948 Other specified pleural conditions: Secondary | ICD-10-CM | POA: Diagnosis not present

## 2024-11-05 DIAGNOSIS — J189 Pneumonia, unspecified organism: Secondary | ICD-10-CM | POA: Diagnosis not present

## 2024-11-05 DIAGNOSIS — G459 Transient cerebral ischemic attack, unspecified: Secondary | ICD-10-CM | POA: Diagnosis not present

## 2024-11-05 DIAGNOSIS — J90 Pleural effusion, not elsewhere classified: Secondary | ICD-10-CM | POA: Diagnosis not present

## 2024-11-05 LAB — CBC
HCT: 22.9 % — ABNORMAL LOW (ref 36.0–46.0)
Hemoglobin: 6.8 g/dL — CL (ref 12.0–15.0)
MCH: 21.9 pg — ABNORMAL LOW (ref 26.0–34.0)
MCHC: 29.7 g/dL — ABNORMAL LOW (ref 30.0–36.0)
MCV: 73.9 fL — ABNORMAL LOW (ref 80.0–100.0)
Platelets: 663 K/uL — ABNORMAL HIGH (ref 150–400)
RBC: 3.1 MIL/uL — ABNORMAL LOW (ref 3.87–5.11)
RDW: 20.9 % — ABNORMAL HIGH (ref 11.5–15.5)
WBC: 14 K/uL — ABNORMAL HIGH (ref 4.0–10.5)
nRBC: 0 % (ref 0.0–0.2)

## 2024-11-05 LAB — HEMOGLOBIN AND HEMATOCRIT, BLOOD
HCT: 30.5 % — ABNORMAL LOW (ref 36.0–46.0)
Hemoglobin: 9.6 g/dL — ABNORMAL LOW (ref 12.0–15.0)

## 2024-11-05 LAB — CREATININE, SERUM
Creatinine, Ser: 0.65 mg/dL (ref 0.44–1.00)
GFR, Estimated: >60 mL/min

## 2024-11-05 LAB — PREPARE RBC (CROSSMATCH)

## 2024-11-05 MED ORDER — ACETAMINOPHEN 325 MG PO TABS: 650.0000 mg | ORAL_TABLET | Freq: Four times a day (QID) | ORAL | Status: DC | PRN

## 2024-11-05 MED ORDER — CLOTRIMAZOLE 10 MG MT TROC
10.0000 mg | Freq: Every day | OROMUCOSAL | Status: DC
  Administered 2024-11-05 – 2024-11-07 (×9): 10 mg via ORAL
  Filled 2024-11-05 (×12): qty 1

## 2024-11-05 MED ORDER — LIDOCAINE 5 % EX PTCH
1.0000 | MEDICATED_PATCH | CUTANEOUS | Status: DC
  Administered 2024-11-05 – 2024-11-06 (×2): 1 via TRANSDERMAL
  Filled 2024-11-05 (×2): qty 1

## 2024-11-05 MED ORDER — SODIUM CHLORIDE 0.9% IV SOLUTION: Freq: Once | INTRAVENOUS | Status: AC

## 2024-11-05 NOTE — Progress Notes (Signed)
 "  NAME:  Brandi Mcmahon, MRN:  [id number], DOB:  [birth year], LOS: 7 ADMISSION DATE:  10/28/2024, CONSULTATION DATE:  10/31/2024 REFERRING MD:  TRH, CHIEF COMPLAINT: Loculated left pleural effusion  History of Present Illness:  Brandi Mcmahon is a 84 year old female with a past medical history significant for HTN, HLD, GERD, left breast cancer, and anxiety who initially presented to the ED at [facility] for reports of weakness.  Additionally she reported 1 week of productive cough with green sputum and congestion.  On ED arrival patient was seen mildly tachypneic, tachycardic, and slightly hypertensive.  Chest x-ray was obtained and revealed consolidation throughout the left lung concerning for pneumonia and possible left pleural effusion.  Additional imaging was obtained with CT chest which revealed a large loculated left pleural effusion with diffuse nodular pleural thickening concerning for metastatic pleural disease with superimposed pneumonia.  Patient was admitted per hospitalist for management of CAP   Pertinent  Medical History  HTN, HLD, GERD, left breast cancer, and anxiety   Significant Hospital Events: Including procedures, antibiotic start and stop dates in addition to other pertinent events   1/4 initially presented for weakness workup revealed significant left pneumonia consistent with CAP and large loculated pleural effusion 1/5 patient underwent thoracentesis per interventional radiology with only 125 mL of serosanguineous fluid removed due to highly loculated effusion 1/7 PCCM consulted for potential need of chest tube and pleural lytics, patient transferred from [facility] 1/8 reduced dose intrapleural lytics   1/9  Chest tube output ~1300 cc, serosanguinous, resolving effusion, hydropneumothorax   Interim History / Subjective:  Breahting stable and improved on Home O2  Objective    Blood pressure (!) 146/90, pulse 80, temperature 98 F (36.7 C), temperature source Oral, resp.  rate 18, height 5' (1.524 m), weight 52.9 kg, SpO2 98%.        Intake/Output Summary (Last 24 hours) at 11/05/2024 1521 Last data filed at 11/05/2024 1050 Gross per 24 hour  Intake 320 ml  Output 450 ml  Net -130 ml   Filed Weights   10/29/24 0335 11/02/24 1700  Weight: 54.9 kg 52.9 kg    Examination: General: Elderly, frail Eyes: EOMI Pulmonary: Chest tube in place, clamped, normal work of breathing  CXR stable no worsened PTX  Resolved problem list   Assessment and Plan  Acute on chronic hypoxic respiratory failure Large loculated left exudative parapneumonic pleural effusion  Community-acquired pneumonia Pneumothorax ex vacuo -Continue abx for pna -Chest tube s/p 1 dose lytics with good output -CXR with near evacuation of effusion and residual hydropneumothorax due to lack of lung re-expansion - Chest tube being clamped, lung will not reexpand, that space will fill back up with fluid and eventually form fibrothorax, chest tube removal ordered  PCCM will sign off  Labs   CBC: Recent Labs  Lab 11/01/24 0446 11/02/24 0616 11/03/24 0636 11/04/24 0303 11/05/24 0404  WBC 11.5* 10.1 12.2* 12.0* 14.0*  HGB 7.4* 7.4* 7.5* 7.2* 6.8*  HCT 25.4* 24.9* 24.9* 23.8* 22.9*  MCV 74.5* 72.8* 72.8* 73.0* 73.9*  PLT 641* 585* 569* 555* 663*    Basic Metabolic Panel: Recent Labs  Lab 11/01/24 0446 11/02/24 0616 11/03/24 0636 11/05/24 0404  NA 139 138 134*  --   K 3.5 3.4* 3.9  --   CL 100 99 98  --   CO2 32 29 26  --   GLUCOSE 160* 79 99  --   BUN 22 26* 24*  --  CREATININE 0.71 0.71 0.62 0.65  CALCIUM  8.1* 8.3* 8.0*  --   MG  --  1.3* 2.1  --    GFR: Estimated Creatinine Clearance: 38.3 mL/min (by C-G formula based on SCr of 0.65 mg/dL). Recent Labs  Lab 11/02/24 0616 11/03/24 0636 11/04/24 0303 11/05/24 0404  WBC 10.1 12.2* 12.0* 14.0*    Liver Function Tests: Recent Labs  Lab 11/02/24 0616 11/03/24 0636  AST 75* 42*  ALT 82* 64*  ALKPHOS 115 108   BILITOT <0.2 <0.2  PROT 5.6* 5.5*  ALBUMIN 2.9* 2.8*   No results for input(s): LIPASE, AMYLASE in the last 168 hours. No results for input(s): AMMONIA in the last 168 hours.  ABG    Component Value Date/Time   TCO2 26 09/15/2021 0733     Coagulation Profile: No results for input(s): INR, PROTIME in the last 168 hours.   Cardiac Enzymes: No results for input(s): CKTOTAL, CKMB, CKMBINDEX, TROPONINI in the last 168 hours.  HbA1C: Hgb A1c MFr Bld  Date/Time Value Ref Range Status  10/29/2024 06:50 AM 5.5 4.8 - 5.6 % Final    Comment:    (NOTE) Diagnosis of Diabetes The following HbA1c ranges recommended by the American Diabetes Association (ADA) may be used as an aid in the diagnosis of diabetes mellitus.  Hemoglobin             Suggested A1C NGSP%              Diagnosis  <5.7                   Non Diabetic  5.7-6.4                Pre-Diabetic  >6.4                   Diabetic  <7.0                   Glycemic control for                       adults with diabetes.      CBG: No results for input(s): GLUCAP in the last 168 hours.  Allergies Allergies[1]   Home Medications  Prior to Admission medications  Medication Sig Start Date End Date Taking? Authorizing Provider  albuterol  (VENTOLIN  HFA) 108 (90 Base) MCG/ACT inhaler Inhale 1-2 puffs into the lungs 4 (four) times daily as needed for shortness of breath or wheezing. 05/07/20  Yes [provider]  Cholecalciferol  (VITAMIN D -1000 MAX ST) 25 MCG (1000 UT) tablet Take 25 mcg by mouth.   Yes [provider]  clopidogrel  (PLAVIX ) 75 MG tablet Take 75 mg by mouth daily.   Yes [provider]  diazepam  (VALIUM ) 5 MG tablet Take 5 mg by mouth 3 (three) times daily as needed for anxiety. 02/27/20  Yes [provider]  lisinopril  (ZESTRIL ) 20 MG tablet Take 20 mg by mouth daily. 03/10/20  Yes [provider]  MYRBETRIQ  50 MG TB24 tablet Take 50 mg by mouth  daily. 05/21/17  Yes [provider]  pantoprazole  (PROTONIX ) 40 MG tablet Take 1 tablet (40 mg total) by mouth daily. 06/01/24  Yes Beather Delon Gibson, PA  polyethylene glycol powder (GLYCOLAX /MIRALAX ) 17 GM/SCOOP powder Take by mouth once.   Yes [provider]  rosuvastatin  (CRESTOR ) 20 MG tablet Take 20 mg by mouth daily. 04/03/22  Yes [provider]  acetaminophen  (TYLENOL ) 500 MG  tablet Take 500-1,000 mg by mouth every 6 (six) hours as needed for moderate pain. Patient not taking: No sig reported    [provider]  aspirin  EC 81 MG EC tablet Take 1 tablet (81 mg total) by mouth daily. Swallow whole. Patient not taking: Reported on 10/30/2024 02/12/22   Baglia, Corrina, PA-C  Cetirizine HCl (ZYRTEC ALLERGY) 10 MG CAPS Take 10 mg by mouth daily. Patient not taking: Reported on 10/30/2024 05/21/17   [provider]  diphenhydramine -acetaminophen  (TYLENOL  PM) 25-500 MG TABS tablet Take 1-2 tablets by mouth at bedtime as needed (sleep / pain). Patient not taking: Reported on 10/30/2024    [provider]  docusate sodium  (COLACE) 100 MG capsule Take 100-200 mg by mouth at bedtime as needed for mild constipation. Patient not taking: Reported on 10/30/2024    [provider]  escitalopram (LEXAPRO) 5 MG tablet Take 5 mg by mouth daily. Patient not taking: Reported on 10/30/2024 09/08/24   [provider]  gabapentin  (NEURONTIN ) 100 MG capsule Take 100 mg by mouth 2 (two) times daily. Patient not taking: Reported on 10/30/2024 08/01/21   [provider]  melatonin 3 MG TABS tablet Take 3-6 mg by mouth at bedtime as needed (sleep). Patient not taking: Reported on 10/30/2024    [provider]  mometasone (NASONEX) 50 MCG/ACT nasal spray Place 2 sprays into the nose daily as needed (allergies). Patient not taking: Reported on 10/30/2024 01/18/20   [provider]  montelukast  (SINGULAIR ) 10 MG tablet Take 10 mg by mouth  at bedtime. Patient not taking: Reported on 10/29/2024 03/11/20   [provider]  Multiple Vitamin (MULTI-VITAMIN) tablet Take 1 tablet by mouth daily. Patient not taking: Reported on 10/30/2024    [provider]  Omega-3 Fatty Acids (FISH OIL  ULTRA) 1400 MG CAPS Take 1,400 mg by mouth daily. Patient not taking: Reported on 10/30/2024    [provider]  polycarbophil (FIBERCON) 625 MG tablet Take 625 mg by mouth in the morning and at bedtime. Patient not taking: No sig reported    [provider]  sucralfate  (CARAFATE ) 1 g tablet Take 1 tablet (1 g total) by mouth 2 (two) times daily. Try Pantoprazole  for 1 week before starting Carafate  Patient not taking: Reported on 10/30/2024 02/10/23   Mansouraty, Aloha Raddle., MD     Critical care time: NA     Donnice JONELLE Beals, MD [place] 11/05/2024, 3:21 PM  See Amion for pager If no response to pager , please call [phone] until 7pm After 7:00 pm call Elink  [phone]              [1]  Allergies Allergen Reactions   Augmentin [Amoxicillin-Pot Clavulanate] Other (See Comments)    ABDONIMAL PAIN    Clavulanic Acid Hives   Codeine Hives and Other (See Comments)   Alprazolam Other (See Comments)    OVER SEDATES    Bee Venom Hives   Sulfa Antibiotics Rash and Other (See Comments)   Tramadol      Insomnia    Avelox [Moxifloxacin] Rash   Hydrocodone-Acetaminophen  Other (See Comments)    Insomnia    "

## 2024-11-05 NOTE — Progress Notes (Signed)
 " PROGRESS NOTE   Brandi Mcmahon  FMW:[id number]    DOB: 10/25/1941    DOA: 10/28/2024  PCP: Mavis Coy   I have briefly reviewed patients previous medical records in [facility].   Brief Hospital Course:  84 year old female lives with son and daughter-in-law, ambulates with the help of a walker, medical history significant for tobacco abuse, recently quit in September 2025, chronic respiratory failure with hypoxia on 2 L/min [place] oxygen, monoclonal gammopathy, chronic microcytic anemia, CAD, PAD s/p bilateral common iliac artery stent on Plavix , HTN, HLD, GERD, initially presented to [facility] with complaints of 1 week history of productive cough and chest congestion.  She also had an episode of aphasia, facial droop and slurred speech witnessed by her family prior to coming to the ED, episode lasting 10 minutes.  At [facility], sustained tachypnea up to 27/min, transient tachycardia, but no hypoxia documented.  Chest x-ray showed consolidation on the left side along with pleural effusion.  CT chest showed large loculated left pleural effusion and concern for malignant pleural disease such as pleural metastasis or mesothelioma.  S/p 125 mL ultrasound-guided thoracentesis, exudative fluid, cytology pending, case was discussed by TRH with PCCM on 1/7 and patient was transferred to [facility] for further evaluation and management.  Case had also been discussed with neurology for TIA who recommended DAPT and outpatient vascular surgery consultation for critical left ICA stenosis.  At [facility], PCCM consulted, left-sided chest tube placed. Vascular surgery consulted for symptomatic left ICA critical stenosis, TCAR recommended but patient currently not interested, vascular surgery plans to closely follow-up outpatient for same.   Assessment & Plan:   Community-acquired pneumonia Large loculated exudative left parapneumonic pleural effusion Concern for empyema and pulmonary fibrosis Rule out malignancy Completed  azithromycin  x 5 days.  IV ceftriaxone  1/3 >, duration deferred to PCCM. Blood cultures x 2 negative.   PCCM consulted.  Placed left-sided chest tube and gave lytics for loculation.  Pleural effusion has almost completely drained.  Has hydropneumothorax/pneumothorax ex vacuo due to lack of lung expansion.  Overnight 1/10, chest tube was clamped continuing chest tube and daily chest x-ray.  1/11 Chest x-ray continues to show moderate left hydropneumothorax but smaller than prior. Pleural fluid culture 1/4 negative, 1/8: Negative to date.  Pleural fluid cytology, 1/4 showed no malignant cells, indicated chronic inflammation and 1/8: Pending.  Chronic respiratory failure with hypoxia COPD exacerbation Stable on 2 L/min [place] oxygen, continue Transitioned IV Solu-Medrol  to prednisone  40 mg daily from 1/11 Continue bronchodilators.  Tobacco use disorder Quit September 2025.  Congratulated and encouraged continued cessation  TIA Suspected secondary to critical left ICA stenosis No recurrence of symptoms since hospital admission. CT head 1/4: No acute intracranial abnormality.  CTA head and neck 1/4: Critical stenosis (>90%) of the left ICA origin, less than 30% stenosis of proximal right ICA and less than 20% stenosis of the carotid siphons.  Ulcerative plaque and moderate calcific plaque in the distal aortic arch, which can be a potential embolic source. MRI brain 1/4: No acute intracranial abnormality. LDL 32.  A1c 5.5.  TTE 1/4: LVEF 60-65%. Therapies recommend home health PT.  No OT follow-up. Currently on aspirin  81 mg daily + Plavix  75 mg daily (held on 1/8 for lytics) through left chest tube and rosuvastatin  20 mg daily.  Discussed with Dr. Annella, PCCM, no plans for further lytics and okay to resume Plavix , ordered. Vascular surgery consultation and follow-up appreciated.  TCAR recommended but patient not ready at this  time.  [facility] plans to follow outpatient and arrange same.  Patient advised  regarding increased stroke risk.  She is aware. 1/9: Discussed in detail with Dr.Gaurang Palikh, Neurohospitalist and was advised that there was no need for a formal consultation.  Plan and antiplatelet management per vascular surgery.  Microcytic anemia Thrombocytosis Hemoglobin this admission is stable in the low 7 g range.  However anemia is worse since 2023 Anemia panel: Iron 21, saturation ratio 7, TIBC 329, ferritin 143, folate 9.8 and B12: 922.  Started iron supplements. 1/11, hemoglobin down to 6.8, s/p 1 unit PRBC, follow-up posttransfusion CBCs and a.m. to keep hemoglobin >7 g per DL.  No overt bleeding.  Hypokalemia Hypomagnesemia Replaced.  GERD PPI  HLD LDL at goal.  Continue statins  Essential hypertension Mildly uncontrolled.  Monitor.  Monoclonal gammopathy Outpatient hematology follow-up.  1 mm ACA aneurysm Noted on CTA head and neck 1/4. As per radiology recommendation, follow-up CTA head in 1 to 2 years to ensure stability  Anxiety disorder Resumed home dose of as needed Valium .  Right sided sciatica/chronic low back pain Acetaminophen , lidocaine  patch, out of bed to chair.  Monitor.  Oral thrush Started clotrimazole   Nutrition Status: Nutrition Problem: Severe Malnutrition Etiology: acute illness Signs/Symptoms: energy intake < or equal to 50% for > or equal to 5 days, severe muscle depletion, severe fat depletion Interventions: Refer to RD note for recommendations Agree with registered dietitian evaluation and management, continue management as per their recommendations.  Body mass index is 22.79 kg/m.   DVT prophylaxis: enoxaparin  (LOVENOX ) injection 40 mg Start: 10/29/24 1000     Code Status: Limited: Do not attempt resuscitation (DNR) -DNR-LIMITED -Do Not Intubate/DNI :  Family Communication: 2 daughter-in-law's at bedside. Disposition:  Status is: Inpatient Remains inpatient appropriate because: Chest tube, IV meds and further evaluation  as above.     Consultants:   PCCM Vascular surgery  Procedures:   As above  Subjective:  States that since sometime last night, she has noticed worsening right lower back/sciatica pain.  She indicates that Tylenol  works best for her and also staying out of bed.  She also complains of sore tongue but has not received the Magic mouthwash ordered 2 days ago, reminded nursing and patient.  BM on 1/10  Objective:   Vitals:   11/05/24 0758 11/05/24 0830 11/05/24 1044 11/05/24 1050  BP: (!) 162/76 (!) 159/57 (!) 146/90 (!) 146/90  Pulse: 75 72  80  Resp: 18 18  18   Temp: 97.6 F (36.4 C) 97.6 F (36.4 C)  98 F (36.7 C)  TempSrc: Oral Oral  Oral  SpO2: 100% 99%  98%  Weight:      Height:        General exam: Elderly female, small built and frail, chronically ill looking lying comfortably propped up in bed without distress.  On 2 L/min [place] oxygen. Respiratory system: Clear to auscultation right lung fields.  Improved breath sounds left lung fields.  Still slightly diminished in the left base.  Chest tube in place connected to suction. Cardiovascular system: S1 & S2 heard, RRR. No JVD, murmurs, rubs, gallops or clicks. No pedal edema.  Off telemetry. Gastrointestinal system: Abdomen is nondistended, soft and nontender. No organomegaly or masses felt. Normal bowel sounds heard. Central nervous system: Alert and oriented. No focal neurological deficits. Extremities: Symmetric 5 x 5 power. Skin: No rashes, lesions or ulcers Psychiatry: Judgement and insight appear normal. Mood & affect appropriate. Tongue with some thrush on  the margins and red.?  Glossitis.    Data Reviewed:   I have personally reviewed following labs and imaging studies   CBC: Recent Labs  Lab 11/03/24 0636 11/04/24 0303 11/05/24 0404  WBC 12.2* 12.0* 14.0*  HGB 7.5* 7.2* 6.8*  HCT 24.9* 23.8* 22.9*  MCV 72.8* 73.0* 73.9*  PLT 569* 555* 663*    Basic Metabolic Panel: Recent Labs  Lab 11/01/24 0446  11/02/24 0616 11/03/24 0636 11/05/24 0404  NA 139 138 134*  --   K 3.5 3.4* 3.9  --   CL 100 99 98  --   CO2 32 29 26  --   GLUCOSE 160* 79 99  --   BUN 22 26* 24*  --   CREATININE 0.71 0.71 0.62 0.65  CALCIUM  8.1* 8.3* 8.0*  --   MG  --  1.3* 2.1  --     Liver Function Tests: Recent Labs  Lab 11/02/24 0616 11/03/24 0636  AST 75* 42*  ALT 82* 64*  ALKPHOS 115 108  BILITOT <0.2 <0.2  PROT 5.6* 5.5*  ALBUMIN 2.9* 2.8*    CBG: No results for input(s): GLUCAP in the last 168 hours.  Microbiology Studies:   Recent Results (from the past 240 hours)  Resp panel by RT-PCR (RSV, Flu A&B, Covid) Anterior Nasal Swab     Status: None   Collection Time: 10/29/24 12:04 AM   Specimen: Anterior Nasal Swab  Result Value Ref Range Status   SARS Coronavirus 2 by RT PCR NEGATIVE NEGATIVE Final    Comment: (NOTE) SARS-CoV-2 target nucleic acids are NOT DETECTED.  The SARS-CoV-2 RNA is generally detectable in upper respiratory specimens during the acute phase of infection. The lowest concentration of SARS-CoV-2 viral copies this assay can detect is 138 copies/mL. A negative result does not preclude SARS-Cov-2 infection and should not be used as the sole basis for treatment or other patient management decisions. A negative result may occur with  improper specimen collection/handling, submission of specimen other than nasopharyngeal swab, presence of viral mutation(s) within the areas targeted by this assay, and inadequate number of viral copies(<138 copies/mL). A negative result must be combined with clinical observations, patient history, and epidemiological information. The expected result is Negative.  Fact Sheet for Patients:  bloggercourse.com  Fact Sheet for Healthcare Providers:  seriousbroker.it  This test is no t yet approved or cleared by the [place]  FDA and  has been authorized for detection and/or diagnosis of  SARS-CoV-2 by FDA under an Emergency Use Authorization (EUA). This EUA will remain  in effect (meaning this test can be used) for the duration of the COVID-19 declaration under Section 564(b)(1) of the Act, 21 U.S.C.section 360bbb-3(b)(1), unless the authorization is terminated  or revoked sooner.       Influenza A by PCR NEGATIVE NEGATIVE Final   Influenza B by PCR NEGATIVE NEGATIVE Final    Comment: (NOTE) The Xpert Xpress SARS-CoV-2/FLU/RSV plus assay is intended as an aid in the diagnosis of influenza from Nasopharyngeal swab specimens and should not be used as a sole basis for treatment. Nasal washings and aspirates are unacceptable for Xpert Xpress SARS-CoV-2/FLU/RSV testing.  Fact Sheet for Patients: bloggercourse.com  Fact Sheet for Healthcare Providers: seriousbroker.it  This test is not yet approved or cleared by the [place]  FDA and has been authorized for detection and/or diagnosis of SARS-CoV-2 by FDA under an Emergency Use Authorization (EUA). This EUA will remain in effect (meaning this test can be used) for the  duration of the COVID-19 declaration under Section 564(b)(1) of the Act, 21 U.S.C. section 360bbb-3(b)(1), unless the authorization is terminated or revoked.     Resp Syncytial Virus by PCR NEGATIVE NEGATIVE Final    Comment: (NOTE) Fact Sheet for Patients: bloggercourse.com  Fact Sheet for Healthcare Providers: seriousbroker.it  This test is not yet approved or cleared by the [place]  FDA and has been authorized for detection and/or diagnosis of SARS-CoV-2 by FDA under an Emergency Use Authorization (EUA). This EUA will remain in effect (meaning this test can be used) for the duration of the COVID-19 declaration under Section 564(b)(1) of the Act, 21 U.S.C. section 360bbb-3(b)(1), unless the authorization is terminated  or revoked.  Performed at [facility], [address] [place], [place] [address]   Culture, blood (Routine X 2) w Reflex to ID Panel     Status: None   Collection Time: 10/29/24  6:50 AM   Specimen: BLOOD  Result Value Ref Range Status   Specimen Description BLOOD LEFT ANTECUBITAL  Final   Special Requests   Final    BOTTLES DRAWN AEROBIC AND ANAEROBIC Blood Culture adequate volume   Culture   Final    NO GROWTH 5 DAYS Performed at [facility], [address] [place], [place] [address]    Report Status 11/03/2024 FINAL  Final  Culture, blood (Routine X 2) w Reflex to ID Panel     Status: None   Collection Time: 10/29/24  6:52 AM   Specimen: BLOOD LEFT ARM  Result Value Ref Range Status   Specimen Description BLOOD LEFT ARM BLOOD  Final   Special Requests   Final    BOTTLES DRAWN AEROBIC AND ANAEROBIC Blood Culture adequate volume   Culture   Final    NO GROWTH 5 DAYS Performed at [facility], [address], [place], [place] [address]    Report Status 11/03/2024 FINAL  Final  Expectorated Sputum Assessment w Gram Stain, Rflx to Resp Cult     Status: None   Collection Time: 10/29/24 12:50 PM   Specimen: Sputum  Result Value Ref Range Status   Specimen Description SPU  Final   Special Requests NONE  Final   Sputum evaluation   Final    THIS SPECIMEN IS ACCEPTABLE FOR SPUTUM CULTURE Performed at [facility], [address] [place], [place] [address]    Report Status 10/29/2024 FINAL  Final  Culture, Respiratory w Gram Stain     Status: None   Collection Time: 10/29/24 12:50 PM   Specimen: Sputum  Result Value Ref Range Status   Specimen Description   Final    SPU Performed at [facility], [address], [place], [place] [address]    Special Requests   Final    NONE Reflexed from [id number] Performed at [facility], [address] [place], [place] [address]    Gram Stain   Final    NO WBC SEEN FEW GRAM POSITIVE COCCI RARE GRAM NEGATIVE RODS    Culture   Final     FEW Normal respiratory flora-no Staph aureus or Pseudomonas seen Performed at [facility], [address] [place], [place] [address]    Report Status 10/31/2024 FINAL  Final  Gram stain     Status: None   Collection Time: 10/30/24  9:45 AM   Specimen: Pleura  Result Value Ref Range Status   Specimen Description PLEURAL  Final   Special Requests NONE  Final   Gram Stain  Final    NO ORGANISMS SEEN WBC PRESENT,BOTH PMN AND MONONUCLEAR CYTOSPIN SMEAR Performed at [facility], [address] [place], [place] [address]    Report Status 10/30/2024 FINAL  Final  Culture, body fluid w Gram Stain-bottle     Status: None   Collection Time: 10/30/24  9:45 AM   Specimen: Pleura  Result Value Ref Range Status   Specimen Description PLEURAL BOTTLES DRAWN AEROBIC AND ANAEROBIC  Final   Special Requests 10CC  Final   Culture   Final    NO GROWTH 5 DAYS Performed at [facility], [address], [place], [place] [address]    Report Status 11/04/2024 FINAL  Final  Culture, body fluid w Gram Stain-bottle     Status: None (Preliminary result)   Collection Time: 11/02/24  9:55 AM   Specimen: Pleura  Result Value Ref Range Status   Specimen Description PLEURAL  Final   Special Requests NONE  Final   Culture   Final    NO GROWTH 3 DAYS Performed at [facility], [address], [place], [place] [address]    Report Status PENDING  Incomplete  Gram stain     Status: None   Collection Time: 11/02/24  9:55 AM   Specimen: Pleura  Result Value Ref Range Status   Specimen Description PLEURAL  Final   Special Requests NONE  Final   Gram Stain   Final    RARE WBC SEEN NO ORGANISMS SEEN Performed at [facility], [address] [place], [place] [address]    Report Status 11/02/2024 FINAL  Final    Radiology Studies:  DG Chest Port 1 View Result Date: 11/05/2024 CLINICAL DATA:  Pleural effusion. EXAM: PORTABLE CHEST 1 VIEW COMPARISON:  11/04/2024 and CT chest 10/29/2024.  FINDINGS: Heart size stable. Thoracic aorta is calcified. Small bore chest tube in the base of the left hemithorax with a loculated moderate left hydropneumothorax. The amount of pleural air appears to have decreased somewhat in the interval. Patchy airspace opacification in the perihilar left lung. Lungs are emphysematous. Right apical pleural thickening. Right lung is grossly clear. IMPRESSION: 1. Moderate left hydropneumothorax. Amount of pleural air appears to have decreased slightly in the interval. 2. Left upper left lower lobe pneumonia. 3.  Emphysema (ICD10-J43.9). Electronically Signed   By: Newell Eke M.D.   On: 11/05/2024 11:22   DG Chest Port 1 View Result Date: 11/04/2024 EXAM: 1 VIEW XRAY OF THE CHEST 11/04/2024 04:54:00 PM COMPARISON: 11/04/2024 CLINICAL HISTORY: Pneumothorax FINDINGS: LINES, TUBES AND DEVICES: Chest tube is unchanged, but appears coiled at the left lung base and kinked. Correlation for appropriate chest tube function is recommended. LUNGS AND PLEURA: Persistent small, inferolaterally loculated pneumothorax. Progressive consolidation within the left mid and lower lung zone. Diffuse interstitial coarsening are noted, likely related to underlying interstitial lung disease. No pneumothorax or pleural effusion on the right. HEART AND MEDIASTINUM: Cardiac size within normal limits. Atherosclerotic plaque. BONES AND SOFT TISSUES: No acute osseous abnormality. IMPRESSION: 1. Persistent small inferolaterally loculated left pneumothorax. 2. Left chest tube is unchanged but appears coiled at the left lung base and kinked, and assessment of chest tube function is recommended. 3. Progressive consolidation in the left mid and lower lung. 4. Chronic diffuse interstitial coarsening consistent with underlying interstitial lung disease. 5. Atherosclerotic plaque. Electronically signed by: Dorethia Molt MD MD 11/04/2024 08:17 PM EST RP Workstation: [id number]   DG CHEST PORT 1 VIEW Result  Date: 11/04/2024 EXAM: 1  VIEW XRAY OF THE CHEST 11/04/2024 06:18:00 AM COMPARISON: 11/03/2024 CLINICAL HISTORY: Pleural effusion FINDINGS: LINES, TUBES AND DEVICES: Left sided pleural pigtail catheter is unchanged in position. LUNGS AND PLEURA: Small volume inferolateral left sided pneumothorax is unchanged. There is likely small volume residual left pleural fluid as well. Similar left perihilar and less so infrahilar airspace disease. Moderate underlying interstitial thickening may relate to smoking / chronic bronchitis. HEART AND MEDIASTINUM: Mild cardiomegaly. BONES AND SOFT TISSUES: No acute osseous abnormality. IMPRESSION: 1. Left sided pleural pigtail catheter in place with similar inferolateral left pleural air and likely small volume pleural fluid. 2. Left sided airspace disease is similar, pneumonia or lymphangitic tumor spread based on prior CT. Electronically signed by: Rockey Kilts MD MD 11/04/2024 02:32 PM EST RP Workstation: [id number]    Scheduled Meds:    (feeding supplement) PROSource Plus  30 mL Oral BID BM   aspirin  EC  81 mg Oral Daily   clopidogrel   75 mg Oral Daily   dextromethorphan -guaiFENesin   1 tablet Oral BID   enoxaparin  (LOVENOX ) injection  40 mg Subcutaneous Q24H   feeding supplement  1 Container Oral TID BM   ferrous sulfate   325 mg Oral Q breakfast   ipratropium-albuterol   3 mL Nebulization BID   lisinopril   20 mg Oral Daily   mirabegron  ER  50 mg Oral Daily   multivitamin with minerals  1 tablet Oral Daily   pantoprazole   40 mg Oral BID   predniSONE   40 mg Oral Q breakfast   rosuvastatin   20 mg Oral Daily   sodium chloride  flush  10 mL Intrapleural Q8H   sodium chloride  flush  10-40 mL Intracatheter Q12H   thiamine   100 mg Oral Daily    Continuous Infusions:    cefTRIAXone  (ROCEPHIN )  IV 2 g (11/04/24 2146)     LOS: 7 days     Trenda Mar, MD,  FACP, [facility], [facility], [facility]   [facility] [place]   To contact the  attending provider between 7A-7P or the covering provider during after hours 7P-7A, please log into the web site [email or web address] and access using universal [place] password for that web site. If you do not have the password, please call the hospital operator.  11/05/2024, 3:09 PM   "

## 2024-11-05 NOTE — Progress Notes (Signed)
 L CT removed per MD order. Dressing applied. PCXR in AM.

## 2024-11-05 NOTE — Progress Notes (Signed)
 Called Lab because timed collect H/H has not been drawn yet and was ordered for 1250. VP stated they would be up to draw. Will continue to watch for draw/result.

## 2024-11-05 NOTE — Plan of Care (Signed)
  Problem: Clinical Measurements: Goal: Diagnostic test results will improve Outcome: Progressing Goal: Respiratory complications will improve Outcome: Progressing   Problem: Nutrition: Goal: Adequate nutrition will be maintained Outcome: Progressing   Problem: Coping: Goal: Level of anxiety will decrease Outcome: Progressing   

## 2024-11-06 ENCOUNTER — Inpatient Hospital Stay (HOSPITAL_COMMUNITY)

## 2024-11-06 ENCOUNTER — Telehealth: Payer: Self-pay | Admitting: Pulmonary Disease

## 2024-11-06 DIAGNOSIS — R7989 Other specified abnormal findings of blood chemistry: Secondary | ICD-10-CM

## 2024-11-06 LAB — BPAM RBC
Blood Product Expiration Date: 202602032359
ISSUE DATE / TIME: 202601110808
Unit Type and Rh: 5100

## 2024-11-06 LAB — HEPATITIS PANEL, ACUTE
HCV Ab: NONREACTIVE
Hep A IgM: NONREACTIVE
Hep B C IgM: NONREACTIVE
Hepatitis B Surface Ag: NONREACTIVE

## 2024-11-06 LAB — TYPE AND SCREEN
ABO/RH(D): O POS
Antibody Screen: NEGATIVE
Unit division: 0

## 2024-11-06 LAB — COMPREHENSIVE METABOLIC PANEL WITH GFR
ALT: 132 U/L — ABNORMAL HIGH (ref 0–44)
AST: 134 U/L — ABNORMAL HIGH (ref 15–41)
Albumin: 3.3 g/dL — ABNORMAL LOW (ref 3.5–5.0)
Alkaline Phosphatase: 119 U/L (ref 38–126)
Anion gap: 8 (ref 5–15)
BUN: 20 mg/dL (ref 8–23)
CO2: 30 mmol/L (ref 22–32)
Calcium: 8.7 mg/dL — ABNORMAL LOW (ref 8.9–10.3)
Chloride: 96 mmol/L — ABNORMAL LOW (ref 98–111)
Creatinine, Ser: 0.7 mg/dL (ref 0.44–1.00)
GFR, Estimated: >60 mL/min
Glucose, Bld: 90 mg/dL (ref 70–99)
Potassium: 5.1 mmol/L (ref 3.5–5.1)
Sodium: 133 mmol/L — ABNORMAL LOW (ref 135–145)
Total Bilirubin: <0.2 mg/dL (ref 0.0–1.2)
Total Protein: 5.9 g/dL — ABNORMAL LOW (ref 6.5–8.1)

## 2024-11-06 LAB — CBC
HCT: 30.6 % — ABNORMAL LOW (ref 36.0–46.0)
Hemoglobin: 9.5 g/dL — ABNORMAL LOW (ref 12.0–15.0)
MCH: 23.6 pg — ABNORMAL LOW (ref 26.0–34.0)
MCHC: 31 g/dL (ref 30.0–36.0)
MCV: 75.9 fL — ABNORMAL LOW (ref 80.0–100.0)
Platelets: 641 K/uL — ABNORMAL HIGH (ref 150–400)
RBC: 4.03 MIL/uL (ref 3.87–5.11)
RDW: 20.7 % — ABNORMAL HIGH (ref 11.5–15.5)
WBC: 14.3 K/uL — ABNORMAL HIGH (ref 4.0–10.5)
nRBC: 0 % (ref 0.0–0.2)

## 2024-11-06 LAB — MISC LABCORP TEST (SEND OUT): Labcorp test code: 9985

## 2024-11-06 LAB — CYTOLOGY - NON PAP

## 2024-11-06 MED ORDER — HYDRALAZINE HCL 20 MG/ML IJ SOLN
10.0000 mg | Freq: Four times a day (QID) | INTRAMUSCULAR | Status: DC | PRN
  Administered 2024-11-06: 10 mg via INTRAVENOUS
  Filled 2024-11-06: qty 1

## 2024-11-06 NOTE — Plan of Care (Signed)

## 2024-11-06 NOTE — Plan of Care (Signed)
 " Problem: Education: Goal: Knowledge of General Education information will improve Description: Including pain rating scale, medication(s)/side effects and non-pharmacologic comfort measures 11/06/2024 1721 by Oneita Rhoda Aquas, RN Outcome: Progressing 11/06/2024 1622 by Oneita Rhoda Aquas, RN Outcome: Progressing   Problem: Health Behavior/Discharge Planning: Goal: Ability to manage health-related needs will improve 11/06/2024 1721 by Oneita Rhoda Aquas, RN Outcome: Progressing 11/06/2024 1622 by Oneita Rhoda Aquas, RN Outcome: Progressing   Problem: Clinical Measurements: Goal: Ability to maintain clinical measurements within normal limits will improve 11/06/2024 1721 by Oneita Rhoda Aquas, RN Outcome: Progressing 11/06/2024 1622 by Oneita Rhoda Aquas, RN Outcome: Progressing Goal: Will remain free from infection 11/06/2024 1721 by Oneita Rhoda Aquas, RN Outcome: Progressing 11/06/2024 1622 by Oneita Rhoda Aquas, RN Outcome: Progressing Goal: Diagnostic test results will improve 11/06/2024 1721 by Oneita Rhoda Aquas, RN Outcome: Progressing 11/06/2024 1622 by Oneita Rhoda Aquas, RN Outcome: Progressing Goal: Respiratory complications will improve 11/06/2024 1721 by Oneita Rhoda Aquas, RN Outcome: Progressing 11/06/2024 1622 by Oneita Rhoda Aquas, RN Outcome: Progressing Goal: Cardiovascular complication will be avoided 11/06/2024 1721 by Oneita Rhoda Aquas, RN Outcome: Progressing 11/06/2024 1622 by Oneita Rhoda Aquas, RN Outcome: Progressing   Problem: Activity: Goal: Risk for activity intolerance will decrease 11/06/2024 1721 by Oneita Rhoda Aquas, RN Outcome: Progressing 11/06/2024 1622 by Oneita Rhoda Aquas, RN Outcome: Progressing   Problem: Nutrition: Goal: Adequate nutrition will be maintained 11/06/2024 1721 by Oneita Rhoda Aquas, RN Outcome: Progressing 11/06/2024 1622  by Oneita Rhoda Aquas, RN Outcome: Progressing   Problem: Coping: Goal: Level of anxiety will decrease 11/06/2024 1721 by Oneita Rhoda Aquas, RN Outcome: Progressing 11/06/2024 1622 by Oneita Rhoda Aquas, RN Outcome: Progressing   Problem: Elimination: Goal: Will not experience complications related to bowel motility 11/06/2024 1721 by Oneita Rhoda Aquas, RN Outcome: Progressing 11/06/2024 1622 by Oneita Rhoda Aquas, RN Outcome: Progressing Goal: Will not experience complications related to urinary retention 11/06/2024 1721 by Oneita Rhoda Aquas, RN Outcome: Progressing 11/06/2024 1622 by Oneita Rhoda Aquas, RN Outcome: Progressing   Problem: Pain Managment: Goal: General experience of comfort will improve and/or be controlled 11/06/2024 1721 by Oneita Rhoda Aquas, RN Outcome: Progressing 11/06/2024 1622 by Oneita Rhoda Aquas, RN Outcome: Progressing   Problem: Safety: Goal: Ability to remain free from injury will improve 11/06/2024 1721 by Oneita Rhoda Aquas, RN Outcome: Progressing 11/06/2024 1622 by Oneita Rhoda Aquas, RN Outcome: Progressing   Problem: Skin Integrity: Goal: Risk for impaired skin integrity will decrease 11/06/2024 1721 by Oneita Rhoda Aquas, RN Outcome: Progressing 11/06/2024 1622 by Oneita Rhoda Aquas, RN Outcome: Progressing   Problem: Education: Goal: Knowledge of disease or condition will improve 11/06/2024 1721 by Oneita Rhoda Aquas, RN Outcome: Progressing 11/06/2024 1622 by Oneita Rhoda Aquas, RN Outcome: Progressing Goal: Knowledge of secondary prevention will improve (MUST DOCUMENT ALL) 11/06/2024 1721 by Oneita Rhoda Aquas, RN Outcome: Progressing 11/06/2024 1622 by Oneita Rhoda Aquas, RN Outcome: Progressing Goal: Knowledge of patient specific risk factors will improve (DELETE if not current risk factor) 11/06/2024 1721 by Oneita Rhoda Aquas, RN Outcome: Progressing 11/06/2024 1622 by Oneita Rhoda Aquas, RN Outcome: Progressing   Problem: Ischemic Stroke/TIA Tissue Perfusion: Goal: Complications of ischemic stroke/TIA will be minimized 11/06/2024 1721 by Oneita Rhoda Aquas, RN Outcome: Progressing 11/06/2024 1622 by Oneita Rhoda Aquas, RN Outcome: Progressing   Problem: Coping: Goal: Will verbalize positive feelings about self 11/06/2024 1721 by Oneita Rhoda Aquas, RN Outcome: Progressing 11/06/2024 1622 by Oneita Rhoda Aquas, RN Outcome: Progressing Goal: Will identify appropriate support needs  11/06/2024 1721 by Oneita Rhoda Aquas, RN Outcome: Progressing 11/06/2024 1622 by Oneita Rhoda Aquas, RN Outcome: Progressing   Problem: Health Behavior/Discharge Planning: Goal: Ability to manage health-related needs will improve 11/06/2024 1721 by Oneita Rhoda Aquas, RN Outcome: Progressing 11/06/2024 1622 by Oneita Rhoda Aquas, RN Outcome: Progressing Goal: Goals will be collaboratively established with patient/family 11/06/2024 1721 by Oneita Rhoda Aquas, RN Outcome: Progressing 11/06/2024 1622 by Oneita Rhoda Aquas, RN Outcome: Progressing   Problem: Self-Care: Goal: Ability to participate in self-care as condition permits will improve 11/06/2024 1721 by Oneita Rhoda Aquas, RN Outcome: Progressing 11/06/2024 1622 by Oneita Rhoda Aquas, RN Outcome: Progressing Goal: Verbalization of feelings and concerns over difficulty with self-care will improve 11/06/2024 1721 by Oneita Rhoda Aquas, RN Outcome: Progressing 11/06/2024 1622 by Oneita Rhoda Aquas, RN Outcome: Progressing Goal: Ability to communicate needs accurately will improve 11/06/2024 1721 by Oneita Rhoda Aquas, RN Outcome: Progressing 11/06/2024 1622 by Oneita Rhoda Aquas, RN Outcome: Progressing   Problem: Nutrition: Goal: Risk of aspiration will  decrease 11/06/2024 1721 by Oneita Rhoda Aquas, RN Outcome: Progressing 11/06/2024 1622 by Oneita Rhoda Aquas, RN Outcome: Progressing Goal: Dietary intake will improve 11/06/2024 1721 by Oneita Rhoda Aquas, RN Outcome: Progressing 11/06/2024 1622 by Oneita Rhoda Aquas, RN Outcome: Progressing   "

## 2024-11-06 NOTE — Progress Notes (Addendum)
 " PROGRESS NOTE   Brandi Mcmahon  FMW:[id number]    DOB: Apr 27, 1941    DOA: 10/28/2024  PCP: Mavis Coy   I have briefly reviewed patients previous medical records in [facility].   Brief Hospital Course:  84 year old female lives with son and daughter-in-law, ambulates with the help of a walker, medical history significant for tobacco abuse, recently quit in September 2025, chronic respiratory failure with hypoxia on 2 L/min [place] oxygen, monoclonal gammopathy, chronic microcytic anemia, CAD, PAD s/p bilateral common iliac artery stent on Plavix , HTN, HLD, GERD, initially presented to [facility] with complaints of 1 week history of productive cough and chest congestion.  She also had an episode of aphasia, facial droop and slurred speech witnessed by her family prior to coming to the ED, episode lasting 10 minutes.  At [facility], sustained tachypnea up to 27/min, transient tachycardia, but no hypoxia documented.  Chest x-ray showed consolidation on the left side along with pleural effusion.  CT chest showed large loculated left pleural effusion and concern for malignant pleural disease such as pleural metastasis or mesothelioma.  S/p 125 mL ultrasound-guided thoracentesis, exudative fluid, cytology pending, case was discussed by TRH with PCCM on 1/7 and patient was transferred to [facility] for further evaluation and management.  Case had also been discussed with neurology for TIA who recommended DAPT and outpatient vascular surgery consultation for critical left ICA stenosis.  At [facility], PCCM consulted, left-sided chest tube placed. Vascular surgery consulted for symptomatic left ICA critical stenosis, TCAR recommended but patient currently not interested, vascular surgery plans to closely follow-up outpatient for same.  Chest tube removed 1/11.  Possible DC home 1/13.   Assessment & Plan:   Community-acquired pneumonia Large loculated exudative left parapneumonic pleural effusion Completed azithromycin  x 5 days.   IV ceftriaxone  1/3 >, completed 9 days of IV ceftriaxone , see no need for continuation, discontinued all antibiotics 1/12. Blood cultures x 2 negative.   PCCM consulted.  Placed left-sided chest tube and gave lytics for loculation.  Pleural effusion has almost completely drained.  Has hydropneumothorax/pneumothorax ex vacuo due to lack of lung expansion.  Chest tube was removed at 1/11.  As per PCCM, lung will not expand, that space will fill back up with fluid eventually and eventually formed fibrothorax.  Patient has been counseled extensively to seek immediate medical attention if he has worsening dyspnea. 1/11 Chest x-ray continues to show moderate left hydropneumothorax but smaller than prior. Pleural fluid culture 1/4 negative, 1/8: Negative to date.  Pleural fluid cytology, 1/4 & 1/8 showed no malignant cells Discussed with Dr. Annella, PCCM on day of discharge: Recommends Breztri  2 puffs twice daily Breztri  2 puffs twice daily with instruction to rinse mouth after every use, recommends discharging with albuterol  nebulizer and new nebulizer machine for which TOC has been consulted and he will arrange outpatient pulmonology follow-up in 2 to 4 weeks in [place].  Chronic respiratory failure with hypoxia COPD exacerbation Stable on 2 L/min [place] oxygen, continue Transitioned IV Solu-Medrol  to prednisone  40 mg daily from 1/11.  Has been on steroids now since 1/4.  See no further indication, discontinued prednisone . Continue bronchodilators.  Abnormal LFTs Patient had normal AST ALT on arrival.  Mildly elevated for the last couple days.  No GI symptoms reported.  Abdominal exam benign. Acute hepatitis panel negative. Follow-up RUQ ultrasound. ?  Related to antibiotics.  Follow CMP in a.m. and closely as outpatient.cbc   Tobacco use disorder Quit September 2025.  Congratulated and encouraged  continued cessation  TIA Suspected secondary to critical left ICA stenosis No recurrence of  symptoms since hospital admission. CT head 1/4: No acute intracranial abnormality.  CTA head and neck 1/4: Critical stenosis (>90%) of the left ICA origin, less than 30% stenosis of proximal right ICA and less than 20% stenosis of the carotid siphons.  Ulcerative plaque and moderate calcific plaque in the distal aortic arch, which can be a potential embolic source. MRI brain 1/4: No acute intracranial abnormality. LDL 32.  A1c 5.5.  TTE 1/4: LVEF 60-65%. Therapies recommend home health PT.  No OT follow-up. Currently on aspirin  81 mg daily + Plavix  75 mg daily (held on 1/8 for lytics) through left chest tube and rosuvastatin  20 mg daily.  Discussed with Dr. Annella, PCCM, no plans for further lytics and okay to resume Plavix , ordered. Vascular surgery consultation and follow-up appreciated.  TCAR recommended but patient not ready at this time.  VVS plans to follow outpatient and arrange same.  Patient advised regarding increased stroke risk.  She is aware. 1/9: Discussed in detail with Dr.Gaurang Palikh, Neurohospitalist and was advised that there was no need for a formal consultation.  Plan and antiplatelet management per vascular surgery.  Microcytic anemia Thrombocytosis Hemoglobin this admission is stable in the low 7 g range.  However anemia is worse since 2023 Anemia panel: Iron 21, saturation ratio 7, TIBC 329, ferritin 143, folate 9.8 and B12: 922.  Started iron supplements. 1/11, hemoglobin down to 6.8, s/p 1 unit PRBC, hemoglobin up to 9.6 > 9.5.  No overt bleeding.  Follow CBC in a.m. and as outpatient periodically.  Outpatient further evaluation and management as per PCP.  Hypokalemia Hypomagnesemia Replaced.  GERD PPI  HLD LDL at goal.  Continue statins  Essential hypertension Mildly uncontrolled.  Continue current meds.  Added as needed IV hydralazine .  Monoclonal gammopathy Outpatient hematology follow-up.  1 mm ACA aneurysm Noted on CTA head and neck 1/4. As per  radiology recommendation, follow-up CTA head in 1 to 2 years to ensure stability  Anxiety disorder Resumed home dose of as needed Valium .  Right sided sciatica/chronic low back pain Acetaminophen , lidocaine  patch, out of bed to chair.  Monitor.  Oral thrush Started clotrimazole , continue Magic mouthwash.  Better.  Nutrition Status: Nutrition Problem: Severe Malnutrition Etiology: acute illness Signs/Symptoms: energy intake < or equal to 50% for > or equal to 5 days, severe muscle depletion, severe fat depletion Interventions: Refer to RD note for recommendations Agree with registered dietitian evaluation and management, continue management as per their recommendations.  Body mass index is 22.79 kg/m.   DVT prophylaxis: enoxaparin  (LOVENOX ) injection 40 mg Start: 10/29/24 1000     Code Status: Limited: Do not attempt resuscitation (DNR) -DNR-LIMITED -Do Not Intubate/DNI :  Family Communication: None at bedside. Disposition:  Status is: Inpatient Pending improvement or stability in his LFTs, RUQ results, possible DC home tomorrow.  Patient aware.     Consultants:   PCCM Vascular surgery  Procedures:   As above  Subjective:  Tongue soreness is better.  No dyspnea or chest pain.  Denies abdominal pain, nausea or vomiting.  Appetite picking up.  BMs yesterday.  Objective:   Vitals:   11/05/24 2242 11/06/24 0518 11/06/24 1004 11/06/24 1305  BP: (!) 142/71 (!) 186/86 (!) 144/63 (!) 186/90  Pulse: 86 81 75 96  Resp: 16 17 18 20   Temp: (!) 97.5 F (36.4 C) (!) 97.5 F (36.4 C) 98.4 F (36.9 C) 97.9 F (  36.6 C)  TempSrc:   Oral Oral  SpO2: 98% 95% 95% 96%  Weight:      Height:        General exam: Elderly female, small built and frail, chronically ill looking lying comfortably propped up in bed without distress.  On 2 L/min [place] oxygen. Respiratory system: Clear to auscultation right lung fields.  Improved breath sounds left lung fields.  Still slightly diminished in  the left base.  Chest tube has been removed. Cardiovascular system: S1 & S2 heard, RRR. No JVD, murmurs, rubs, gallops or clicks. No pedal edema.  Off telemetry. Gastrointestinal system: Abdomen is nondistended, soft and nontender. No organomegaly or masses felt. Normal bowel sounds heard.  Unremarkable abdominal exam. Central nervous system: Alert and oriented. No focal neurological deficits. Extremities: Symmetric 5 x 5 power. Skin: No rashes, lesions or ulcers Psychiatry: Judgement and insight appear normal. Mood & affect appropriate. Tongue with some thrush on the margins and red.?  Glossitis.  Slightly better.    Data Reviewed:   I have personally reviewed following labs and imaging studies   CBC: Recent Labs  Lab 11/04/24 0303 11/05/24 0404 11/05/24 1230 11/06/24 0505  WBC 12.0* 14.0*  --  14.3*  HGB 7.2* 6.8* 9.6* 9.5*  HCT 23.8* 22.9* 30.5* 30.6*  MCV 73.0* 73.9*  --  75.9*  PLT 555* 663*  --  641*    Basic Metabolic Panel: Recent Labs  Lab 11/01/24 0446 11/02/24 0616 11/03/24 0636 11/05/24 0404 11/06/24 0505  NA 139 138 134*  --  133*  K 3.5 3.4* 3.9  --  5.1  CL 100 99 98  --  96*  CO2 32 29 26  --  30  GLUCOSE 160* 79 99  --  90  BUN 22 26* 24*  --  20  CREATININE 0.71 0.71 0.62 0.65 0.70  CALCIUM  8.1* 8.3* 8.0*  --  8.7*  MG  --  1.3* 2.1  --   --     Liver Function Tests: Recent Labs  Lab 11/02/24 0616 11/03/24 0636 11/06/24 0505  AST 75* 42* 134*  ALT 82* 64* 132*  ALKPHOS 115 108 119  BILITOT <0.2 <0.2 <0.2  PROT 5.6* 5.5* 5.9*  ALBUMIN 2.9* 2.8* 3.3*    CBG: No results for input(s): GLUCAP in the last 168 hours.  Microbiology Studies:   Recent Results (from the past 240 hours)  Resp panel by RT-PCR (RSV, Flu A&B, Covid) Anterior Nasal Swab     Status: None   Collection Time: 10/29/24 12:04 AM   Specimen: Anterior Nasal Swab  Result Value Ref Range Status   SARS Coronavirus 2 by RT PCR NEGATIVE NEGATIVE Final    Comment:  (NOTE) SARS-CoV-2 target nucleic acids are NOT DETECTED.  The SARS-CoV-2 RNA is generally detectable in upper respiratory specimens during the acute phase of infection. The lowest concentration of SARS-CoV-2 viral copies this assay can detect is 138 copies/mL. A negative result does not preclude SARS-Cov-2 infection and should not be used as the sole basis for treatment or other patient management decisions. A negative result may occur with  improper specimen collection/handling, submission of specimen other than nasopharyngeal swab, presence of viral mutation(s) within the areas targeted by this assay, and inadequate number of viral copies(<138 copies/mL). A negative result must be combined with clinical observations, patient history, and epidemiological information. The expected result is Negative.  Fact Sheet for Patients:  bloggercourse.com  Fact Sheet for Healthcare Providers:  seriousbroker.it  This  test is no t yet approved or cleared by the [place]  FDA and  has been authorized for detection and/or diagnosis of SARS-CoV-2 by FDA under an Emergency Use Authorization (EUA). This EUA will remain  in effect (meaning this test can be used) for the duration of the COVID-19 declaration under Section 564(b)(1) of the Act, 21 U.S.C.section 360bbb-3(b)(1), unless the authorization is terminated  or revoked sooner.       Influenza A by PCR NEGATIVE NEGATIVE Final   Influenza B by PCR NEGATIVE NEGATIVE Final    Comment: (NOTE) The Xpert Xpress SARS-CoV-2/FLU/RSV plus assay is intended as an aid in the diagnosis of influenza from Nasopharyngeal swab specimens and should not be used as a sole basis for treatment. Nasal washings and aspirates are unacceptable for Xpert Xpress SARS-CoV-2/FLU/RSV testing.  Fact Sheet for Patients: bloggercourse.com  Fact Sheet for Healthcare  Providers: seriousbroker.it  This test is not yet approved or cleared by the [place]  FDA and has been authorized for detection and/or diagnosis of SARS-CoV-2 by FDA under an Emergency Use Authorization (EUA). This EUA will remain in effect (meaning this test can be used) for the duration of the COVID-19 declaration under Section 564(b)(1) of the Act, 21 U.S.C. section 360bbb-3(b)(1), unless the authorization is terminated or revoked.     Resp Syncytial Virus by PCR NEGATIVE NEGATIVE Final    Comment: (NOTE) Fact Sheet for Patients: bloggercourse.com  Fact Sheet for Healthcare Providers: seriousbroker.it  This test is not yet approved or cleared by the [place]  FDA and has been authorized for detection and/or diagnosis of SARS-CoV-2 by FDA under an Emergency Use Authorization (EUA). This EUA will remain in effect (meaning this test can be used) for the duration of the COVID-19 declaration under Section 564(b)(1) of the Act, 21 U.S.C. section 360bbb-3(b)(1), unless the authorization is terminated or revoked.  Performed at [facility], [address] [place], [place] [address]   Culture, blood (Routine X 2) w Reflex to ID Panel     Status: None   Collection Time: 10/29/24  6:50 AM   Specimen: BLOOD  Result Value Ref Range Status   Specimen Description BLOOD LEFT ANTECUBITAL  Final   Special Requests   Final    BOTTLES DRAWN AEROBIC AND ANAEROBIC Blood Culture adequate volume   Culture   Final    NO GROWTH 5 DAYS Performed at [facility], [address] [place], [place] [address]    Report Status 11/03/2024 FINAL  Final  Culture, blood (Routine X 2) w Reflex to ID Panel     Status: None   Collection Time: 10/29/24  6:52 AM   Specimen: BLOOD LEFT ARM  Result Value Ref Range Status   Specimen Description BLOOD LEFT ARM BLOOD  Final   Special Requests   Final    BOTTLES DRAWN AEROBIC AND  ANAEROBIC Blood Culture adequate volume   Culture   Final    NO GROWTH 5 DAYS Performed at [facility], [address], [place], [place] [address]    Report Status 11/03/2024 FINAL  Final  Expectorated Sputum Assessment w Gram Stain, Rflx to Resp Cult     Status: None   Collection Time: 10/29/24 12:50 PM   Specimen: Sputum  Result Value Ref Range Status   Specimen Description SPU  Final   Special Requests NONE  Final   Sputum evaluation   Final    THIS SPECIMEN IS ACCEPTABLE FOR SPUTUM CULTURE Performed at [facility], [address]  [place], [place] [address]    Report Status 10/29/2024 FINAL  Final  Culture, Respiratory w Gram Stain     Status: None   Collection Time: 10/29/24 12:50 PM   Specimen: Sputum  Result Value Ref Range Status   Specimen Description   Final    SPU Performed at [facility], [address] [place], [place] [address]    Special Requests   Final    NONE Reflexed from [id number] Performed at [facility], [address], [place], [place] [address]    Gram Stain   Final    NO WBC SEEN FEW GRAM POSITIVE COCCI RARE GRAM NEGATIVE RODS    Culture   Final    FEW Normal respiratory flora-no Staph aureus or Pseudomonas seen Performed at [facility], [address], [place], [place] [address]    Report Status 10/31/2024 FINAL  Final  Gram stain     Status: None   Collection Time: 10/30/24  9:45 AM   Specimen: Pleura  Result Value Ref Range Status   Specimen Description PLEURAL  Final   Special Requests NONE  Final   Gram Stain   Final    NO ORGANISMS SEEN WBC PRESENT,BOTH PMN AND MONONUCLEAR CYTOSPIN SMEAR Performed at [facility], [address], [place], [place] [address]    Report Status 10/30/2024 FINAL  Final  Culture, body fluid w Gram Stain-bottle     Status: None   Collection Time: 10/30/24  9:45 AM   Specimen: Pleura  Result Value Ref Range Status   Specimen Description PLEURAL BOTTLES DRAWN AEROBIC AND ANAEROBIC  Final   Special  Requests 10CC  Final   Culture   Final    NO GROWTH 5 DAYS Performed at [facility], [address], [place], [place] [address]    Report Status 11/04/2024 FINAL  Final  Culture, body fluid w Gram Stain-bottle     Status: None (Preliminary result)   Collection Time: 11/02/24  9:55 AM   Specimen: Pleura  Result Value Ref Range Status   Specimen Description PLEURAL  Final   Special Requests NONE  Final   Culture   Final    NO GROWTH 4 DAYS Performed at [facility], [address], [place], [place] [address]    Report Status PENDING  Incomplete  Gram stain     Status: None   Collection Time: 11/02/24  9:55 AM   Specimen: Pleura  Result Value Ref Range Status   Specimen Description PLEURAL  Final   Special Requests NONE  Final   Gram Stain   Final    RARE WBC SEEN NO ORGANISMS SEEN Performed at [facility], [address] [place], [place] [address]    Report Status 11/02/2024 FINAL  Final    Radiology Studies:  DG CHEST PORT 1 VIEW Result Date: 11/06/2024 CLINICAL DATA:  Encounter for chest tube placement. EXAM: PORTABLE CHEST 1 VIEW COMPARISON:  11/05/2024 FINDINGS: Left chest tube has been removed. Persistent lucency along the lateral aspect of the mid and lower left lung most likely represents a loculated pneumothorax. Findings could be associated with a hydropneumothorax and similar to the chest radiograph on 11/05/2024. Again noted are prominent interstitial lung markings suggestive chronic changes in both lungs. Heart size is grossly stable. Atherosclerotic calcifications at the aortic arch. IMPRESSION: 1. Removal of left chest tube. Persistent lucency along the lateral aspect of the left lung most likely represents a loculated pneumothorax. Findings could be associated with  a hydropneumothorax. 2. Chronic lung changes. Electronically Signed   By: Juliene Balder M.D.   On: 11/06/2024 08:47   DG Chest Port 1 View Result Date: 11/05/2024 CLINICAL DATA:  Pleural  effusion. EXAM: PORTABLE CHEST 1 VIEW COMPARISON:  11/04/2024 and CT chest 10/29/2024. FINDINGS: Heart size stable. Thoracic aorta is calcified. Small bore chest tube in the base of the left hemithorax with a loculated moderate left hydropneumothorax. The amount of pleural air appears to have decreased somewhat in the interval. Patchy airspace opacification in the perihilar left lung. Lungs are emphysematous. Right apical pleural thickening. Right lung is grossly clear. IMPRESSION: 1. Moderate left hydropneumothorax. Amount of pleural air appears to have decreased slightly in the interval. 2. Left upper left lower lobe pneumonia. 3.  Emphysema (ICD10-J43.9). Electronically Signed   By: Newell Eke M.D.   On: 11/05/2024 11:22   DG Chest Port 1 View Result Date: 11/04/2024 EXAM: 1 VIEW XRAY OF THE CHEST 11/04/2024 04:54:00 PM COMPARISON: 11/04/2024 CLINICAL HISTORY: Pneumothorax FINDINGS: LINES, TUBES AND DEVICES: Chest tube is unchanged, but appears coiled at the left lung base and kinked. Correlation for appropriate chest tube function is recommended. LUNGS AND PLEURA: Persistent small, inferolaterally loculated pneumothorax. Progressive consolidation within the left mid and lower lung zone. Diffuse interstitial coarsening are noted, likely related to underlying interstitial lung disease. No pneumothorax or pleural effusion on the right. HEART AND MEDIASTINUM: Cardiac size within normal limits. Atherosclerotic plaque. BONES AND SOFT TISSUES: No acute osseous abnormality. IMPRESSION: 1. Persistent small inferolaterally loculated left pneumothorax. 2. Left chest tube is unchanged but appears coiled at the left lung base and kinked, and assessment of chest tube function is recommended. 3. Progressive consolidation in the left mid and lower lung. 4. Chronic diffuse interstitial coarsening consistent with underlying interstitial lung disease. 5. Atherosclerotic plaque. Electronically signed by: Dorethia Molt MD MD  11/04/2024 08:17 PM EST RP Workstation: [id number]    Scheduled Meds:    (feeding supplement) PROSource Plus  30 mL Oral BID BM   aspirin  EC  81 mg Oral Daily   clopidogrel   75 mg Oral Daily   clotrimazole   10 mg Oral 5 X Daily   dextromethorphan -guaiFENesin   1 tablet Oral BID   enoxaparin  (LOVENOX ) injection  40 mg Subcutaneous Q24H   feeding supplement  1 Container Oral TID BM   ferrous sulfate   325 mg Oral Q breakfast   ipratropium-albuterol   3 mL Nebulization BID   lidocaine   1 patch Transdermal Q24H   lisinopril   20 mg Oral Daily   mirabegron  ER  50 mg Oral Daily   multivitamin with minerals  1 tablet Oral Daily   pantoprazole   40 mg Oral BID   predniSONE   40 mg Oral Q breakfast   sodium chloride  flush  10 mL Intrapleural Q8H   sodium chloride  flush  10-40 mL Intracatheter Q12H   thiamine   100 mg Oral Daily    Continuous Infusions:    cefTRIAXone  (ROCEPHIN )  IV 2 g (11/05/24 2125)     LOS: 8 days     Trenda Mar, MD,  FACP, [facility], [facility], [facility]   [facility] [place]   To contact the attending provider between 7A-7P or the covering provider during after hours 7P-7A, please log into the web site [email or web address] and access using universal [place] password for that web site. If you do not have the password, please call the hospital operator.  11/06/2024, 2:09 PM   "

## 2024-11-06 NOTE — Progress Notes (Signed)
 Discussed with attending physician.  Symptoms stable.  With chest tube removed.  Recommend discharge on Breztri  2 puffs twice a day every day with instruction to rinse mouth after every use.  Recommend discharge with albuterol  nebulizer and new nebulizer machine.  TOC consult placed.  Will request outpatient follow-up in  at patient request.

## 2024-11-06 NOTE — Progress Notes (Signed)
 Physical Therapy Treatment Patient Details Name: Brandi Mcmahon MRN: [id number] DOB: Oct 23, 1941 Today's Date: 11/06/2024   History of Present Illness Brandi Mcmahon is a 84 y.o. 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       This evening, as her daughter-in-law was preparing to bring her to the ER the patient was noted to be aphasic with facial droop and slurred speech witnessed by the patient's son and daughter-in-law.  EMS was activated.  This episode lasted about 10 minutes then resolved spontaneously.    PT Comments  Continuing work on functional mobility and activity tolerance;  Session focused on progressive amb awith rollator, with good progress; able to manage household distances with Rollator with min assist initially, progressing to CGA; showing good self-monitor for activity tolerance, and good use of rollator when she needs to sit down; Rec dc home with HHPT follow up   If plan is discharge home, recommend the following: A little help with walking and/or transfers;A little help with bathing/dressing/bathroom;Help with stairs or ramp for entrance;Assist for transportation;Assistance with cooking/housework   Can travel by private vehicle        Equipment Recommendations  None recommended by PT (has a rollator)    Recommendations for Other Services       Precautions / Restrictions Precautions Precautions: Fall Recall of Precautions/Restrictions: Intact Restrictions Weight Bearing Restrictions Per Provider Order: No     Mobility  Bed  Mobility Overal bed mobility: Needs Assistance Bed Mobility: Supine to Sit     Supine to sit: Supervision, HOB elevated, Used rails     General bed mobility comments: Smooth transition to sitting    Transfers Overall transfer level: Needs assistance Equipment used: Rollator (4 wheels) Transfers: Sit to/from Stand Sit to Stand: Contact guard assist           General transfer comment: Stood from bed, as well as to/from rollator (wheels locked)    Ambulation/Gait Ambulation/Gait assistance: Min assist, Clinical Research Associate (Feet): 60 Feet Assistive device: Rollator (4 wheels) Gait Pattern/deviations: Step-through pattern       General Gait Details: Walked on 2 L supplemental O2, cues to self-monitor for activity tolerance; One seated rest break (used rollator)   Stairs             Wheelchair Mobility     Tilt Bed    Modified Rankin (Stroke Patients Only)       Balance Overall balance assessment: Needs assistance Sitting-balance support: Feet supported, No upper extremity supported Sitting balance-Leahy Scale: Good       Standing balance-Leahy Scale: Fair                              Hotel Manager: No apparent difficulties  Cognition Arousal: Alert Behavior During Therapy: WFL for tasks assessed/performed                             Following commands: Intact      Cueing Cueing Techniques: Verbal cues, Tactile cues  Exercises  General Comments General comments (skin integrity, edema, etc.): Supoprted pt on 2 L supplemetnal O2 during amb      Pertinent Vitals/Pain Pain Assessment Pain Assessment: No/denies pain    Home Living Family/patient expects to be discharged to:: Private residence       Home Access: Stairs to enter                Prior Function            PT Goals (current goals can now be found in the care plan section) Acute Rehab PT  Goals Patient Stated Goal: return home with family to assist PT Goal Formulation: With patient Time For Goal Achievement: 11/03/24 Potential to Achieve Goals: Good Progress towards PT goals: Progressing toward goals    Frequency    Min 3X/week      PT Plan      Co-evaluation              AM-PAC PT 6 Clicks Mobility   Outcome Measure  Help needed turning from your back to your side while in a flat bed without using bedrails?: None Help needed moving from lying on your back to sitting on the side of a flat bed without using bedrails?: A Little Help needed moving to and from a bed to a chair (including a wheelchair)?: A Little Help needed standing up from a chair using your arms (e.g., wheelchair or bedside chair)?: A Little Help needed to walk in hospital room?: A Little Help needed climbing 3-5 steps with a railing? : A Little 6 Click Score: 19    End of Session Equipment Utilized During Treatment: Oxygen Activity Tolerance: Patient tolerated treatment well Patient left: with call bell/phone within reach (On [facility]) Nurse Communication: Mobility status PT Visit Diagnosis: Unsteadiness on feet (R26.81);Other abnormalities of gait and mobility (R26.89);Muscle weakness (generalized) (M62.81)     Time: 8664-8591 PT Time Calculation (min) (ACUTE ONLY): 33 min  Charges:    $Gait Training: 23-37 mins PT General Charges $$ ACUTE PT VISIT: 1 Visit                     Silvano Currier, PT  Acute Rehabilitation Services Office [phone] Secure Chat welcomed    Silvano VEAR Currier 11/06/2024, 2:59 PM

## 2024-11-06 NOTE — Telephone Encounter (Signed)
 Please schedule hospital follow-up in [place] with Dr. Catherine in 2-4 weeks. Thanks!

## 2024-11-06 NOTE — Telephone Encounter (Signed)
 Dr.  Alghanim currently does not have any 30 minute slots in February open for a hospital follow up. Messaging him to see if it is ok to schedule in a 15 minute slot, otherwise, next available will be in March.   Dr. Catherine, Is it ok to schedule this patient in a 15 minute slot for hospital f/u?  Otherwise, next available will be in March.  Please advise.  Thank you.

## 2024-11-07 ENCOUNTER — Other Ambulatory Visit: Payer: Self-pay | Admitting: Gastroenterology

## 2024-11-07 ENCOUNTER — Other Ambulatory Visit (HOSPITAL_COMMUNITY): Payer: Self-pay

## 2024-11-07 DIAGNOSIS — J918 Pleural effusion in other conditions classified elsewhere: Secondary | ICD-10-CM

## 2024-11-07 DIAGNOSIS — I6522 Occlusion and stenosis of left carotid artery: Secondary | ICD-10-CM

## 2024-11-07 DIAGNOSIS — R7989 Other specified abnormal findings of blood chemistry: Secondary | ICD-10-CM

## 2024-11-07 LAB — COMPREHENSIVE METABOLIC PANEL WITH GFR
ALT: 169 U/L — ABNORMAL HIGH (ref 0–44)
AST: 132 U/L — ABNORMAL HIGH (ref 15–41)
Albumin: 3.2 g/dL — ABNORMAL LOW (ref 3.5–5.0)
Alkaline Phosphatase: 113 U/L (ref 38–126)
Anion gap: 8 (ref 5–15)
BUN: 25 mg/dL — ABNORMAL HIGH (ref 8–23)
CO2: 31 mmol/L (ref 22–32)
Calcium: 8.5 mg/dL — ABNORMAL LOW (ref 8.9–10.3)
Chloride: 91 mmol/L — ABNORMAL LOW (ref 98–111)
Creatinine, Ser: 0.8 mg/dL (ref 0.44–1.00)
GFR, Estimated: >60 mL/min
Glucose, Bld: 126 mg/dL — ABNORMAL HIGH (ref 70–99)
Potassium: 5 mmol/L (ref 3.5–5.1)
Sodium: 129 mmol/L — ABNORMAL LOW (ref 135–145)
Total Bilirubin: <0.2 mg/dL (ref 0.0–1.2)
Total Protein: 5.8 g/dL — ABNORMAL LOW (ref 6.5–8.1)

## 2024-11-07 LAB — MISC LABCORP TEST (SEND OUT): Labcorp test code: 9985

## 2024-11-07 LAB — CBC
HCT: 29.5 % — ABNORMAL LOW (ref 36.0–46.0)
Hemoglobin: 9.1 g/dL — ABNORMAL LOW (ref 12.0–15.0)
MCH: 23.3 pg — ABNORMAL LOW (ref 26.0–34.0)
MCHC: 30.8 g/dL (ref 30.0–36.0)
MCV: 75.4 fL — ABNORMAL LOW (ref 80.0–100.0)
Platelets: 628 K/uL — ABNORMAL HIGH (ref 150–400)
RBC: 3.91 MIL/uL (ref 3.87–5.11)
RDW: 21.1 % — ABNORMAL HIGH (ref 11.5–15.5)
WBC: 11.8 K/uL — ABNORMAL HIGH (ref 4.0–10.5)
nRBC: 0 % (ref 0.0–0.2)

## 2024-11-07 LAB — CULTURE, BODY FLUID W GRAM STAIN -BOTTLE: Culture: NO GROWTH

## 2024-11-07 MED ORDER — BREZTRI AEROSPHERE 160-9-4.8 MCG/ACT IN AERO
2.0000 | INHALATION_SPRAY | Freq: Two times a day (BID) | RESPIRATORY_TRACT | 1 refills | Status: AC
  Filled 2024-11-07: qty 10.7, 30d supply, fill #0

## 2024-11-07 MED ORDER — CLOTRIMAZOLE 10 MG MT TROC
10.0000 mg | Freq: Every day | OROMUCOSAL | 0 refills | Status: AC
  Filled 2024-11-07: qty 25, 5d supply, fill #0

## 2024-11-07 MED ORDER — ASPIRIN 81 MG PO TBEC
81.0000 mg | DELAYED_RELEASE_TABLET | Freq: Every day | ORAL | 1 refills | Status: AC
  Filled 2024-11-07: qty 30, 30d supply, fill #0

## 2024-11-07 MED ORDER — ALBUTEROL SULFATE (2.5 MG/3ML) 0.083% IN NEBU
2.5000 mg | INHALATION_SOLUTION | Freq: Four times a day (QID) | RESPIRATORY_TRACT | 1 refills | Status: AC | PRN
  Filled 2024-11-07: qty 90, 8d supply, fill #0

## 2024-11-07 MED ORDER — ADULT MULTIVITAMIN W/MINERALS CH: 1.0000 | ORAL_TABLET | Freq: Every day | ORAL | Status: AC

## 2024-11-07 MED ORDER — FERROUS SULFATE 325 (65 FE) MG PO TABS
325.0000 mg | ORAL_TABLET | Freq: Every day | ORAL | 1 refills | Status: AC
  Filled 2024-11-07: qty 30, 30d supply, fill #0

## 2024-11-07 MED ORDER — POLYETHYLENE GLYCOL 3350 17 GM/SCOOP PO POWD
17.0000 g | Freq: Every day | ORAL | 0 refills | Status: AC
  Filled 2024-11-07: qty 238, 14d supply, fill #0

## 2024-11-07 MED ORDER — THIAMINE HCL 100 MG PO TABS
100.0000 mg | ORAL_TABLET | Freq: Every day | ORAL | 1 refills | Status: AC
  Filled 2024-11-07: qty 30, 30d supply, fill #0

## 2024-11-07 NOTE — Discharge Instructions (Signed)

## 2024-11-07 NOTE — TOC Progression Note (Signed)
 Transition of Care (TOC) - Progression Note   Notified [facility] of discharge today  Patient Details  Name: Brandi Mcmahon MRN: [id number] Date of Birth: Feb 24, 1941  Transition of Care [facility]) CM/SW Contact  Nabria Nevin, Powell Jansky, RN Phone Number: 11/07/2024, 1:07 PM  Clinical Narrative:       Expected Discharge Plan: Home w Home Health Services Barriers to Discharge: Continued Medical Work up               Expected Discharge Plan and Services     Post Acute Care Choice: Home Health Living arrangements for the past 2 months: Single Family Home Expected Discharge Date: 11/07/24                           [facility] Agency: [facility] ([facility]         Social Drivers of Health (SDOH) Interventions SDOH Screenings   Food Insecurity: No Food Insecurity (11/01/2024)  Housing: Low Risk (11/01/2024)  Transportation Needs: No Transportation Needs (11/01/2024)  Utilities: Not At Risk (10/29/2024)  Financial Resource Strain: Low Risk (10/12/2024)   Received from [facility]  Physical Activity: Inactive (10/12/2024)   Received from [facility]  Social Connections: Moderately Isolated (11/01/2024)  Stress: No Stress Concern Present (10/12/2024)   Received from [facility]  Tobacco Use: Medium Risk (10/30/2024)  Health Literacy: Low Risk (10/12/2024)   Received from [facility]    Readmission Risk Interventions    10/30/2024    1:46 PM  Readmission Risk Prevention Plan  Transportation Screening Complete  PCP or Specialist Appt within 5-7 Days Not Complete  Home Care Screening Complete  Medication Review (RN CM) Complete

## 2024-11-07 NOTE — Plan of Care (Signed)

## 2024-11-07 NOTE — Discharge Summary (Signed)
 Physician Discharge Summary  Brandi Mcmahon FMW:[id number] DOB: 09-Jul-1941  PCP: Mavis Coy  Admitted from: Home Discharged to: Home  Admit date: 10/28/2024 Discharge date: 11/07/2024  Recommendations for Outpatient Follow-up:    Contact information for follow-up providers     [facility] [place] Follow up in 1 month(s).   Specialty: Vascular Surgery Contact information: [address] [place]  [address] [phone]        Mavis Coy. Schedule an appointment as soon as possible for a visit in 1 week(s).   Specialty: Family Medicine Why: To be seen with repeat labs (CBC & CMP). Contact information: [address] [place] [address] [phone]         Mansouraty, Aloha Raddle., MD Follow up on 11/15/2024.   Specialties: Gastroenterology, Internal Medicine Why: Appointment at 10:10 AM.  However please arrive approximately 2 hours prior for blood tests (CBC & CMP) that will be drawn prior to the appointment. Contact information: [address] [place] [address] [phone]         Catherine Cools, MD. Schedule an appointment as soon as possible for a visit.   Specialties: Pulmonary Disease, Internal Medicine Why: Office will try to arrange for an appointment to be seen in 2 to 4 weeks in the [place] office.  Please call them if you do not hear from them in the next 3-5 business days. Contact information: [address] [place] [address] [phone]              Contact information for after-discharge care     Home Medical Care     [facility]) .   Service: Home Health Services Contact information: [phone] [place]  [phone]                      Home Health: Home Health Orders (From admission, onward)     Start     Ordered   10/31/24 1802  Home Health  At  discharge       Question:  To provide the following care/treatments  Answer:  PT   10/31/24 1802             Equipment/Devices: None.  Patient has a rollator at home.    Discharge Condition: Improved and stable.   Code Status: Limited: Do not attempt resuscitation (DNR) -DNR-LIMITED -Do Not Intubate/DNI  Diet recommendation:  Discharge Diet Orders (From admission, onward)     Start     Ordered   11/07/24 0000  Diet general        11/07/24 1218             Discharge Diagnoses:  Principal Problem:   CAP (community acquired pneumonia) Active Problems:   TIA (transient ischemic attack)   GERD (gastroesophageal reflux disease)   Hyperlipidemia   Hypertension   Monoclonal gammopathy   Chronic respiratory failure with hypoxia (HCC)   Acute exacerbation of chronic obstructive pulmonary disease (COPD) (HCC)   Cerebral aneurysm   Protein-calorie malnutrition, severe   Brief Hospital Course:  84 year old female lives with son and daughter-in-law, ambulates with the help of a walker, medical history significant for tobacco abuse, recently quit in September 2025, chronic respiratory failure with hypoxia on 2 L/min [place] oxygen, monoclonal gammopathy, chronic microcytic anemia, CAD, PAD s/p bilateral common iliac artery stent on Plavix , HTN,  HLD, GERD, initially presented to [facility] with complaints of 1 week history of productive cough and chest congestion.  She also had an episode of aphasia, facial droop and slurred speech witnessed by her family prior to coming to the ED, episode lasting 10 minutes.   At [facility], sustained tachypnea up to 27/min, transient tachycardia, but no hypoxia documented.  Chest x-ray showed consolidation on the left side along with pleural effusion.  CT chest showed large loculated left pleural effusion and concern for malignant pleural disease such as pleural metastasis or mesothelioma.  S/p 125 mL ultrasound-guided thoracentesis, exudative fluid, cytology pending,  case was discussed by TRH with PCCM on 1/7 and patient was transferred to [facility] for further evaluation and management.  Case had also been discussed with neurology for TIA who recommended DAPT and outpatient vascular surgery consultation for critical left ICA stenosis.   At [facility], PCCM consulted, left-sided chest tube placed. Vascular surgery consulted for symptomatic left ICA critical stenosis, TCAR recommended but patient currently not interested, vascular surgery plans to closely follow-up outpatient for same.  Chest tube removed 1/11.       Assessment & Plan:    Community-acquired pneumonia Large loculated exudative left parapneumonic pleural effusion Completed azithromycin  x 5 days and 9 days of IV ceftriaxone , see no need for continuation, discontinued all antibiotics 1/12. Blood cultures x 2 negative.   PCCM consulted.  Placed left-sided chest tube and gave lytics for loculation.  Pleural effusion has almost completely drained.  Has hydropneumothorax/pneumothorax ex vacuo due to lack of lung expansion.  Chest tube was removed at 1/11.  As per PCCM, lung will not expand, that space will fill back up with fluid eventually and eventually formed fibrothorax.  Patient has been counseled extensively to seek immediate medical attention if he has worsening dyspnea. 1/11 Chest x-ray continues to show moderate left hydropneumothorax but smaller than prior. Pleural fluid culture 1/4 and 1/8 negative.  Pleural fluid cytology, 1/4 & 1/8 showed no malignant cells Discussed with Dr. Annella, PCCM who recommends that patient to be discharged home Recommends Breztri  2 puffs twice daily Breztri  2 puffs twice daily with instruction to rinse mouth after every use, recommends discharging with albuterol  nebulizer and new nebulizer machine for which TOC has been consulted and he will arrange outpatient pulmonology follow-up in 2 to 4 weeks in [place]. Improved and stable.   Chronic respiratory failure with  hypoxia COPD exacerbation Stable on 2 L/min [place] oxygen, continue Transitioned IV Solu-Medrol  to prednisone  40 mg daily from 1/11.  Has been on steroids now since 1/4.  See no further indication, discontinued prednisone . Continue bronchodilators. Improved and stable.   Abnormal LFTs Patient had normal AST ALT on arrival.  Mildly elevated for the last couple days.  No GI symptoms reported.  Abdominal exam benign. Acute hepatitis panel negative. RUQ ultrasound: Gallbladder sludge or stone.  No sonographic evidence of acute cholecystitis.  Fatty liver.  AST has plateaued.  ALT mildly up compared to yesterday.  Alkaline phosphatase and bilirubin have been normal. On day of discharge, discussed in detail with [facility] MD on-call.  LFT abnormalities are hepatitis in nature.  Suspect due to antibiotics.  Agrees with holding statins.  Patient has a upcoming follow-up appointment with Dr. Wilhelmenia on 1/21, GI will arrange repeat stat labs (CMP) that can be drawn on the morning of office visit.  Patient is advised to come in an hour or 2 prior to the appointment.  Continue to hold statins until outpatient GI/PCP  visit.    Tobacco use disorder Quit September 2025.  Congratulated and encouraged continued cessation   TIA Suspected secondary to critical left ICA stenosis No recurrence of symptoms since hospital admission. CT Mcmahon 1/4: No acute intracranial abnormality.  CTA Mcmahon and neck 1/4: Critical stenosis (>90%) of the left ICA origin, less than 30% stenosis of proximal right ICA and less than 20% stenosis of the carotid siphons.  Ulcerative plaque and moderate calcific plaque in the distal aortic arch, which can be a potential embolic source. MRI brain 1/4: No acute intracranial abnormality. LDL 32.  A1c 5.5.  TTE 1/4: LVEF 60-65%. Therapies recommend home health PT.  No OT follow-up. Currently on aspirin  81 mg daily + Plavix  75 mg daily (held on 1/8 for lytics) through left chest tube and  rosuvastatin  20 mg daily.  Discussed with Dr. Annella, PCCM, no plans for further lytics and okay to resume Plavix , ordered. Vascular surgery consultation and follow-up appreciated.  TCAR recommended but patient not ready at this time.  [facility] plans to follow outpatient and arrange same.  Patient advised regarding increased stroke risk.  She is aware. 1/9: Discussed in detail with Dr.Gaurang Palikh, Neurohospitalist and was advised that there was no need for a formal consultation.  Plan and antiplatelet management per vascular surgery.   Microcytic anemia Thrombocytosis Hemoglobin this admission is stable in the low 7 g range.  However anemia is worse since 2023 Anemia panel: Iron 21, saturation ratio 7, TIBC 329, ferritin 143, folate 9.8 and B12: 922.  Started iron supplements. 1/11, hemoglobin down to 6.8, s/p 1 unit PRBC, hemoglobin up to 9.6 > 9.5 >9.1.  No overt bleeding.  Follow CBC as outpatient periodically.  Outpatient further evaluation and management as per PCP.   Hypokalemia Hypomagnesemia Replaced.   GERD PPI   HLD LDL at goal.  Holding statins due to abnormal LFTs.  Resume when able as outpatient.   Essential hypertension Mildly uncontrolled.  Continue current meds.  Follow-up closely as outpatient regarding adjustment of meds.   Monoclonal gammopathy Outpatient hematology follow-up.   1 mm ACA aneurysm Noted on CTA Mcmahon and neck 1/4. As per radiology recommendation, follow-up CTA Mcmahon in 1 to 2 years to ensure stability   Anxiety disorder Continue home as needed Valium .   Right sided sciatica/chronic low back pain Improved.   Oral thrush Rooming.  Complete clotrimazole  course   Nutrition Status: Nutrition Problem: Severe Malnutrition Etiology: acute illness Signs/Symptoms: energy intake < or equal to 50% for > or equal to 5 days, severe muscle depletion, severe fat depletion Interventions: Refer to RD note for recommendations Agree with registered dietitian  evaluation and management, continue management as per their recommendations.   Body mass index is 22.79 kg/m.      Consultants:   PCCM Vascular surgery   Discharge Instructions  Discharge Instructions     Call MD for:  difficulty breathing, headache or visual disturbances   Complete by: As directed    Call MD for:  extreme fatigue   Complete by: As directed    Call MD for:  persistant dizziness or light-headedness   Complete by: As directed    Call MD for:  persistant nausea and vomiting   Complete by: As directed    Call MD for:  severe uncontrolled pain   Complete by: As directed    Call MD for:  temperature >100.4   Complete by: As directed    Diet general   Complete by: As directed  Increase activity slowly   Complete by: As directed    No wound care   Complete by: As directed         Medication List     PAUSE taking these medications    rosuvastatin  20 MG tablet Wait to take this until your doctor or other care provider tells you to start again. Commonly known as: CRESTOR  Take 20 mg by mouth daily.       TAKE these medications    albuterol  108 (90 Base) MCG/ACT inhaler Commonly known as: VENTOLIN  HFA Inhale 1-2 puffs into the lungs 4 (four) times daily as needed for shortness of breath or wheezing. What changed: Another medication with the same name was added. Make sure you understand how and when to take each.   albuterol  (2.5 MG/3ML) 0.083% nebulizer solution Commonly known as: PROVENTIL  Take 3 mLs (2.5 mg total) by nebulization every 6 (six) hours as needed for wheezing or shortness of breath. What changed: You were already taking a medication with the same name, and this prescription was added. Make sure you understand how and when to take each.   aspirin  EC 81 MG tablet Take 1 tablet (81 mg total) by mouth daily. Swallow whole.   Breztri  Aerosphere 160-9-4.8 MCG/ACT Aero inhaler Generic drug: budesonide -glycopyrrolate -formoterol  Inhale 2  puffs into the lungs in the morning and at bedtime. rinse mouth after every use.   clopidogrel  75 MG tablet Commonly known as: PLAVIX  Take 75 mg by mouth daily.   clotrimazole  10 MG troche Commonly known as: MYCELEX  Take 1 tablet (10 mg total) by mouth 5 (five) times daily for 5 days.   diazepam  5 MG tablet Commonly known as: VALIUM  Take 5 mg by mouth 3 (three) times daily as needed for anxiety.   ferrous sulfate  325 (65 FE) MG tablet Take 1 tablet (325 mg total) by mouth daily with breakfast. Start taking on: November 08, 2024   lisinopril  20 MG tablet Commonly known as: ZESTRIL  Take 20 mg by mouth daily.   multivitamin with minerals Tabs tablet Take 1 tablet by mouth daily. Start taking on: November 08, 2024   Myrbetriq  50 MG Tb24 tablet Generic drug: mirabegron  ER Take 50 mg by mouth daily.   pantoprazole  40 MG tablet Commonly known as: PROTONIX  Take 1 tablet (40 mg total) by mouth daily.   polyethylene glycol powder 17 GM/SCOOP powder Commonly known as: GLYCOLAX /MIRALAX  Take 17 g by mouth daily. What changed:  how much to take when to take this   thiamine  100 MG tablet Commonly known as: VITAMIN B1 Take 1 tablet (100 mg total) by mouth daily. Start taking on: November 08, 2024   Vitamin D -1000 Max St 25 MCG (1000 UT) tablet Generic drug: Cholecalciferol  Take 25 mcg by mouth.       Allergies[1]    Procedures/Studies: US  Abdomen Limited RUQ (LIVER/GB) Result Date: 11/06/2024 CLINICAL DATA:  Abnormal LFTs. EXAM: ULTRASOUND ABDOMEN LIMITED RIGHT UPPER QUADRANT COMPARISON:  None Available. FINDINGS: Evaluation is limited due to body habitus. Gallbladder: There is sludge or possibly stones in the gallbladder. No gallbladder wall thickening or pericholecystic fluid. Negative sonographic Murphy's sign. Common bile duct: Diameter: 2 mm Liver: There is diffuse increased liver echogenicity most commonly seen in the setting of fatty infiltration. Superimposed  inflammation or fibrosis is not excluded. Clinical correlation is recommended. Portal vein is patent on color Doppler imaging with normal direction of blood flow towards the liver. Other: None. IMPRESSION: 1. Gallbladder sludge or stone. No sonographic evidence  of acute cholecystitis. 2. Fatty liver. Electronically Signed   By: Vanetta Chou M.D.   On: 11/06/2024 20:50   DG CHEST PORT 1 VIEW Result Date: 11/06/2024 CLINICAL DATA:  Encounter for chest tube placement. EXAM: PORTABLE CHEST 1 VIEW COMPARISON:  11/05/2024 FINDINGS: Left chest tube has been removed. Persistent lucency along the lateral aspect of the mid and lower left lung most likely represents a loculated pneumothorax. Findings could be associated with a hydropneumothorax and similar to the chest radiograph on 11/05/2024. Again noted are prominent interstitial lung markings suggestive chronic changes in both lungs. Heart size is grossly stable. Atherosclerotic calcifications at the aortic arch. IMPRESSION: 1. Removal of left chest tube. Persistent lucency along the lateral aspect of the left lung most likely represents a loculated pneumothorax. Findings could be associated with a hydropneumothorax. 2. Chronic lung changes. Electronically Signed   By: Juliene Balder M.D.   On: 11/06/2024 08:47   DG Chest Port 1 View Result Date: 11/05/2024 CLINICAL DATA:  Pleural effusion. EXAM: PORTABLE CHEST 1 VIEW COMPARISON:  11/04/2024 and CT chest 10/29/2024. FINDINGS: Heart size stable. Thoracic aorta is calcified. Small bore chest tube in the base of the left hemithorax with a loculated moderate left hydropneumothorax. The amount of pleural air appears to have decreased somewhat in the interval. Patchy airspace opacification in the perihilar left lung. Lungs are emphysematous. Right apical pleural thickening. Right lung is grossly clear. IMPRESSION: 1. Moderate left hydropneumothorax. Amount of pleural air appears to have decreased slightly in the interval.  2. Left upper left lower lobe pneumonia. 3.  Emphysema (ICD10-J43.9). Electronically Signed   By: Newell Eke M.D.   On: 11/05/2024 11:22   DG Chest Port 1 View Result Date: 11/04/2024 EXAM: 1 VIEW XRAY OF THE CHEST 11/04/2024 04:54:00 PM COMPARISON: 11/04/2024 CLINICAL HISTORY: Pneumothorax FINDINGS: LINES, TUBES AND DEVICES: Chest tube is unchanged, but appears coiled at the left lung base and kinked. Correlation for appropriate chest tube function is recommended. LUNGS AND PLEURA: Persistent small, inferolaterally loculated pneumothorax. Progressive consolidation within the left mid and lower lung zone. Diffuse interstitial coarsening are noted, likely related to underlying interstitial lung disease. No pneumothorax or pleural effusion on the right. HEART AND MEDIASTINUM: Cardiac size within normal limits. Atherosclerotic plaque. BONES AND SOFT TISSUES: No acute osseous abnormality. IMPRESSION: 1. Persistent small inferolaterally loculated left pneumothorax. 2. Left chest tube is unchanged but appears coiled at the left lung base and kinked, and assessment of chest tube function is recommended. 3. Progressive consolidation in the left mid and lower lung. 4. Chronic diffuse interstitial coarsening consistent with underlying interstitial lung disease. 5. Atherosclerotic plaque. Electronically signed by: Dorethia Molt MD MD 11/04/2024 08:17 PM EST RP Workstation: [id number]   DG CHEST PORT 1 VIEW Result Date: 11/04/2024 EXAM: 1 VIEW XRAY OF THE CHEST 11/04/2024 06:18:00 AM COMPARISON: 11/03/2024 CLINICAL HISTORY: Pleural effusion FINDINGS: LINES, TUBES AND DEVICES: Left sided pleural pigtail catheter is unchanged in position. LUNGS AND PLEURA: Small volume inferolateral left sided pneumothorax is unchanged. There is likely small volume residual left pleural fluid as well. Similar left perihilar and less so infrahilar airspace disease. Moderate underlying interstitial thickening may relate to smoking /  chronic bronchitis. HEART AND MEDIASTINUM: Mild cardiomegaly. BONES AND SOFT TISSUES: No acute osseous abnormality. IMPRESSION: 1. Left sided pleural pigtail catheter in place with similar inferolateral left pleural air and likely small volume pleural fluid. 2. Left sided airspace disease is similar, pneumonia or lymphangitic tumor spread based on prior CT. Electronically signed  by: Rockey Kilts MD MD 11/04/2024 02:32 PM EST RP Workstation: [id number]   DG CHEST PORT 1 VIEW Result Date: 11/03/2024 CLINICAL DATA:  Chest tube in place. EXAM: PORTABLE CHEST 1 VIEW COMPARISON:  Radiograph yesterday FINDINGS: Left basilar pigtail catheter remains in place. Increased size in basal lateral left pneumothorax, small to moderate. Diminished pleural effusion with residual opacification in the lower left hemithorax. Background chronic lung disease again seen. The heart is grossly normal in size. IMPRESSION: 1. Increased size of basal lateral left pneumothorax, small to moderate. Left basilar pigtail catheter remains in place. 2. Diminished left pleural effusion with residual opacification in the lower left hemithorax. Electronically Signed   By: Andrea Gasman M.D.   On: 11/03/2024 15:16   DG CHEST PORT 1 VIEW Result Date: 11/02/2024 CLINICAL DATA:  Chest tube placement. EXAM: PORTABLE CHEST 1 VIEW COMPARISON:  10/30/2024 FINDINGS: Interval placement of left basilar chest tube. Patient is slightly rotated to the left. Persistent moderate left effusion and stable airspace opacification over the left mid to upper lung. Left lung volume loss with stable mild cardiomediastinal shift to the left. Stable coarse interstitial changes of the right lung. Remainder of the exam is unchanged. IMPRESSION: 1. Interval placement of left basilar chest tube. Persistent moderate left effusion and stable airspace opacification over the left mid to upper lung. Stable left lung volume loss. 2. Stable coarse interstitial changes of the right  lung. Electronically Signed   By: Toribio Agreste M.D.   On: 11/02/2024 10:36   DG Chest 1 View Result Date: 10/30/2024 CLINICAL DATA:  Status post small volume left thoracentesis. EXAM: CHEST  1 VIEW COMPARISON:  10/29/2024 FINDINGS: Minimal change in the left effusion following small volume thoracentesis only removing 125 cc due to loculation. No pneumothorax. Similar airspace opacities throughout the left lung. Mild interstitial pattern throughout the right lung, suspect chronic. No large right effusion. Trachea midline. Cardiac silhouette is obscured. Aorta atherosclerotic. Degenerative changes of the spine. IMPRESSION: Minimal change in the left effusion following small volume thoracentesis. No pneumothorax. Electronically Signed   By: CHRISTELLA.  Shick M.D.   On: 10/30/2024 11:57   US  THORACENTESIS ASP PLEURAL SPACE W/IMG GUIDE Result Date: 10/30/2024 INDICATION: Patient admitted with pneumonia and new loculated left pleural effusion concerning for possible empyema. Request for diagnostic and therapeutic thoracentesis. EXAM: ULTRASOUND GUIDED LEFT THORACENTESIS MEDICATIONS: 8 mL 1% lidocaine  COMPLICATIONS: None immediate. PROCEDURE: An ultrasound guided thoracentesis was thoroughly discussed with the patient and questions answered. The benefits, risks, alternatives and complications were also discussed. The patient understands and wishes to proceed with the procedure. Written consent was obtained. Ultrasound was performed to localize and mark an adequate pocket of fluid in the left chest. The area was then prepped and draped in the normal sterile fashion. 1% Lidocaine  was used for local anesthesia. Under ultrasound guidance a 19 gauge, 7-cm, Yueh catheter was introduced. Thoracentesis was performed. The catheter was removed and a dressing applied. FINDINGS: A total of approximately 125 mL of clear, serosanguineous fluid was removed. Due to highly loculated nature of the effusion, no further fluid was able to  removed. samples were sent to the laboratory as requested by the clinical team. IMPRESSION: Successful ultrasound guided left thoracentesis yielding 125 mL of pleural fluid. Performed by Clotilda Hesselbach, PA-C Electronically Signed   By: CHRISTELLA.  Shick M.D.   On: 10/30/2024 11:54   ECHOCARDIOGRAM COMPLETE Result Date: 10/29/2024    ECHOCARDIOGRAM REPORT   Patient Name:   NOHA MILBERGER Date of  Exam: 10/29/2024 Medical Rec #:  [id number]     Height:       60.0 in Accession #:    [id number]    Weight:       121.0 lb Date of Birth:  05-May-1941    BSA:          1.508 m Patient Age:    83 years      BP:           122/55 mmHg Patient Gender: F             HR:           106 bpm. Exam Location:  [facility] Procedure: 2D Echo, Cardiac Doppler and Color Doppler (Both Spectral and Color            Flow Doppler were utilized during procedure). Indications:    TIA G45.9  History:        Patient has no prior history of Echocardiogram examinations.                 Risk Factors:Hypertension.  Sonographer:    Jayson Gaskins Referring Phys: [id number] CAROLE N HALL IMPRESSIONS  1. Left ventricular ejection fraction, by estimation, is 60 to 65%. The left ventricle has normal function. The left ventricle has no regional wall motion abnormalities. Left ventricular diastolic parameters are indeterminate.  2. Right ventricular systolic function is normal. The right ventricular size is normal. There is mildly elevated pulmonary artery systolic pressure. The estimated right ventricular systolic pressure is 43.2 mmHg.  3. The mitral valve is normal in structure. No evidence of mitral valve regurgitation. No evidence of mitral stenosis.  4. The aortic valve is normal in structure. Aortic valve regurgitation is not visualized. No aortic stenosis is present.  5. The inferior vena cava is normal in size with greater than 50% respiratory variability, suggesting right atrial pressure of 3 mmHg. FINDINGS  Left Ventricle: Left ventricular ejection fraction,  by estimation, is 60 to 65%. The left ventricle has normal function. The left ventricle has no regional wall motion abnormalities. The left ventricular internal cavity size was normal in size. There is  no left ventricular hypertrophy. Left ventricular diastolic parameters are indeterminate. Right Ventricle: The right ventricular size is normal. No increase in right ventricular wall thickness. Right ventricular systolic function is normal. There is mildly elevated pulmonary artery systolic pressure. The tricuspid regurgitant velocity is 3.17  m/s, and with an assumed right atrial pressure of 3 mmHg, the estimated right ventricular systolic pressure is 43.2 mmHg. Left Atrium: Left atrial size was normal in size. Right Atrium: Right atrial size was normal in size. Pericardium: There is no evidence of pericardial effusion. Mitral Valve: The mitral valve is normal in structure. Mild to moderate mitral annular calcification. No evidence of mitral valve regurgitation. No evidence of mitral valve stenosis. Tricuspid Valve: The tricuspid valve is normal in structure. Tricuspid valve regurgitation is mild . No evidence of tricuspid stenosis. Aortic Valve: The aortic valve is normal in structure. Aortic valve regurgitation is not visualized. No aortic stenosis is present. Aortic valve mean gradient measures 2.0 mmHg. Aortic valve peak gradient measures 3.8 mmHg. Aortic valve area, by VTI measures 2.63 cm. Pulmonic Valve: The pulmonic valve was normal in structure. Pulmonic valve regurgitation is not visualized. No evidence of pulmonic stenosis. Aorta: The aortic root is normal in size and structure. Venous: The inferior vena cava is normal in size with greater than 50% respiratory variability, suggesting right atrial pressure  of 3 mmHg. IAS/Shunts: No atrial level shunt detected by color flow Doppler.  LEFT VENTRICLE PLAX 2D LVIDd:         4.10 cm   Diastology LVIDs:         2.60 cm   LV e' medial:    5.87 cm/s LV PW:          0.80 cm   LV E/e' medial:  16.1 LV IVS:        1.00 cm   LV e' lateral:   6.53 cm/s LVOT diam:     1.80 cm   LV E/e' lateral: 14.5 LV SV:         52 LV SV Index:   35 LVOT Area:     2.54 cm  RIGHT VENTRICLE RV S prime:     18.40 cm/s TAPSE (M-mode): 2.6 cm LEFT ATRIUM             Index        RIGHT ATRIUM           Index LA Vol (A2C):   24.6 ml 16.32 ml/m  RA Area:     14.30 cm LA Vol (A4C):   66.5 ml 44.11 ml/m  RA Volume:   35.00 ml  23.22 ml/m LA Biplane Vol: 42.9 ml 28.46 ml/m  AORTIC VALVE AV Area (Vmax):    2.70 cm AV Area (Vmean):   2.74 cm AV Area (VTI):     2.63 cm AV Vmax:           98.00 cm/s AV Vmean:          70.800 cm/s AV VTI:            0.198 m AV Peak Grad:      3.8 mmHg AV Mean Grad:      2.0 mmHg LVOT Vmax:         104.00 cm/s LVOT Vmean:        76.100 cm/s LVOT VTI:          0.205 m LVOT/AV VTI ratio: 1.04  AORTA Ao Root diam: 2.70 cm MITRAL VALVE                TRICUSPID VALVE MV Area (PHT): 4.04 cm     TR Peak grad:   40.2 mmHg MV Decel Time: 188 msec     TR Vmax:        317.00 cm/s MV E velocity: 94.40 cm/s MV A velocity: 107.00 cm/s  SHUNTS MV E/A ratio:  0.88         Systemic VTI:  0.20 m                             Systemic Diam: 1.80 cm Wilbert Bihari MD Electronically signed by Wilbert Bihari MD Signature Date/Time: 10/29/2024/11:21:50 AM    Final    CT ANGIO Mcmahon NECK W WO CM Addendum Date: 10/29/2024  ADDENDUM #1 ADDENDUM: The study is reviewed because AIdoc software detected a 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 A follow up CT angiogram of the Mcmahon in 1 to 2 years is recommended to ensure stability. ---------------------------------------------------- Electronically signed by:  Evalene Coho  MD 10/29/2024 10:04 AM EST RP Workstation: [id number]   Result Date: 10/29/2024 ORIGINAL REPORT  EXAM: CTA Mcmahon AND NECK WITH AND WITHOUT 10/29/2024 06:19:28 AM TECHNIQUE: CTA of the Mcmahon and neck was performed with and without the administration of 75 mL of iohexol  (OMNIPAQUE ) 350 MG/ML injection. Multiplanar 2D and/or 3D reformatted images are provided for review. Automated exposure control, iterative reconstruction, and/or weight based adjustment of the mA/kV was utilized to reduce the radiation dose to as low as reasonably achievable. Stenosis of the internal carotid arteries measured using NASCET criteria. COMPARISON: CT of the Mcmahon dated 10/29/2024 CLINICAL HISTORY: Transient ischemic attack (TIA) FINDINGS: CTA NECK: AORTIC ARCH AND ARCH VESSELS: Ulcerative plaque present within the distal aortic arch. Moderate calcific plaque present. Standard 3-vessel takeoff of the great arteries. No dissection or arterial injury. No significant stenosis of the brachiocephalic or subclavian arteries. The ulcerative and moderate calcific plaque in the distal aortic arch represents a potential source of emboli. Medical management for atherosclerosis is recommended. CERVICAL CAROTID ARTERIES: Extensive calcific plaque within the origin of the left internal carotid artery with critical stenosis, greater than 90%. Calcific plaque within the proximal right internal carotid artery with less than 30% luminal stenosis. The left external carotid artery is patent without significant stenosis. No dissection or arterial injury. The critical left internal carotid artery stenosis is a significant finding that may be related to the patient's transient ischemic attack (TIA). Consideration for carotid endarterectomy (CEA) or carotid artery stenting (CAS) is recommended, depending on patient symptoms and risk factors. Further evaluation with dedicated carotid ultrasound or MRA may be warranted. CERVICAL VERTEBRAL ARTERIES: The left vertebral  artery is dominant and the right vertebral artery is hypoplastic but patent. No dissection, arterial injury, or significant stenosis. LUNGS AND MEDIASTINUM: Moderate central lobular emphysema. Coarse consolidation of the left upper lung. Clinical correlation and potential follow-up imaging (e.g., dedicated chest CT) for the lung findings. SOFT TISSUES: No acute abnormality. BONES: No acute abnormality. CTA Mcmahon: ANTERIOR CIRCULATION: Calcific plaque within the carotid siphons bilaterally with less than 20% luminal stenosis. No significant stenosis of the anterior cerebral arteries. No significant stenosis of the middle cerebral arteries. No aneurysm. POSTERIOR CIRCULATION: Fetal type origin of the right posterior cerebral artery with a diminutive right P1 segment. No significant stenosis of the posterior cerebral arteries. No significant stenosis of the basilar artery. No significant stenosis of the vertebral arteries. No aneurysm. OTHER: No dural venous sinus thrombosis on this non-dedicated study. IMPRESSION: 1. 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 2. Less than 30% stenosis of the proximal right internal carotid artery and less than 20% stenosis of the carotid siphons bilaterally due to calcific plaque. 3. Ulcerative plaque and moderate calcific plaque in the distal aortic arch, which can be a potential embolic source. 4. Fetal type origin of the right posterior cerebral artery with a diminutive right P1 segment. 5. Moderate centrilobular emphysema and coarse left upper lobe consolidation. Electronically signed by: Evalene Coho MD 10/29/2024 06:57 AM EST RP Workstation: [id number]   MR BRAIN W WO CONTRAST Result Date: 10/29/2024 EXAM: MRI BRAIN WITH AND WITHOUT CONTRAST 10/29/2024 09:14:24 AM TECHNIQUE: Multiplanar multisequence MRI of the Mcmahon/brain was  performed with and without the administration of 5 mL (gadobutrol  (GADAVIST ) 1 MMOL/ML injection 5 mL GADOBUTROL  1 MMOL/ML IV SOLN) intravenous contrast. COMPARISON: CT of the Mcmahon dated 10/29/2024. CLINICAL HISTORY: Transient ischemic attack (  TIA). FINDINGS: BRAIN AND VENTRICLES: There is age-related atrophy and mild-to-moderate periventricular and subcortical white matter disease. There is no restricted diffusion to indicate acute or recent infarction. No acute intracranial hemorrhage. No mass effect or midline shift. No hydrocephalus. The sella is unremarkable. Normal flow voids. There is no abnormal parenchymal or meningeal enhancement present. ORBITS: No acute abnormality. SINUSES: No acute abnormality. BONES AND SOFT TISSUES: Normal bone marrow signal and enhancement. No acute soft tissue abnormality. IMPRESSION: 1. No acute intracranial abnormality. No restricted diffusion to indicate acute or recent infarction. 2. Age-related atrophy and mild-to-moderate periventricular and subcortical white matter disease. No abnormal parenchymal or meningeal enhancement. Electronically signed by: Evalene Coho MD 10/29/2024 09:58 AM EST RP Workstation: [id number]   CT CHEST WO CONTRAST Result Date: 10/29/2024 EXAM: CT CHEST WITH CONTRAST 10/29/2024 06:19:28 AM TECHNIQUE: CT of the chest was performed with the administration of 75 mL of iohexol  (OMNIPAQUE ) 350 MG/ML intravenous contrast. Multiplanar reformatted images are provided for review. Automated exposure control, iterative reconstruction, and/or weight based adjustment of the mA/kV was utilized to reduce the radiation dose to as low as reasonably achievable. COMPARISON: 01/07/2022 CLINICAL HISTORY: Abnormal xray - lung opacity/opacities. FINDINGS: MEDIASTINUM: Aortic atherosclerosis and coronary artery calcifications. Soft tissue nodularity within the epicardial fat overlying the left ventricular apex. Index nodule measures 0.9 cm (image 116/2). Nodular  thickening of the pericardium is also noted. Index pericardial nodule anterior to the main pulmonary artery, measures 1.4 x 0.5 cm. No pericardial effusion identified. The central airways are clear. LYMPH NODES: Pre-vascular lymph node measures 1.5 cm (image 64/2). Right paratracheal lymph node which measures 1.3 cm (image 65/2). Subcarinal node measures 1.7 cm (image 77/2). Hilar lymph nodes are suboptimimally evaluated due to lack of iv contrast. No axillary lymphadenopathy. LUNGS AND PLEURA: Debris containing gas is noted within the distal left mainstem bronchus extending into the upper and lower lobe airways. A large loculated left pleural effusion is identified. Diffuse nodular pleural thickening overlies the entire left lung. There is diffuse soft tissue nodularity with interlobular septal thickening within the left upper lobe, with dense consolidative changes within the inferior left lower lobe and lingula. Partial atelectasis of the left lower lobe with scattered areas of nodular consolidation. Emphysematous changes are noted throughout the right lung. Posterior right base measures 4 mm (image 140/4). Right apical pleural parenchymal scarring. No pneumothorax. SOFT TISSUES/BONES: No acute abnormality of the bones or soft tissues. UPPER ABDOMEN: Limited images of the upper abdomen demonstrates no acute abnormality. Punctate stone noted within the interpolar collecting system of the left kidney. Extensive aortic atherosclerotic calcifications. IMPRESSION: 1. Large loculated left pleural effusion with diffuse nodular pleural thickening, most concerning for malignant pleural disease such as pleural metastases or mesothelioma, with empyema less likely. 2. 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 3.  Mediastinal lymphadenopathy, suspicious for nodal metastatic disease. 4. Epicardial fat and pericardial nodularity, suspicious for metastatic disease. 5. Recommend pulmonology consultation for diagnostic thoracentesis with cytology and consideration of pleural biopsy, and consider PET/CT for further evaluation and staging. Electronically signed by: Waddell Calk MD 10/29/2024 06:38 AM EST RP Workstation: [id number]   DG Chest 2 View Result Date: 10/29/2024 EXAM: 2 VIEW(S) XRAY OF THE CHEST 10/29/2024 12:30:10 AM COMPARISON: Comparison 11/07/2009. CT 01/07/2022. CLINICAL HISTORY: cough FINDINGS: LUNGS AND PLEURA: Emphysema with hyperinflation. Consolidation throughout the left lung concerning  for pneumonia. Underlying chronic lung disease/fibrosis. Possible left pleural effusion. No pneumothorax. HEART AND MEDIASTINUM: No acute abnormality of the cardiac and mediastinal silhouettes. BONES AND SOFT TISSUES: No acute osseous abnormality. IMPRESSION: 1. Consolidation throughout the left lung concerning for pneumonia. 2. Possible left pleural effusion. 3. Emphysema/chronic changes Electronically signed by: Franky Crease MD 10/29/2024 12:34 AM EST RP Workstation: [id number]   CT Mcmahon Wo Contrast Result Date: 10/29/2024 EXAM: CT Mcmahon WITHOUT 10/29/2024 12:23:26 AM TECHNIQUE: CT of the Mcmahon was performed without the administration of intravenous contrast. Automated exposure control, iterative reconstruction, and/or weight based adjustment of the mA/kV was utilized to reduce the radiation dose to as low as reasonably achievable. COMPARISON: None available. CLINICAL HISTORY: Neuro deficit, acute, stroke suspected Neuro deficit, acute, stroke suspected FINDINGS: BRAIN AND VENTRICLES: No acute intracranial hemorrhage. No mass effect or midline shift. No extra-axial fluid collection. No evidence of acute infarct. No hydrocephalus. ORBITS: No acute abnormality. SINUSES AND MASTOIDS: No acute abnormality. SOFT TISSUES AND SKULL:  No acute skull fracture. No acute soft tissue abnormality. IMPRESSION: 1. No acute intracranial abnormality. Electronically signed by: Franky Crease MD 10/29/2024 12:27 AM EST RP Workstation: [id number]      Subjective: Patient denies complaints.  Tongue soreness is improved.  No chest pain, dyspnea or pain elsewhere.  Discharge Exam:  Vitals:   11/06/24 2034 11/06/24 2045 11/07/24 0517 11/07/24 0922  BP:  (!) 169/61 (!) 158/68 (!) 152/100  Pulse:  81 75 89  Resp:  17 16 14   Temp:  97.7 F (36.5 C) 97.8 F (36.6 C) 97.8 F (36.6 C)  TempSrc:  Oral Oral Oral  SpO2: 98% 100% 95% 95%  Weight:      Height:        General exam: Elderly female, small built and frail, chronically ill looking lying comfortably propped up in bed without distress.  On 2 L/min [place] oxygen.  Getting ready to eat her breakfast. Respiratory system: Clear to auscultation right lung fields.  Improved breath sounds left lung fields.  Still slightly diminished in the left base.  Chest tube has been removed.  Stable. Cardiovascular system: S1 & S2 heard, RRR. No JVD, murmurs, rubs, gallops or clicks. No pedal edema.  Off telemetry. Gastrointestinal system: Abdomen is nondistended, soft and nontender. No organomegaly or masses felt. Normal bowel sounds heard.  Unremarkable abdominal exam. Central nervous system: Alert and oriented. No focal neurological deficits. Extremities: Symmetric 5 x 5 power. Skin: No rashes, lesions or ulcers Psychiatry: Judgement and insight appear normal. Mood & affect appropriate. Tongue with some thrush on the margins and red.?  Glossitis, appears to be improving.  Slightly better.    The results of significant diagnostics from this hospitalization (including imaging, microbiology, ancillary and laboratory) are listed below for reference.     Microbiology: Recent Results (from the past 240 hours)  Resp panel by RT-PCR (RSV, Flu A&B, Covid) Anterior Nasal Swab     Status: None    Collection Time: 10/29/24 12:04 AM   Specimen: Anterior Nasal Swab  Result Value Ref Range Status   SARS Coronavirus 2 by RT PCR NEGATIVE NEGATIVE Final    Comment: (NOTE) SARS-CoV-2 target nucleic acids are NOT DETECTED.  The SARS-CoV-2 RNA is generally detectable in upper respiratory specimens during the acute phase of infection. The lowest concentration of SARS-CoV-2 viral copies this assay can detect is 138 copies/mL. A negative result does not preclude SARS-Cov-2 infection and should not be used as the sole basis for treatment or  other patient management decisions. A negative result may occur with  improper specimen collection/handling, submission of specimen other than nasopharyngeal swab, presence of viral mutation(s) within the areas targeted by this assay, and inadequate number of viral copies(<138 copies/mL). A negative result must be combined with clinical observations, patient history, and epidemiological information. The expected result is Negative.  Fact Sheet for Patients:  bloggercourse.com  Fact Sheet for Healthcare Providers:  seriousbroker.it  This test is no t yet approved or cleared by the [place]  FDA and  has been authorized for detection and/or diagnosis of SARS-CoV-2 by FDA under an Emergency Use Authorization (EUA). This EUA will remain  in effect (meaning this test can be used) for the duration of the COVID-19 declaration under Section 564(b)(1) of the Act, 21 U.S.C.section 360bbb-3(b)(1), unless the authorization is terminated  or revoked sooner.       Influenza A by PCR NEGATIVE NEGATIVE Final   Influenza B by PCR NEGATIVE NEGATIVE Final    Comment: (NOTE) The Xpert Xpress SARS-CoV-2/FLU/RSV plus assay is intended as an aid in the diagnosis of influenza from Nasopharyngeal swab specimens and should not be used as a sole basis for treatment. Nasal washings and aspirates are unacceptable for  Xpert Xpress SARS-CoV-2/FLU/RSV testing.  Fact Sheet for Patients: bloggercourse.com  Fact Sheet for Healthcare Providers: seriousbroker.it  This test is not yet approved or cleared by the [place]  FDA and has been authorized for detection and/or diagnosis of SARS-CoV-2 by FDA under an Emergency Use Authorization (EUA). This EUA will remain in effect (meaning this test can be used) for the duration of the COVID-19 declaration under Section 564(b)(1) of the Act, 21 U.S.C. section 360bbb-3(b)(1), unless the authorization is terminated or revoked.     Resp Syncytial Virus by PCR NEGATIVE NEGATIVE Final    Comment: (NOTE) Fact Sheet for Patients: bloggercourse.com  Fact Sheet for Healthcare Providers: seriousbroker.it  This test is not yet approved or cleared by the [place]  FDA and has been authorized for detection and/or diagnosis of SARS-CoV-2 by FDA under an Emergency Use Authorization (EUA). This EUA will remain in effect (meaning this test can be used) for the duration of the COVID-19 declaration under Section 564(b)(1) of the Act, 21 U.S.C. section 360bbb-3(b)(1), unless the authorization is terminated or revoked.  Performed at [facility], [address], [place], [place] [address]   Culture, blood (Routine X 2) w Reflex to ID Panel     Status: None   Collection Time: 10/29/24  6:50 AM   Specimen: BLOOD  Result Value Ref Range Status   Specimen Description BLOOD LEFT ANTECUBITAL  Final   Special Requests   Final    BOTTLES DRAWN AEROBIC AND ANAEROBIC Blood Culture adequate volume   Culture   Final    NO GROWTH 5 DAYS Performed at [facility], [address], [place], [place] [address]    Report Status 11/03/2024 FINAL  Final  Culture, blood (Routine X 2) w Reflex to ID Panel     Status: None   Collection Time: 10/29/24  6:52 AM   Specimen: BLOOD LEFT ARM   Result Value Ref Range Status   Specimen Description BLOOD LEFT ARM BLOOD  Final   Special Requests   Final    BOTTLES DRAWN AEROBIC AND ANAEROBIC Blood Culture adequate volume   Culture   Final    NO GROWTH 5 DAYS Performed at [facility], [address] [place], [place] [address]    Report Status 11/03/2024 FINAL  Final  Expectorated Sputum Assessment w Gram Stain, Rflx to Resp Cult     Status: None   Collection Time: 10/29/24 12:50 PM   Specimen: Sputum  Result Value Ref Range Status   Specimen Description SPU  Final   Special Requests NONE  Final   Sputum evaluation   Final    THIS SPECIMEN IS ACCEPTABLE FOR SPUTUM CULTURE Performed at [facility], [address] [place], [place] [address]    Report Status 10/29/2024 FINAL  Final  Culture, Respiratory w Gram Stain     Status: None   Collection Time: 10/29/24 12:50 PM   Specimen: Sputum  Result Value Ref Range Status   Specimen Description   Final    SPU Performed at [facility], [address], [place], [place] [address]    Special Requests   Final    NONE Reflexed from [id number] Performed at [facility], [address] [place], [place] [address]    Gram Stain   Final    NO WBC SEEN FEW GRAM POSITIVE COCCI RARE GRAM NEGATIVE RODS    Culture   Final    FEW Normal respiratory flora-no Staph aureus or Pseudomonas seen Performed at [facility], [address], [place], [place] [address]    Report Status 10/31/2024 FINAL  Final  Gram stain     Status: None   Collection Time: 10/30/24  9:45 AM   Specimen: Pleura  Result Value Ref Range Status   Specimen Description PLEURAL  Final   Special Requests NONE  Final   Gram Stain   Final    NO ORGANISMS SEEN WBC PRESENT,BOTH PMN AND MONONUCLEAR CYTOSPIN SMEAR Performed at [facility], [address] [place], [place] [address]    Report Status 10/30/2024 FINAL  Final  Culture, body fluid w Gram Stain-bottle     Status: None   Collection Time: 10/30/24  9:45 AM    Specimen: Pleura  Result Value Ref Range Status   Specimen Description PLEURAL BOTTLES DRAWN AEROBIC AND ANAEROBIC  Final   Special Requests 10CC  Final   Culture   Final    NO GROWTH 5 DAYS Performed at [facility], [address], [place], [place] [address]    Report Status 11/04/2024 FINAL  Final  Culture, body fluid w Gram Stain-bottle     Status: None   Collection Time: 11/02/24  9:55 AM   Specimen: Pleura  Result Value Ref Range Status   Specimen Description PLEURAL  Final   Special Requests NONE  Final   Culture   Final    NO GROWTH 5 DAYS Performed at [facility], [address] [place], [place] [address]    Report Status 11/07/2024 FINAL  Final  Gram stain     Status: None   Collection Time: 11/02/24  9:55 AM   Specimen: Pleura  Result Value Ref Range Status   Specimen Description PLEURAL  Final   Special Requests NONE  Final   Gram Stain   Final    RARE WBC SEEN NO ORGANISMS SEEN Performed at [facility], [address] [place], [place] [address]    Report Status 11/02/2024 FINAL  Final     Labs: CBC: Recent Labs  Lab 11/03/24 0636 11/04/24 0303 11/05/24 0404 11/05/24 1230 11/06/24 0505 11/07/24 0034  WBC 12.2* 12.0* 14.0*  --  14.3* 11.8*  HGB 7.5* 7.2* 6.8* 9.6* 9.5* 9.1*  HCT 24.9* 23.8* 22.9* 30.5* 30.6* 29.5*  MCV  72.8* 73.0* 73.9*  --  75.9* 75.4*  PLT 569* 555* 663*  --  641* 628*    Basic Metabolic Panel: Recent Labs  Lab 11/01/24 0446 11/02/24 0616 11/03/24 0636 11/05/24 0404 11/06/24 0505 11/07/24 0034  NA 139 138 134*  --  133* 129*  K 3.5 3.4* 3.9  --  5.1 5.0  CL 100 99 98  --  96* 91*  CO2 32 29 26  --  30 31  GLUCOSE 160* 79 99  --  90 126*  BUN 22 26* 24*  --  20 25*  CREATININE 0.71 0.71 0.62 0.65 0.70 0.80  CALCIUM  8.1* 8.3* 8.0*  --  8.7* 8.5*  MG  --  1.3* 2.1  --   --   --     Liver Function Tests: Recent Labs  Lab 11/02/24 0616 11/03/24 0636 11/06/24 0505 11/07/24 0034  AST 75* 42* 134*  132*  ALT 82* 64* 132* 169*  ALKPHOS 115 108 119 113  BILITOT <0.2 <0.2 <0.2 <0.2  PROT 5.6* 5.5* 5.9* 5.8*  ALBUMIN 2.9* 2.8* 3.3* 3.2*        Time coordinating discharge: 45 minutes  SIGNED:  Trenda Mar, MD,  FACP, [facility], [facility], [facility]   [facility] [place]    To contact the attending provider between 7A-7P or the covering provider during after hours 7P-7A, please log into the web site [email or web address] and access using universal [place] password for that web site. If you do not have the password, please call the hospital operator.    [1]  Allergies Allergen Reactions   Augmentin [Amoxicillin-Pot Clavulanate] Other (See Comments)    ABDONIMAL PAIN    Clavulanic Acid Hives   Codeine Hives and Other (See Comments)   Alprazolam Other (See Comments)    OVER SEDATES    Bee Venom Hives   Sulfa Antibiotics Rash and Other (See Comments)   Tramadol      Insomnia    Avelox [Moxifloxacin] Rash   Hydrocodone-Acetaminophen  Other (See Comments)    Insomnia

## 2024-11-08 NOTE — Telephone Encounter (Signed)
 ok to schedule this patient in a 15 minute slot for hospital f/u in 2-4 weeks pls

## 2024-11-08 NOTE — Telephone Encounter (Signed)
 Patient scheduled.

## 2024-11-13 ENCOUNTER — Telehealth: Payer: Self-pay | Admitting: Gastroenterology

## 2024-11-13 NOTE — Telephone Encounter (Signed)
 Incoming call from pt regarding gi symptom. Pt stated she is experiencing gas and requesting medical advice. Please advise. Thank you.

## 2024-11-13 NOTE — Telephone Encounter (Signed)
 Spoke with patient. Nausea, vomiting with meals. Complains of lots of gas under my ribs. She will start taking her pantoprazole  every morning 30 minutes before she eats her morning meal. Appointment 11/20/24.

## 2024-11-15 ENCOUNTER — Ambulatory Visit: Admitting: Gastroenterology

## 2024-11-20 ENCOUNTER — Ambulatory Visit: Admitting: Physician Assistant

## 2024-12-01 ENCOUNTER — Ambulatory Visit: Admitting: Physician Assistant

## 2024-12-05 ENCOUNTER — Inpatient Hospital Stay: Admitting: Pulmonary Disease

## 2024-12-19 ENCOUNTER — Ambulatory Visit: Admitting: Vascular Surgery
